# Patient Record
Sex: Female | Born: 1935 | Race: White | Hispanic: No | Smoking: Former smoker
Health system: Southern US, Community
[De-identification: ages and names within clinical notes are randomized; demographics above are authoritative.]

## PROBLEM LIST (undated history)

## (undated) DIAGNOSIS — K8020 Calculus of gallbladder without cholecystitis without obstruction: Secondary | ICD-10-CM

## (undated) DIAGNOSIS — K5792 Diverticulitis of intestine, part unspecified, without perforation or abscess without bleeding: Secondary | ICD-10-CM

## (undated) DIAGNOSIS — D519 Vitamin B12 deficiency anemia, unspecified: Secondary | ICD-10-CM

## (undated) DIAGNOSIS — G479 Sleep disorder, unspecified: Secondary | ICD-10-CM

## (undated) DIAGNOSIS — K449 Diaphragmatic hernia without obstruction or gangrene: Secondary | ICD-10-CM

## (undated) DIAGNOSIS — M1990 Unspecified osteoarthritis, unspecified site: Secondary | ICD-10-CM

## (undated) DIAGNOSIS — E785 Hyperlipidemia, unspecified: Secondary | ICD-10-CM

## (undated) DIAGNOSIS — K635 Polyp of colon: Secondary | ICD-10-CM

## (undated) DIAGNOSIS — G629 Polyneuropathy, unspecified: Secondary | ICD-10-CM

## (undated) HISTORY — DX: Diaphragmatic hernia without obstruction or gangrene: K44.9

## (undated) HISTORY — DX: Hyperlipidemia, unspecified: E78.5

## (undated) HISTORY — DX: Polyp of colon: K63.5

## (undated) HISTORY — DX: Diverticulitis of intestine, part unspecified, without perforation or abscess without bleeding: K57.92

## (undated) HISTORY — DX: Unspecified osteoarthritis, unspecified site: M19.90

## (undated) HISTORY — DX: Calculus of gallbladder without cholecystitis without obstruction: K80.20

## (undated) HISTORY — PX: CATARACT EXTRACTION: SUR2

## (undated) HISTORY — PX: BELPHAROPTOSIS REPAIR: SHX369

## (undated) HISTORY — DX: Sleep disorder, unspecified: G47.9

## (undated) HISTORY — DX: Polyneuropathy, unspecified: G62.9

## (undated) HISTORY — DX: Vitamin B12 deficiency anemia, unspecified: D51.9

## (undated) HISTORY — PX: ABDOMINAL HYSTERECTOMY: SHX81

---

## 1983-09-25 HISTORY — PX: BREAST BIOPSY: SHX20

## 1988-03-26 HISTORY — PX: FOOT SURGERY: SHX648

## 1994-06-26 LAB — CONVERTED CEMR LAB: Pap Smear: NORMAL

## 1996-06-26 DIAGNOSIS — G629 Polyneuropathy, unspecified: Secondary | ICD-10-CM

## 1996-06-26 HISTORY — DX: Polyneuropathy, unspecified: G62.9

## 2001-03-26 LAB — HM COLONOSCOPY: HM Colonoscopy: NORMAL

## 2001-06-26 HISTORY — PX: OTHER SURGICAL HISTORY: SHX169

## 2001-08-09 ENCOUNTER — Encounter: Payer: Self-pay | Admitting: Obstetrics and Gynecology

## 2001-08-12 ENCOUNTER — Encounter: Payer: Self-pay | Admitting: Obstetrics and Gynecology

## 2001-08-12 ENCOUNTER — Ambulatory Visit (HOSPITAL_COMMUNITY): Admission: RE | Admit: 2001-08-12 | Discharge: 2001-08-12 | Payer: Self-pay | Admitting: Obstetrics and Gynecology

## 2001-08-15 ENCOUNTER — Inpatient Hospital Stay (HOSPITAL_COMMUNITY): Admission: RE | Admit: 2001-08-15 | Discharge: 2001-08-17 | Payer: Self-pay | Admitting: Obstetrics and Gynecology

## 2001-08-15 ENCOUNTER — Encounter (INDEPENDENT_AMBULATORY_CARE_PROVIDER_SITE_OTHER): Payer: Self-pay | Admitting: Specialist

## 2005-05-12 ENCOUNTER — Ambulatory Visit: Payer: Self-pay | Admitting: Internal Medicine

## 2005-10-06 ENCOUNTER — Ambulatory Visit: Payer: Self-pay | Admitting: Internal Medicine

## 2005-10-17 ENCOUNTER — Encounter: Admission: RE | Admit: 2005-10-17 | Discharge: 2005-10-17 | Payer: Self-pay | Admitting: Surgery

## 2005-10-24 ENCOUNTER — Ambulatory Visit: Payer: Self-pay | Admitting: Internal Medicine

## 2005-10-30 ENCOUNTER — Ambulatory Visit: Payer: Self-pay

## 2005-11-03 ENCOUNTER — Ambulatory Visit: Payer: Self-pay | Admitting: Internal Medicine

## 2005-12-08 ENCOUNTER — Encounter (INDEPENDENT_AMBULATORY_CARE_PROVIDER_SITE_OTHER): Payer: Self-pay | Admitting: Specialist

## 2005-12-08 ENCOUNTER — Inpatient Hospital Stay (HOSPITAL_COMMUNITY): Admission: RE | Admit: 2005-12-08 | Discharge: 2005-12-08 | Payer: Self-pay | Admitting: Neurological Surgery

## 2006-01-02 ENCOUNTER — Ambulatory Visit: Payer: Self-pay | Admitting: Internal Medicine

## 2006-01-09 ENCOUNTER — Ambulatory Visit: Payer: Self-pay | Admitting: Internal Medicine

## 2006-01-16 ENCOUNTER — Ambulatory Visit: Payer: Self-pay | Admitting: Internal Medicine

## 2006-01-23 ENCOUNTER — Ambulatory Visit: Payer: Self-pay | Admitting: Internal Medicine

## 2006-02-06 ENCOUNTER — Ambulatory Visit: Payer: Self-pay | Admitting: Internal Medicine

## 2006-02-20 ENCOUNTER — Ambulatory Visit: Payer: Self-pay | Admitting: Internal Medicine

## 2006-03-20 ENCOUNTER — Ambulatory Visit: Payer: Self-pay | Admitting: Internal Medicine

## 2006-04-17 ENCOUNTER — Ambulatory Visit: Payer: Self-pay | Admitting: Internal Medicine

## 2006-05-11 ENCOUNTER — Ambulatory Visit: Payer: Self-pay | Admitting: Internal Medicine

## 2006-05-11 LAB — CONVERTED CEMR LAB
Hgb A1c MFr Bld: 5.9 % (ref 4.6–6.0)
Vitamin B-12: >1500 pg/mL — ABNORMAL HIGH (ref 211–911)

## 2006-06-11 ENCOUNTER — Ambulatory Visit: Payer: Self-pay | Admitting: Internal Medicine

## 2006-06-12 ENCOUNTER — Ambulatory Visit: Payer: Self-pay | Admitting: Internal Medicine

## 2006-06-26 LAB — CONVERTED CEMR LAB: Pap Smear: NORMAL

## 2006-07-10 ENCOUNTER — Ambulatory Visit: Payer: Self-pay | Admitting: Internal Medicine

## 2006-08-07 ENCOUNTER — Ambulatory Visit: Payer: Self-pay | Admitting: Internal Medicine

## 2006-10-09 ENCOUNTER — Ambulatory Visit: Payer: Self-pay | Admitting: Internal Medicine

## 2006-10-09 LAB — CONVERTED CEMR LAB
BUN: 14 mg/dL (ref 6–23)
CO2: 29 meq/L (ref 19–32)
Calcium: 9.5 mg/dL (ref 8.4–10.5)
Chloride: 109 meq/L (ref 96–112)
Cholesterol: 173 mg/dL (ref 0–200)
Creatinine, Ser: 0.8 mg/dL (ref 0.4–1.2)
Direct LDL: 99.2 mg/dL
GFR calc Af Amer: 91 mL/min
GFR calc non Af Amer: 75 mL/min
Glucose, Bld: 116 mg/dL — ABNORMAL HIGH (ref 70–99)
HDL: 39.8 mg/dL (ref >39.0)
Hemoglobin: 13.9 g/dL (ref 12.0–15.0)
Hgb A1c MFr Bld: 6.6 % — ABNORMAL HIGH (ref 4.6–6.0)
Potassium: 4.7 meq/L (ref 3.5–5.1)
Sed Rate: 10 mm/hr (ref 0–25)
Sodium: 142 meq/L (ref 135–145)
Total CHOL/HDL Ratio: 4.3
Triglycerides: 215 mg/dL (ref 0–149)
VLDL: 43 mg/dL — ABNORMAL HIGH (ref 0–40)

## 2006-11-06 ENCOUNTER — Ambulatory Visit: Payer: Self-pay | Admitting: Internal Medicine

## 2006-12-04 ENCOUNTER — Ambulatory Visit: Payer: Self-pay | Admitting: Internal Medicine

## 2007-01-01 ENCOUNTER — Ambulatory Visit: Payer: Self-pay | Admitting: Internal Medicine

## 2007-02-06 ENCOUNTER — Ambulatory Visit: Payer: Self-pay | Admitting: Internal Medicine

## 2007-02-06 DIAGNOSIS — G4700 Insomnia, unspecified: Secondary | ICD-10-CM

## 2007-02-06 DIAGNOSIS — E119 Type 2 diabetes mellitus without complications: Secondary | ICD-10-CM

## 2007-02-08 ENCOUNTER — Encounter (INDEPENDENT_AMBULATORY_CARE_PROVIDER_SITE_OTHER): Payer: Self-pay | Admitting: *Deleted

## 2007-02-08 LAB — CONVERTED CEMR LAB
Creatinine,U: 33.4 mg/dL
Hgb A1c MFr Bld: 6.3 % — ABNORMAL HIGH (ref 4.6–6.0)
Microalb Creat Ratio: 6 mg/g (ref 0.0–30.0)
Microalb, Ur: 0.2 mg/dL (ref 0.0–1.9)

## 2007-03-06 ENCOUNTER — Ambulatory Visit: Payer: Self-pay | Admitting: Internal Medicine

## 2007-04-03 ENCOUNTER — Ambulatory Visit: Payer: Self-pay | Admitting: Internal Medicine

## 2007-04-22 ENCOUNTER — Encounter: Payer: Self-pay | Admitting: Internal Medicine

## 2007-05-03 ENCOUNTER — Ambulatory Visit: Payer: Self-pay | Admitting: Internal Medicine

## 2007-05-15 ENCOUNTER — Ambulatory Visit: Payer: Self-pay | Admitting: Internal Medicine

## 2007-05-16 LAB — CONVERTED CEMR LAB
Cholesterol: 193 mg/dL (ref 0–200)
Direct LDL: 114.1 mg/dL
HDL: 39.5 mg/dL (ref >39.0)
Hgb A1c MFr Bld: 6.4 % — ABNORMAL HIGH (ref 4.6–6.0)
Total CHOL/HDL Ratio: 4.9
Triglycerides: 226 mg/dL (ref 0–149)
VLDL: 45 mg/dL — ABNORMAL HIGH (ref 0–40)

## 2007-05-31 ENCOUNTER — Ambulatory Visit: Payer: Self-pay | Admitting: Internal Medicine

## 2007-07-03 ENCOUNTER — Ambulatory Visit: Payer: Self-pay | Admitting: Internal Medicine

## 2007-07-29 ENCOUNTER — Ambulatory Visit: Payer: Self-pay | Admitting: Internal Medicine

## 2007-07-30 ENCOUNTER — Encounter: Payer: Self-pay | Admitting: Internal Medicine

## 2007-08-20 ENCOUNTER — Ambulatory Visit: Payer: Self-pay | Admitting: Internal Medicine

## 2007-08-20 DIAGNOSIS — G629 Polyneuropathy, unspecified: Secondary | ICD-10-CM

## 2007-08-20 DIAGNOSIS — M1990 Unspecified osteoarthritis, unspecified site: Secondary | ICD-10-CM | POA: Insufficient documentation

## 2007-08-20 DIAGNOSIS — D518 Other vitamin B12 deficiency anemias: Secondary | ICD-10-CM

## 2007-08-20 DIAGNOSIS — K5900 Constipation, unspecified: Secondary | ICD-10-CM | POA: Insufficient documentation

## 2007-08-22 LAB — CONVERTED CEMR LAB
AST: 23 units/L (ref 0–37)
BUN: 16 mg/dL (ref 6–23)
Basophils Absolute: 0.1 10*3/uL (ref 0.0–0.1)
CO2: 31 meq/L (ref 19–32)
Cholesterol: 204 mg/dL (ref 0–200)
Eosinophils Absolute: 0.2 10*3/uL (ref 0.0–0.6)
Folate: >20 ng/mL
GFR calc Af Amer: 90 mL/min
HDL: 39.1 mg/dL (ref >39.0)
MCHC: 31.9 g/dL (ref 30.0–36.0)
MCV: 86.3 fL (ref 78.0–100.0)
Microalb, Ur: 0.2 mg/dL (ref 0.0–1.9)
Monocytes Relative: 8.7 % (ref 3.0–11.0)
Neutrophils Relative %: 65.8 % (ref 43.0–77.0)
Platelets: 226 10*3/uL (ref 150–400)
Potassium: 4.2 meq/L (ref 3.5–5.1)
RBC: 4.98 M/uL (ref 3.87–5.11)
Triglycerides: 262 mg/dL (ref 0–149)

## 2007-08-27 ENCOUNTER — Ambulatory Visit: Payer: Self-pay | Admitting: Internal Medicine

## 2007-10-08 ENCOUNTER — Ambulatory Visit: Payer: Self-pay | Admitting: Internal Medicine

## 2007-11-05 ENCOUNTER — Ambulatory Visit: Payer: Self-pay | Admitting: Internal Medicine

## 2007-11-13 ENCOUNTER — Ambulatory Visit: Payer: Self-pay | Admitting: Internal Medicine

## 2007-11-15 ENCOUNTER — Telehealth (INDEPENDENT_AMBULATORY_CARE_PROVIDER_SITE_OTHER): Payer: Self-pay | Admitting: *Deleted

## 2007-12-18 ENCOUNTER — Ambulatory Visit: Payer: Self-pay | Admitting: Internal Medicine

## 2007-12-23 ENCOUNTER — Telehealth (INDEPENDENT_AMBULATORY_CARE_PROVIDER_SITE_OTHER): Payer: Self-pay | Admitting: *Deleted

## 2007-12-23 LAB — CONVERTED CEMR LAB
Cholesterol: 224 mg/dL (ref 0–200)
Total CHOL/HDL Ratio: 6
Triglycerides: 173 mg/dL — ABNORMAL HIGH (ref 0–149)

## 2008-01-15 ENCOUNTER — Ambulatory Visit: Payer: Self-pay | Admitting: Internal Medicine

## 2008-02-20 ENCOUNTER — Ambulatory Visit: Payer: Self-pay | Admitting: Internal Medicine

## 2008-03-16 ENCOUNTER — Ambulatory Visit: Payer: Self-pay | Admitting: Internal Medicine

## 2008-03-26 LAB — HM MAMMOGRAPHY: HM Mammogram: NORMAL

## 2008-04-22 ENCOUNTER — Ambulatory Visit: Payer: Self-pay | Admitting: Internal Medicine

## 2008-04-22 DIAGNOSIS — E785 Hyperlipidemia, unspecified: Secondary | ICD-10-CM

## 2008-04-28 ENCOUNTER — Telehealth (INDEPENDENT_AMBULATORY_CARE_PROVIDER_SITE_OTHER): Payer: Self-pay | Admitting: *Deleted

## 2008-04-28 LAB — CONVERTED CEMR LAB
ALT: 19 U/L (ref 0–35)
AST: 21 U/L (ref 0–37)
Cholesterol: 142 mg/dL (ref 0–200)
Folate: 19.6 ng/mL
HDL: 35.5 mg/dL — ABNORMAL LOW (ref >39.0)
Hgb A1c MFr Bld: 6.8 % — ABNORMAL HIGH (ref 4.6–6.0)
LDL Cholesterol: 69 mg/dL (ref 0–99)
Total CHOL/HDL Ratio: 4
Triglycerides: 187 mg/dL — ABNORMAL HIGH (ref 0–149)
VLDL: 37 mg/dL (ref 0–40)
Vitamin B-12: >1500 pg/mL — ABNORMAL HIGH (ref 211–911)

## 2008-05-14 ENCOUNTER — Encounter: Payer: Self-pay | Admitting: Internal Medicine

## 2008-05-28 ENCOUNTER — Ambulatory Visit: Payer: Self-pay | Admitting: Internal Medicine

## 2008-06-16 ENCOUNTER — Encounter: Payer: Self-pay | Admitting: Internal Medicine

## 2008-06-23 ENCOUNTER — Ambulatory Visit: Payer: Self-pay | Admitting: Internal Medicine

## 2008-07-21 ENCOUNTER — Ambulatory Visit: Payer: Self-pay | Admitting: Internal Medicine

## 2008-08-19 ENCOUNTER — Encounter: Payer: Self-pay | Admitting: Internal Medicine

## 2008-08-20 ENCOUNTER — Ambulatory Visit: Payer: Self-pay | Admitting: Internal Medicine

## 2008-08-25 ENCOUNTER — Telehealth (INDEPENDENT_AMBULATORY_CARE_PROVIDER_SITE_OTHER): Payer: Self-pay | Admitting: *Deleted

## 2008-08-25 LAB — CONVERTED CEMR LAB
ALT: 27 units/L (ref 0–35)
BUN: 13 mg/dL (ref 6–23)
CO2: 27 meq/L (ref 19–32)
Chloride: 107 meq/L (ref 96–112)
Glucose, Bld: 147 mg/dL — ABNORMAL HIGH (ref 70–99)
Hgb A1c MFr Bld: 6.9 % — ABNORMAL HIGH (ref 4.6–6.0)
Potassium: 4 meq/L (ref 3.5–5.1)

## 2008-09-17 ENCOUNTER — Ambulatory Visit: Payer: Self-pay | Admitting: Internal Medicine

## 2008-10-13 ENCOUNTER — Ambulatory Visit: Payer: Self-pay | Admitting: Internal Medicine

## 2008-12-22 ENCOUNTER — Ambulatory Visit: Payer: Self-pay | Admitting: Internal Medicine

## 2008-12-22 DIAGNOSIS — D239 Other benign neoplasm of skin, unspecified: Secondary | ICD-10-CM | POA: Insufficient documentation

## 2008-12-25 LAB — CONVERTED CEMR LAB
Basophils Relative: 0.2 % (ref 0.0–3.0)
Eosinophils Absolute: 0.1 10*3/uL (ref 0.0–0.7)
Eosinophils Relative: 1.6 % (ref 0.0–5.0)
LDL Cholesterol: 59 mg/dL (ref 0–99)
Lymphocytes Relative: 20.7 % (ref 12.0–46.0)
MCHC: 33.8 g/dL (ref 30.0–36.0)
Neutrophils Relative %: 68.3 % (ref 43.0–77.0)
Platelets: 174 10*3/uL (ref 150.0–400.0)
RBC: 4.73 M/uL (ref 3.87–5.11)
Total CHOL/HDL Ratio: 4
VLDL: 38.6 mg/dL (ref 0.0–40.0)
WBC: 5.3 10*3/uL (ref 4.5–10.5)

## 2009-01-06 ENCOUNTER — Ambulatory Visit: Payer: Self-pay | Admitting: Internal Medicine

## 2009-01-06 ENCOUNTER — Encounter: Payer: Self-pay | Admitting: Internal Medicine

## 2009-01-19 ENCOUNTER — Ambulatory Visit: Payer: Self-pay | Admitting: Internal Medicine

## 2009-02-11 ENCOUNTER — Encounter (INDEPENDENT_AMBULATORY_CARE_PROVIDER_SITE_OTHER): Payer: Self-pay | Admitting: *Deleted

## 2009-02-11 ENCOUNTER — Ambulatory Visit: Payer: Self-pay | Admitting: Internal Medicine

## 2009-04-14 ENCOUNTER — Ambulatory Visit: Payer: Self-pay | Admitting: Internal Medicine

## 2009-05-11 ENCOUNTER — Ambulatory Visit: Payer: Self-pay | Admitting: Internal Medicine

## 2009-05-25 ENCOUNTER — Ambulatory Visit: Payer: Self-pay | Admitting: Internal Medicine

## 2009-05-27 LAB — CONVERTED CEMR LAB
BUN: 12 mg/dL (ref 6–23)
Chloride: 105 meq/L (ref 96–112)
Creatinine, Ser: 0.7 mg/dL (ref 0.4–1.2)
Folate: >20 ng/mL
GFR calc non Af Amer: 86.57 mL/min (ref >60)
Hgb A1c MFr Bld: 6.9 % — ABNORMAL HIGH (ref 4.6–6.5)
Vitamin B-12: 416 pg/mL (ref 211–911)

## 2009-06-08 ENCOUNTER — Ambulatory Visit: Payer: Self-pay | Admitting: Internal Medicine

## 2009-07-09 ENCOUNTER — Ambulatory Visit: Payer: Self-pay | Admitting: Internal Medicine

## 2009-08-10 ENCOUNTER — Ambulatory Visit: Payer: Self-pay | Admitting: Internal Medicine

## 2009-09-07 ENCOUNTER — Ambulatory Visit: Payer: Self-pay | Admitting: Internal Medicine

## 2009-10-12 ENCOUNTER — Ambulatory Visit: Payer: Self-pay | Admitting: Internal Medicine

## 2009-10-19 LAB — CONVERTED CEMR LAB
ALT: 18 units/L (ref 0–35)
AST: 23 units/L (ref 0–37)
Cholesterol: 159 mg/dL (ref 0–200)
Creatinine,U: 112.5 mg/dL
Direct LDL: 79.2 mg/dL
Microalb Creat Ratio: 6.2 mg/g (ref 0.0–30.0)
TSH: 1.5 microintl units/mL (ref 0.35–5.50)
Total CHOL/HDL Ratio: 3
Triglycerides: 256 mg/dL — ABNORMAL HIGH (ref 0.0–149.0)

## 2009-11-15 ENCOUNTER — Ambulatory Visit: Payer: Self-pay | Admitting: Internal Medicine

## 2009-12-07 ENCOUNTER — Encounter: Payer: Self-pay | Admitting: Internal Medicine

## 2009-12-21 ENCOUNTER — Ambulatory Visit: Payer: Self-pay | Admitting: Internal Medicine

## 2010-01-25 ENCOUNTER — Ambulatory Visit: Payer: Self-pay | Admitting: Internal Medicine

## 2010-02-16 ENCOUNTER — Ambulatory Visit: Payer: Self-pay | Admitting: Internal Medicine

## 2010-02-21 LAB — CONVERTED CEMR LAB
BUN: 12 mg/dL (ref 6–23)
Basophils Absolute: 0 10*3/uL (ref 0.0–0.1)
Calcium: 9.1 mg/dL (ref 8.4–10.5)
Eosinophils Relative: 3.2 % (ref 0.0–5.0)
Folate: 19.7 ng/mL
GFR calc non Af Amer: 107.34 mL/min (ref >60)
Glucose, Bld: 121 mg/dL — ABNORMAL HIGH (ref 70–99)
Hgb A1c MFr Bld: 6.9 % — ABNORMAL HIGH (ref 4.6–6.5)
Lymphocytes Relative: 21.5 % (ref 12.0–46.0)
Monocytes Relative: 8.2 % (ref 3.0–12.0)
Neutrophils Relative %: 66.8 % (ref 43.0–77.0)
Platelets: 174 10*3/uL (ref 150.0–400.0)
RDW: 14.6 % (ref 11.5–14.6)
Vitamin B-12: 427 pg/mL (ref 211–911)
WBC: 4.9 10*3/uL (ref 4.5–10.5)

## 2010-03-08 ENCOUNTER — Ambulatory Visit: Payer: Self-pay | Admitting: Internal Medicine

## 2010-03-08 DIAGNOSIS — R197 Diarrhea, unspecified: Secondary | ICD-10-CM

## 2010-03-08 LAB — CONVERTED CEMR LAB
Glucose, Urine, Semiquant: NEGATIVE
Protein, U semiquant: NEGATIVE
Specific Gravity, Urine: 1.025
WBC Urine, dipstick: NEGATIVE
pH: 6.5

## 2010-04-13 ENCOUNTER — Ambulatory Visit: Payer: Self-pay | Admitting: Internal Medicine

## 2010-05-12 ENCOUNTER — Ambulatory Visit: Payer: Self-pay | Admitting: Internal Medicine

## 2010-05-31 ENCOUNTER — Encounter: Payer: Self-pay | Admitting: Internal Medicine

## 2010-06-13 ENCOUNTER — Ambulatory Visit: Payer: Self-pay | Admitting: Internal Medicine

## 2010-07-26 ENCOUNTER — Ambulatory Visit
Admission: RE | Admit: 2010-07-26 | Discharge: 2010-07-26 | Payer: Self-pay | Source: Home / Self Care | Attending: Internal Medicine | Admitting: Internal Medicine

## 2010-07-26 ENCOUNTER — Other Ambulatory Visit: Payer: Self-pay | Admitting: Internal Medicine

## 2010-07-26 ENCOUNTER — Encounter: Payer: Self-pay | Admitting: Internal Medicine

## 2010-07-26 LAB — CONVERTED CEMR LAB: Vit D, 25-Hydroxy: 60 ng/mL (ref 30–89)

## 2010-07-26 NOTE — Assessment & Plan Note (Signed)
Summary: 4 month roa//lch  resch/cbs   Vital Signs:  Patient profile:   77 year old female Weight:      158.13 pounds Pulse rate:   83 / minute Pulse rhythm:   regular BP sitting:   132 / 82  (left arm) Cuff size:   regular  Vitals Entered By: Army Fossa CMA (February 16, 2010 10:42 AM) CC: F/u visit: fasting    History of Present Illness: ROV AODM -- diet only, ambulatory CBGs in AM 130 to 150, rest of the day CBGs are 100, rarely more    NEUROPATHY --pt declined a nerve BX, had a NCS last week. neurology  refered to PT to use a cane and work in balance . on vicodin 2/day on average   Hyperlipidemia-- good medication compliance , needs Rx faxed to [facility]  Current Medications (verified): 1)  Provigil 200 Mg  Tabs (Modafinil) .Marland Kitchen.. 1 By Mouth Qam 2)  Cymbalta 60 Mg Cpep (Duloxetine Hcl) .... Take 2 Capsule By Mouth Once A Day 3)  Ambien 10 Mg Tabs (Zolpidem Tartrate) .... 1/2 Qhs 4)  Neurontin 600 Mg Tabs (Gabapentin) .... Qid (Rx'd By Neuro) 5)  Bayer Aspirin 325 Mg Tabs (Aspirin) .... Take 1 Tablet By Mouth Once A Day 6)  Hydrocodone-Acetaminophen 5-500 Mg  Tabs (Hydrocodone-Acetaminophen) .... Qhs 7)  Mvi - Calcium 8)  B 12 Shots Monthly 9)  Test Strips .... Checks Bs 1x Once Daily Dx 250.00 10)  Pravachol 20 Mg  Tabs (Pravastatin Sodium) .Marland Kitchen.. 1 By Mouth Qhs  Allergies: 1)  ! Sulfa  Past History:  Past Medical History: DISTURBANCE, SLEEP ----> Narcolepsy AODM  B 12 def  ANEMIA NEUROPATHY Dx 1998, not related to DM, w/u neg (idiopatic Progressive Neuropathy) ---f/u [facility]  2005: (-) CAROTID U/S AND CARDIOLITE Osteoarthritis Hyperlipidemia, started meds 6-09 DEXA normal 7-10   Past Surgical History: Reviewed history from 12/22/2008 and no changes required. Hysterectomy, no oophorectomy  L cataract surgery 9-09 R cataract surgery 3/10  Social History: Reviewed history from 10/12/2009 and no changes required. Married x 56 years children x  2 Former Smoker Alcohol use-yes wine 3-4x/wk Drug use-no Regular exercise-- increasingly diff. to exercise d/t lack of balance (neuropathy) caffeine - 4 cups once daily still drives  eats healthy  Review of Systems CV:  Denies chest pain or discomfort; occasionally ankle edema . Resp:  Denies cough and shortness of breath. GI:  Denies bloody stools, diarrhea, nausea, and vomiting. MS:  h/o DJD, R hand pain . Marland Kitchen  Physical Exam  General:  alert, well-developed, and well-nourished.   Lungs:  normal respiratory effort, no intercostal retractions, no accessory muscle use, and normal breath sounds.   Heart:  normal rate, regular rhythm, no murmur, and no gallop.   Extremities:  no pretibial edema bilaterally  hand and wrist is without puffiness. Fingers with deformities consistent with DJD Psych:  Oriented X3, memory intact for recent and remote, normally interactive, good eye contact, not anxious appearing, and not depressed appearing.     Impression & Recommendations:  Problem # 1:  HYPERLIPIDEMIA (ICD-272.4) well-controlled except for triglycerides, recommend diet Her updated medication list for this problem includes:    Pravachol 20 Mg Tabs (Pravastatin sodium) .Marland Kitchen... 1 by mouth qhs  Labs Reviewed: SGOT: 23 (10/12/2009)   SGPT: 18 (10/12/2009)   HDL:46.70 (10/12/2009), 36.10 (12/22/2008)  LDL:59 (12/22/2008), 69 (04/22/2008)  Chol:159 (10/12/2009), 134 (12/22/2008)  Trig:256.0 (10/12/2009), 193.0 (12/22/2008)  Problem # 2:  NEUROPATHY (ICD-355.9) pt declined  a BX, had a NCS last week. neurology  refered to PT to use a cane and work in balance . on vicodin 2/day on average   Problem # 3:  ANEMIA, B12 DEFICIENCY (ICD-281.1) labs Orders: TLB-CBC Platelet - w/Differential (85025-CBCD) TLB-B12 + Folate Pnl (14782_95621-H08/MVH) Specimen Handling (84696)  Hgb: 13.8 (12/22/2008)   Hct: 40.7 (12/22/2008)   Platelets: 174.0 (12/22/2008) RBC: 4.73 (12/22/2008)   RDW: 13.6  (12/22/2008)   WBC: 5.3 (12/22/2008) MCV: 86.0 (12/22/2008)   MCHC: 33.8 (12/22/2008) B12: 416 (05/25/2009)   Folate: >20.0 ng/mL (05/25/2009)   TSH: 1.50 (10/12/2009)  Problem # 4:  AODM (ICD-250.00) on diet only Her updated medication list for this problem includes:    Bayer Aspirin 325 Mg Tabs (Aspirin) .Marland Kitchen... Take 1 tablet by mouth once a day  Orders: Venipuncture (29528) TLB-BMP (Basic Metabolic Panel-BMET) (80048-METABOL) TLB-A1C / Hgb A1C (Glycohemoglobin) (83036-A1C) Specimen Handling (41324)  Labs Reviewed: Creat: 0.7 (05/25/2009)    Reviewed HgBA1c results: 6.6 (10/12/2009)  6.9 (05/25/2009)  Problem # 5:  like to have her physical exam on  return to the office  Complete Medication List: 1)  Provigil 200 Mg Tabs (Modafinil) .Marland Kitchen.. 1 by mouth qam 2)  Cymbalta 60 Mg Cpep (Duloxetine hcl) .... Take 2 capsule by mouth once a day 3)  Ambien 10 Mg Tabs (Zolpidem tartrate) .... 1/2 qhs 4)  Neurontin 600 Mg Tabs (Gabapentin) .... Qid (rx'd by neuro) 5)  Bayer Aspirin 325 Mg Tabs (Aspirin) .... Take 1 tablet by mouth once a day 6)  Hydrocodone-acetaminophen 5-500 Mg Tabs (Hydrocodone-acetaminophen) .... Qhs 7)  Mvi - Calcium  8)  B 12 Shots Monthly  9)  Test Strips  .... Checks bs 1x once daily dx 250.00 10)  Pravachol 20 Mg Tabs (Pravastatin sodium) .Marland Kitchen.. 1 by mouth qhs  Patient Instructions: 1)  Please schedule a follow-up appointment in 4 to 6 months , fasting, physical exam Prescriptions: PRAVACHOL 20 MG  TABS (PRAVASTATIN SODIUM) 1 by mouth qhs  #90 x 3   Entered by:   Army Fossa CMA   Authorized by:   Nolon Rod. Shawon Denzer MD   Signed by:   Army Fossa CMA on 02/16/2010   Method used:   Faxed to ...       [facility] (mail-order)             , [place]         Ph: [phone]       Fax: [phone]   RxID:   [id number]

## 2010-07-26 NOTE — Assessment & Plan Note (Signed)
Summary: b12 inj//lch  Nurse Visit   Allergies: 1)  ! Sulfa  Medication Administration  Injection # 1:    Medication: Vit B12 1000 mcg    Diagnosis: ANEMIA, B12 DEFICIENCY (ICD-281.1)    Route: IM    Site: L deltoid    Exp Date: 01/25/2011    Lot #: [id number]    Mfr: American Regent    Given by: Doristine Devoid (September 07, 2009 11:07 AM)  Orders Added: 1)  Vit B12 1000 mcg [J3420] 2)  Admin of Therapeutic Inj  intramuscular or subcutaneous [96372]   Medication Administration  Injection # 1:    Medication: Vit B12 1000 mcg    Diagnosis: ANEMIA, B12 DEFICIENCY (ICD-281.1)    Route: IM    Site: L deltoid    Exp Date: 01/25/2011    Lot #: [id number]    Mfr: American Regent    Given by: Doristine Devoid (September 07, 2009 11:07 AM)  Orders Added: 1)  Vit B12 1000 mcg [J3420] 2)  Admin of Therapeutic Inj  intramuscular or subcutaneous [53664]

## 2010-07-26 NOTE — Assessment & Plan Note (Signed)
Summary: 5 mth fu/ns/kdc//b12//lch   Vital Signs:  Patient profile:   77 year old female Height:      66.75 inches Weight:      157.6 pounds BMI:     24.96 Pulse rate:   72 / minute BP sitting:   120 / 60  Vitals Entered By: Shary Decamp (October 12, 2009 10:17 AM) CC: rov - fasting Comments c/o of losing strength in her arms Shary Decamp  October 12, 2009 10:20 AM    History of Present Illness:  rov - fasting c/o of losing strength in her arms, wonders if related to peripheral neuropathy   AODM -- check ambulatory CBGs 3/week : AM 150s, before dinner in the low 120s   B 12 def  ANEMIA-- good medication compliance w/  shots  Osteoarthritis-- not really an issue at this point   Hyperlipidemia-- good medication compliance w/  pravachol, no apparent s/e   Current Medications (verified): 1)  Provigil 200 Mg  Tabs (Modafinil) .Marland Kitchen.. 1 By Mouth Qam 2)  Cymbalta 60 Mg Cpep (Duloxetine Hcl) .... Take 2 Capsule By Mouth Once A Day 3)  Ambien 10 Mg Tabs (Zolpidem Tartrate) .... 1/2 Qhs 4)  Neurontin 600 Mg Tabs (Gabapentin) .... Qid (Rx'd By Neuro) 5)  Bayer Aspirin 325 Mg Tabs (Aspirin) .... Take 1 Tablet By Mouth Once A Day 6)  Hydrocodone-Acetaminophen 5-500 Mg  Tabs (Hydrocodone-Acetaminophen) .... Qhs 7)  Mvi - Calcium 8)  B 12 Shots Monthly 9)  Test Strips .... Checks Bs 1x Once Daily Dx 250.00 10)  Pravachol 20 Mg  Tabs (Pravastatin Sodium) .Marland Kitchen.. 1 By Mouth Qhs  Allergies (verified): 1)  ! Sulfa  Past History:  Past Medical History: DISTURBANCE, SLEEP ----> Narcolepsy AODM  B 12 def  ANEMIA NEUROPATHY Dx 1998, not related to DM, w/u neg (idiopatic Progressive Neuropathy) ---f/u [facility]  2005: (-) CAROTID U/S AND CARDIOLITE Osteoarthritis Hyperlipidemia, started meds 6-09  Past Surgical History: Reviewed history from 12/22/2008 and no changes required. Hysterectomy, no oophorectomy  L cataract surgery 9-09 R cataract surgery 3/10  Social  History: Married x 56 years children x 2 Former Smoker Alcohol use-yes wine 3-4x/wk Drug use-no Regular exercise-- increasingly diff. to exercise d/t lack of balance (neuropathy) caffeine - 4 cups once daily still drives  eats healthy  Review of Systems CV:  Denies chest pain or discomfort and swelling of feet. Resp:  Denies cough and shortness of breath. GI:  Denies bloody stools, diarrhea, nausea, and vomiting. MS:  Denies muscle aches. Neuro:  neuropathic pain remains her main concern. Psych:  Denies anxiety; occasionally feels down, mostly due to her neuropathy .  Physical Exam  General:  alert, well-developed, and well-nourished.   Lungs:  normal respiratory effort, no intercostal retractions, no accessory muscle use, and normal breath sounds.   Heart:  Normal rate and regular rhythm. S1 and S2 normal without gallop  Extremities:  no edema Psych:  not anxious appearing and not depressed appearing.     Impression & Recommendations:  Problem # 1:  HYPERLIPIDEMIA (ICD-272.4) due for labs Her updated medication list for this problem includes:    Pravachol 20 Mg Tabs (Pravastatin sodium) .Marland Kitchen... 1 by mouth qhs  Orders: TLB-Lipid Panel (80061-LIPID) TLB-ALT (SGPT) (84460-ALT) TLB-AST (SGOT) (84450-SGOT)  Labs Reviewed: SGOT: 24 (12/22/2008)   SGPT: 19 (12/22/2008)   HDL:36.10 (12/22/2008), 35.5 (04/22/2008)  LDL:59 (12/22/2008), 69 (04/22/2008)  Chol:134 (12/22/2008), 142 (04/22/2008)  Trig:193.0 (12/22/2008), 187 (04/22/2008)  Problem # 2:  ANEMIA, B12 DEFICIENCY (ICD-281.1) good compliance with shots Orders: Admin of Therapeutic Inj  intramuscular or subcutaneous (45409) Vit B12 1000 mcg (J3420)  Hgb: 13.8 (12/22/2008)   Hct: 40.7 (12/22/2008)   Platelets: 174.0 (12/22/2008) RBC: 4.73 (12/22/2008)   RDW: 13.6 (12/22/2008)   WBC: 5.3 (12/22/2008) MCV: 86.0 (12/22/2008)   MCHC: 33.8 (12/22/2008) B12: 416 (05/25/2009)   Folate: >20.0 ng/mL (05/25/2009)   TSH: 1.37  (08/20/2007)  Problem # 3:  AODM (ICD-250.00) on no medication, labs diet is healthy, exercise limited by neuropathy Her updated medication list for this problem includes:    Bayer Aspirin 325 Mg Tabs (Aspirin) .Marland Kitchen... Take 1 tablet by mouth once a day  Orders: Venipuncture (81191) TLB-A1C / Hgb A1C (Glycohemoglobin) (83036-A1C) TLB-Microalbumin/Creat Ratio, Urine (82043-MALB)  Labs Reviewed: Creat: 0.7 (05/25/2009)    Reviewed HgBA1c results: 6.9 (05/25/2009)  6.9 (12/22/2008)  Problem # 4:  NEUROPATHY (ICD-355.9) long history of idiopathic neuropathy, symptoms are mostly deep ache and burning in her legs under the care of Dr. Anne Hahn in [facility], she is her primary neurologist, she has seen also a sub-specialties in [place]  they are thinking about nerve biopsy, patient not sure. encourage to use more Vicodin, she takes maybe one a day, I don't see any objection to take one every 6 hours as needed as long as she is not drowsy.  This is  mostly to improve her quantity of life Orders: TLB-TSH (Thyroid Stimulating Hormone) (84443-TSH)  Complete Medication List: 1)  Provigil 200 Mg Tabs (Modafinil) .Marland Kitchen.. 1 by mouth qam 2)  Cymbalta 60 Mg Cpep (Duloxetine hcl) .... Take 2 capsule by mouth once a day 3)  Ambien 10 Mg Tabs (Zolpidem tartrate) .... 1/2 qhs 4)  Neurontin 600 Mg Tabs (Gabapentin) .... Qid (rx'd by neuro) 5)  Bayer Aspirin 325 Mg Tabs (Aspirin) .... Take 1 tablet by mouth once a day 6)  Hydrocodone-acetaminophen 5-500 Mg Tabs (Hydrocodone-acetaminophen) .... Qhs 7)  Mvi - Calcium  8)  B 12 Shots Monthly  9)  Test Strips  .... Checks bs 1x once daily dx 250.00 10)  Pravachol 20 Mg Tabs (Pravastatin sodium) .Marland Kitchen.. 1 by mouth qhs  Patient Instructions: 1)  Please schedule a follow-up appointment in 4 months  (yearly checkup)   Medication Administration  Injection # 1:    Medication: Vit B12 1000 mcg    Diagnosis: ANEMIA, B12 DEFICIENCY (ICD-281.1)    Route: IM     Site: L deltoid    Exp Date: 07/2011    Lot #: [id number]    Patient tolerated injection without complications    Given by: Shary Decamp (October 12, 2009 10:28 AM)  Orders Added: 1)  Admin of Therapeutic Inj  intramuscular or subcutaneous [96372] 2)  Vit B12 1000 mcg [J3420] 3)  Venipuncture [36415] 4)  TLB-Lipid Panel [80061-LIPID] 5)  TLB-A1C / Hgb A1C (Glycohemoglobin) [83036-A1C] 6)  TLB-Microalbumin/Creat Ratio, Urine [82043-MALB] 7)  TLB-TSH (Thyroid Stimulating Hormone) [84443-TSH] 8)  TLB-ALT (SGPT) [84460-ALT] 9)  TLB-AST (SGOT) [84450-SGOT] 10)  Est. Patient Level IV [47829]

## 2010-07-26 NOTE — Assessment & Plan Note (Signed)
Summary: B12//KN  Nurse Visit   Allergies: 1)  ! Sulfa  Medication Administration  Injection # 1:    Medication: Vit B12 1000 mcg    Diagnosis: ANEMIA, B12 DEFICIENCY (ICD-281.1)    Route: IM    Site: L deltoid    Exp Date: 08/2011    Lot #: [id number]    Mfr: American Regent    Patient tolerated injection without complications    Given by: Army Fossa CMA (January 25, 2010 11:00 AM)  Orders Added: 1)  Vit B12 1000 mcg [J3420] 2)  Admin of Therapeutic Inj  intramuscular or subcutaneous [65784]

## 2010-07-26 NOTE — Assessment & Plan Note (Signed)
Summary: B-12///SPH  Nurse Visit  CC: B-12 inj./kb   Allergies: 1)  ! Sulfa  Medication Administration  Injection # 1:    Medication: Vit B12 1000 mcg    Diagnosis: ANEMIA, B12 DEFICIENCY (ICD-281.1)    Route: IM    Site: L deltoid    Exp Date: 08/25/2011    Lot #: [id number]    Mfr: American Regent    Patient tolerated injection without complications    Given by: Lucious Groves CMA (May 12, 2010 10:07 AM)  Orders Added: 1)  Vit B12 1000 mcg [J3420] 2)  Admin of Therapeutic Inj  intramuscular or subcutaneous [98119]

## 2010-07-26 NOTE — Assessment & Plan Note (Signed)
Summary: b12 shot/kdc  Nurse Visit   Allergies: 1)  ! Sulfa  Medication Administration  Injection # 1:    Medication: Vit B12 1000 mcg    Diagnosis: ANEMIA, B12 DEFICIENCY (ICD-281.1)    Route: IM    Site: R deltoid    Exp Date: 03/2011    Lot #: [id number]    Mfr: American Regent    Patient tolerated injection without complications    Given by: Floydene Flock (July 09, 2009 11:24 AM)  Orders Added: 1)  Admin of Therapeutic Inj  intramuscular or subcutaneous [96372] 2)  Vit B12 1000 mcg [J3420]   Medication Administration  Injection # 1:    Medication: Vit B12 1000 mcg    Diagnosis: ANEMIA, B12 DEFICIENCY (ICD-281.1)    Route: IM    Site: R deltoid    Exp Date: 03/2011    Lot #: [id number]    Mfr: American Regent    Patient tolerated injection without complications    Given by: Floydene Flock (July 09, 2009 11:24 AM)  Orders Added: 1)  Admin of Therapeutic Inj  intramuscular or subcutaneous [96372] 2)  Vit B12 1000 mcg [J3420]

## 2010-07-26 NOTE — Assessment & Plan Note (Signed)
Summary: Diarrhea/b12inj/drb   Vital Signs:  Patient profile:   77 year old female Weight:      148.13 pounds Temp:     97.8 degrees F oral Pulse rate:   91 / minute Pulse rhythm:   regular BP sitting:   122 / 80  (left arm) Cuff size:   regular  Vitals Entered By: Army Fossa CMA (March 08, 2010 10:09 AM) CC: Diarrhea since last wed. Comments Started last wednesday was out of town Vomitted once this am has lost 10 lbs.    History of Present Illness: 6 days history of watery diarrhea, several episodes a day, no blood noted but some mucus Today for the first time she felt slightly nauseated  and vomited "a tablespoon" of mucus, no blood. Of notice is tha she  went to the [facility] for 10 days, return on September 10. Obviously she ate  different from her usual diet, denies eating sea food or [place] food.   ROS no fever Mild to moderate lower bilateral abdominal discomfort Feels weak particularly when she stands up but no falls She has been forcing herself to eat solids but for the last 2 days she has been able to only drink fluids. Fortunately she is drinking a lot of fluids. No dysuria or gross hematuria no recent antibiotics  Current Medications (verified): 1)  Provigil 200 Mg  Tabs (Modafinil) .Marland Kitchen.. 1 By Mouth Qam 2)  Cymbalta 60 Mg Cpep (Duloxetine Hcl) .... Take 2 Capsule By Mouth Once A Day 3)  Ambien 10 Mg Tabs (Zolpidem Tartrate) .... 1/2 Qhs 4)  Neurontin 600 Mg Tabs (Gabapentin) .... Qid (Rx'd By Neuro) 5)  Bayer Aspirin 325 Mg Tabs (Aspirin) .... Take 1 Tablet By Mouth Once A Day 6)  Hydrocodone-Acetaminophen 5-500 Mg  Tabs (Hydrocodone-Acetaminophen) .... Qhs 7)  Mvi - Calcium 8)  B 12 Shots Monthly 9)  Test Strips .... Checks Bs 1x Once Daily Dx 250.00 10)  Pravachol 20 Mg  Tabs (Pravastatin Sodium) .Marland Kitchen.. 1 By Mouth Qhs  Allergies: 1)  ! Sulfa  Past History:  Past Medical History: Reviewed history from 02/16/2010 and no changes  required. DISTURBANCE, SLEEP ----> Narcolepsy AODM  B 12 def  ANEMIA NEUROPATHY Dx 1998, not related to DM, w/u neg (idiopatic Progressive Neuropathy) ---f/u [facility]  2005: (-) CAROTID U/S AND CARDIOLITE Osteoarthritis Hyperlipidemia, started meds 6-09 DEXA normal 7-10   Past Surgical History: Reviewed history from 12/22/2008 and no changes required. Hysterectomy, no oophorectomy  L cataract surgery 9-09 R cataract surgery 3/10  Social History: Reviewed history from 10/12/2009 and no changes required. Married x 56 years children x 2 Former Smoker Alcohol use-yes wine 3-4x/wk Drug use-no Regular exercise-- increasingly diff. to exercise d/t lack of balance (neuropathy) caffeine - 4 cups once daily still drives  eats healthy  Physical Exam  General:  alert, well-developed, and well-nourished.  known toxic  Mouth:  moderately dry membranes Lungs:  normal respiratory effort, no intercostal retractions, no accessory muscle use, and normal breath sounds.   Heart:  normal rate, regular rhythm, no murmur, and no gallop.   Abdomen:  soft, not distended, mild tenderness without rebound at the lower abdomen bilaterally Msk:  gait is steady and at baseline   Impression & Recommendations:  Problem # 1:  DIARRHEA (ICD-787.91)  acute GI symptoms, likely traveler's diarrhea she likely will benefit from antibiotics for 3 days. see instructions   Orders: UA Dipstick w/o Micro (automated)  (81003)  Complete Medication List:  1)  Provigil 200 Mg Tabs (Modafinil) .Marland Kitchen.. 1 by mouth qam 2)  Cymbalta 60 Mg Cpep (Duloxetine hcl) .... Take 2 capsule by mouth once a day 3)  Ambien 10 Mg Tabs (Zolpidem tartrate) .... 1/2 qhs 4)  Neurontin 600 Mg Tabs (Gabapentin) .... Qid (rx'd by neuro) 5)  Bayer Aspirin 325 Mg Tabs (Aspirin) .... Take 1 tablet by mouth once a day 6)  Hydrocodone-acetaminophen 5-500 Mg Tabs (Hydrocodone-acetaminophen) .... Qhs 7)  Mvi - Calcium  8)  B 12 Shots  Monthly  9)  Test Strips  .... Checks bs 1x once daily dx 250.00 10)  Pravachol 20 Mg Tabs (Pravastatin sodium) .Marland Kitchen.. 1 by mouth qhs 11)  Ciprofloxacin Hcl 250 Mg Tabs (Ciprofloxacin hcl) .... One by mouth twice a day for 3 days  Patient Instructions: 1)  rest 2)  Antibiotics for 3 days 3)  Take lots of fluids: Gatorade, 7-Up, soup 4)  Tried to eat solids if you can, crackers, rice. 5)  Come back if you have fever, increased abdominal pain, bit weaker, or if you are not better in a few days. 6)  Also call immediately if you see any blood in the stools or if you have more nausea and vomiting Prescriptions: CIPROFLOXACIN HCL 250 MG TABS (CIPROFLOXACIN HCL) one by mouth twice a day for 3 days  #6 x 0   Entered and Authorized by:   Nolon Rod. Paz MD   Signed by:   Nolon Rod. Paz MD on 03/08/2010   Method used:   Electronically to        [facility]       [address]       [place], [place]  [address]       Ph: [phone]       Fax: [phone]   RxID:   [id number]    Laboratory Results   Urine Tests    Routine Urinalysis   Color: orange Appearance: Hazy Glucose: negative   (Normal Range: Negative) Bilirubin: moderate   (Normal Range: Negative) Ketone: small (15)   (Normal Range: Negative) Spec. Gravity: 1.025   (Normal Range: 1.003-1.035) Blood: negative   (Normal Range: Negative) pH: 6.5   (Normal Range: 5.0-8.0) Protein: negative   (Normal Range: Negative) Urobilinogen: 0.2   (Normal Range: 0-1) Nitrite: negative   (Normal Range: Negative) Leukocyte Esterace: negative   (Normal Range: Negative)    Comments: Army Fossa CMA  March 08, 2010 10:15 AM

## 2010-07-26 NOTE — Letter (Signed)
Summary: [facility]  [facility]   Imported By: Lanelle Bal 12/22/2009 09:46:32  _____________________________________________________________________  External Attachment:    Type:   Image     Comment:   External Document

## 2010-07-26 NOTE — Assessment & Plan Note (Signed)
Summary: B-12 SHOT///SPH  Nurse Visit  CC: B-12 and flu./kb   Allergies: 1)  ! Sulfa  Medication Administration  Injection # 1:    Medication: Vit B12 1000 mcg    Diagnosis: ANEMIA, B12 DEFICIENCY (ICD-281.1)    Route: IM    Site: L deltoid    Exp Date: 08/25/2011    Lot #: [id number]    Mfr: American Regent    Patient tolerated injection without complications    Given by: Lucious Groves CMA (April 13, 2010 10:12 AM)  Orders Added: 1)  Flu Vaccine 12yrs + MEDICARE PATIENTS [Q2039] 2)  Administration Flu vaccine - MCR [G0008] 3)  Vit B12 1000 mcg [J3420] 4)  Admin of Therapeutic Inj  intramuscular or subcutaneous [96372]           Flu Vaccine Consent Questions     Do you have a history of severe allergic reactions to this vaccine? no    Any prior history of allergic reactions to egg and/or gelatin? no    Do you have a sensitivity to the preservative Thimersol? no    Do you have a past history of Guillan-Barre Syndrome? no    Do you currently have an acute febrile illness? no    Have you ever had a severe reaction to latex? no    Vaccine information given and explained to patient? yes    Are you currently pregnant? no    Lot Number:[id number]   Exp Date:12/24/2010   Site Given  Left Deltoid IMu

## 2010-07-26 NOTE — Assessment & Plan Note (Signed)
Summary: b12 inj//lch  Nurse Visit   Allergies: 1)  ! Sulfa  Medication Administration  Injection # 1:    Medication: Vit B12 1000 mcg    Diagnosis: ANEMIA, B12 DEFICIENCY (ICD-281.1)    Route: IM    Site: L deltoid    Exp Date: 08/2011    Lot #: [id number]    Mfr: American Regent    Patient tolerated injection without complications    Given by: Army Fossa CMA (December 21, 2009 10:53 AM)  Orders Added: 1)  Admin of Therapeutic Inj  intramuscular or subcutaneous [96372] 2)  Vit B12 1000 mcg [J3420]

## 2010-07-26 NOTE — Assessment & Plan Note (Signed)
Summary: b12 shot/kdc  Nurse Visit   Allergies: 1)  ! Sulfa  Medication Administration  Injection # 1:    Medication: Vit B12 1000 mcg    Diagnosis: ANEMIA, B12 DEFICIENCY (ICD-281.1)    Route: IM    Site: L deltoid    Exp Date: 04/2011    Lot #: [id number]    Mfr: American Regent    Patient tolerated injection without complications    Given by: Floydene Flock (August 10, 2009 10:53 AM)  Orders Added: 1)  Admin of Therapeutic Inj  intramuscular or subcutaneous [96372] 2)  Vit B12 1000 mcg [J3420]   Medication Administration  Injection # 1:    Medication: Vit B12 1000 mcg    Diagnosis: ANEMIA, B12 DEFICIENCY (ICD-281.1)    Route: IM    Site: L deltoid    Exp Date: 04/2011    Lot #: [id number]    Mfr: American Regent    Patient tolerated injection without complications    Given by: Floydene Flock (August 10, 2009 10:53 AM)  Orders Added: 1)  Admin of Therapeutic Inj  intramuscular or subcutaneous [96372] 2)  Vit B12 1000 mcg [J3420]

## 2010-07-26 NOTE — Assessment & Plan Note (Signed)
Summary: b12 inj//lch  Nurse Visit   Allergies: 1)  ! Sulfa  Medication Administration  Injection # 1:    Medication: Vit B12 1000 mcg    Diagnosis: ANEMIA, B12 DEFICIENCY (ICD-281.1)    Route: IM    Site: L deltoid    Exp Date: 04/2011    Lot #: [id number]    Patient tolerated injection without complications    Given by: Shary Decamp (Nov 15, 2009 2:02 PM)  Orders Added: 1)  Vit B12 1000 mcg [J3420] 2)  Admin of Therapeutic Inj  intramuscular or subcutaneous [[address]]

## 2010-07-28 NOTE — Letter (Signed)
Summary: weakness--holding statins; Neuroscience  [facility]   Imported By: Lanelle Bal 06/07/2010 12:14:05  _____________________________________________________________________  External Attachment:    Type:   Image     Comment:   External Document

## 2010-08-03 ENCOUNTER — Encounter (INDEPENDENT_AMBULATORY_CARE_PROVIDER_SITE_OTHER): Payer: Self-pay | Admitting: *Deleted

## 2010-08-03 NOTE — Assessment & Plan Note (Signed)
Summary: CPX/FASTING//KN   Vital Signs:  Patient profile:   77 year old female Height:      66.75 inches Weight:      156.38 pounds BMI:     24.77 Pulse rate:   84 / minute Pulse rhythm:   regular BP sitting:   126 / 82  (left arm) Cuff size:   regular  Vitals Entered By: Army Fossa CMA (July 26, 2010 9:35 AM) CC: CPX, fasting  Comments no complaints  [employer] not had mammo or pap in several years    History of Present Illness: Here for Medicare AWV:  1.   Risk factors based on Past M, S, F history: reviewed  2.   Physical Activities: does some walking, house chores  3.   Depression/mood: worries a lot about "everything", thinks she has some anxiety, no                                 depression. Thinks that needs ambien b/c anxiety, not b/c insomnia perse. symptoms are                  chronic 4.   Hearing: mild problems, none noted today at the visit .  declined to see a hearing specialist 5.   ADL's: independent  6.   Fall Risk: no recent falls, admits to poor balance, fall prevention discussed  7.   Home Safety:  does feel safe at home  8.   Height, weight, &visual acuity: see Vs, vision corrected w/ glasses  9.   Counseling: yes  10.   Labs ordered based on risk factors: yes  11.           Referral Coordination, if needed  12.           Care Plan, see a/p 13.            Cognitive Assessment:  motor skills,cognition and mood seem appropriate  in addition we discussed the following issues  AODM -- good compliance  w/ diet , ambulatory CBGs in AM 150 to 110; before dinner 90 to 130 B 12 def  ANEMIA-- due for a shot today Progressive Neuropathy--saw neurology 12-11, note reviewed, had distal UE weakness;they decided to hold  statins which she is doing x 10 days, some improvment since   Hyperlipidemia, started meds 6-09--- see above   had a normal DEXA   7-10   Preventive Screening-Counseling & Management  Alcohol-Tobacco     Alcohol type: wine    Caffeine-Diet-Exercise     Does Patient Exercise: no  Current Medications (verified): 1)  Provigil 200 Mg  Tabs (Modafinil) .Marland Kitchen.. 1 By Mouth Qam 2)  Cymbalta 60 Mg Cpep (Duloxetine Hcl) .... Take 2 Capsule By Mouth Once A Day 3)  Ambien 10 Mg Tabs (Zolpidem Tartrate) .... 1/2 Qhs 4)  Neurontin 600 Mg Tabs (Gabapentin) .... Qid (Rx'd By Neuro) 5)  Bayer Aspirin 325 Mg Tabs (Aspirin) .... Take 1 Tablet By Mouth Once A Day 6)  Hydrocodone-Acetaminophen 5-500 Mg  Tabs (Hydrocodone-Acetaminophen) .... Qhs 7)  Mvi - Calcium 8)  B 12 Shots Monthly 9)  Test Strips .... Checks Bs 1x Once Daily Dx 250.00  Allergies (verified): 1)  ! Sulfa  Past History:  Past Medical History: DISTURBANCE, SLEEP ----> Narcolepsy AODM  B 12 def  ANEMIA NEUROPATHY Dx 1998, not related to DM, w/u neg (idiopatic Progressive Neuropathy) ---f/u [facility]  2005: (-) CAROTID U/S AND CARDIOLITE Osteoarthritis Hyperlipidemia, started meds 6-09 DEXA normal 7-10   Past Surgical History: Reviewed history from 12/22/2008 and no changes required. Hysterectomy, no oophorectomy  L cataract surgery 9-09 R cataract surgery 3/10  Family History: Reviewed history from 08/20/2007 and no changes required. CAD - sis, M (tachycardia), bro HTN - Bro increased chol - bro alzheimer-M DM - no colon Ca - no breast Ca - no  Social History: Reviewed history from 10/12/2009 and no changes required. Married x 56 years children x 2 Former Smoker Alcohol use-yes wine 3-4x/wk Drug use-no Regular exercise-- increasingly diff. to exercise d/t lack of balance (neuropathy) caffeine - 4 cups once daily still drives  eats healthyDoes Patient Exercise:  no  Review of Systems CV:  Denies chest pain or discomfort and swelling of feet. Resp:  Denies cough and shortness of breath. GI:  Denies bloody stools, diarrhea, nausea, and vomiting. GU:  Denies abnormal vaginal bleeding and discharge.  Physical  Exam  General:  alert, well-developed, and well-nourished.   Neck:  full ROM and no thyromegaly.  normal carotid upstroke.   Lungs:  normal respiratory effort, no intercostal retractions, no accessory muscle use, and normal breath sounds.   Heart:  normal rate, regular rhythm, no murmur, and no gallop.   Abdomen:  soft, non-tender, and no distention.   Extremities:  no pretibial edema bilaterally    Psych:  Oriented X3, memory intact for recent and remote, normally interactive, good eye contact, not anxious appearing, and not depressed appearing.     Impression & Recommendations:  Problem # 1:  HEALTH SCREENING (ICD-V70.0)  Td 2007 Pneumonia shot 2007 shingles shot : had one already had a flu shot  sees gyn for female care, they do breast exam ,   MMGs has not been seen in 2 years, encouraged to go   DEXA12-07 (-), 7-10 normal  cont w/ Calcium, vit d  pt  got a letter from Dr. Russella Dar ----> next 2012   has a healthy life style   Orders: Medicare -1st Annual Wellness Visit [phone])  Problem # 2:  HYPERLIPIDEMIA (ICD-272.4) holding Pravachol for 10 days to see if that affects her neuropathy  The following medications were removed from the medication list:    Pravachol 20 Mg Tabs (Pravastatin sodium) .Marland Kitchen... 1 by mouth qhs  Labs Reviewed: SGOT: 23 (10/12/2009)   SGPT: 18 (10/12/2009)   HDL:46.70 (10/12/2009), 36.10 (12/22/2008)  LDL:59 (12/22/2008), 69 (04/22/2008)  Chol:159 (10/12/2009), 134 (12/22/2008)  Trig:256.0 (10/12/2009), 193.0 (12/22/2008)  Orders: Venipuncture (69629) TLB-ALT (SGPT) (84460-ALT) TLB-AST (SGOT) (84450-SGOT) Specimen Handling (52841)  Problem # 3:  ANEMIA, B12 DEFICIENCY (ICD-281.1)  good compliance with shots  Orders: Admin of Therapeutic Inj  intramuscular or subcutaneous (32440) Vit B12 1000 mcg (J3420)  Problem # 4:  NEUROPATHY (ICD-355.9) see history of present illness, followup by neurology. Currently holding Pravachol. On  Cymbalta  Problem # 5:  AODM (ICD-250.00)  on diet only. Labs Her updated medication list for this problem includes:    Bayer Aspirin 325 Mg Tabs (Aspirin) .Marland Kitchen... Take 1 tablet by mouth once a day  Labs Reviewed: Creat: 0.6 (02/16/2010)    Reviewed HgBA1c results: 6.9 (02/16/2010)  6.6 (10/12/2009)  Orders: TLB-A1C / Hgb A1C (Glycohemoglobin) (83036-A1C) Specimen Handling (10272)  Problem # 6:  SYMPTOM, DISTURBANCE, SLEEP NOS (ICD-780.50) she has insomnia and requires Ambien as needed. She also has narcolepsy, takes nuvigil  rarely. see history of present illness, wonders if anxiety is playing  a role in her difficulty sleeping. She is already taking Cymbalta actually for neuropathy. Will reassess and return to the office, if Cymbalta is not  helping a lot with neuropathy, consider to switch to an SSRI  Complete Medication List: 1)  Provigil 200 Mg Tabs (Modafinil) .Marland Kitchen.. 1 by mouth in am sometimes 2)  Cymbalta 60 Mg Cpep (Duloxetine hcl) .... Take 2 capsule by mouth once a day 3)  Ambien 10 Mg Tabs (Zolpidem tartrate) .... 1/2 qhs 4)  Neurontin 600 Mg Tabs (Gabapentin) .... Qid (rx'd by neuro) 5)  Bayer Aspirin 325 Mg Tabs (Aspirin) .... Take 1 tablet by mouth once a day 6)  Hydrocodone-acetaminophen 5-500 Mg Tabs (Hydrocodone-acetaminophen) .... Qhs 7)  Mvi - Calcium  8)  B 12 Shots Monthly  9)  Test Strips  .... Checks bs 1x once daily dx 250.00  Other Orders: T-Vitamin D (25-Hydroxy) (04540-98119)  Patient Instructions: 1)  Please schedule a follow-up appointment in 6 months .    Medication Administration  Injection # 1:    Medication: Vit B12 1000 mcg    Diagnosis: ANEMIA, B12 DEFICIENCY (ICD-281.1)    Route: IM    Site: R deltoid    Exp Date: 08/2011    Lot #: [id number]    Mfr: American Regent    Patient tolerated injection without complications    Given by: Army Fossa CMA (July 26, 2010 10:25 AM)  Orders Added: 1)  Venipuncture [14782] 2)  TLB-A1C / Hgb  A1C (Glycohemoglobin) [83036-A1C] 3)  T-Vitamin D (25-Hydroxy) [95621-30865] 4)  TLB-ALT (SGPT) [84460-ALT] 5)  TLB-AST (SGOT) [84450-SGOT] 6)  Admin of Therapeutic Inj  intramuscular or subcutaneous [96372] 7)  Vit B12 1000 mcg [J3420] 8)  Specimen Handling [99000] 9)  Medicare -1st Annual Wellness Visit [G0438] 10)  Est. Patient Level III [78469]     Risk Factors:     Type:  wine  Exercise:  no

## 2010-08-11 NOTE — Letter (Signed)
Summary: Unable To Reach-Consult Scheduled  [facility] [place]  [address] [place], [place] [address]   Phone: [phone]  Fax: [phone]    08/03/2010 MRN: [id number]    Dear Ms. Jillson,   We have been unable to reach you by phone.  Please contact our office with an updated phone number.  If you have any question please call us.     Thank you,  Army Fossa CMA  August 03, 2010 2:17 PM

## 2010-10-25 ENCOUNTER — Ambulatory Visit (INDEPENDENT_AMBULATORY_CARE_PROVIDER_SITE_OTHER): Payer: Medicare Other | Admitting: Internal Medicine

## 2010-10-25 ENCOUNTER — Telehealth: Payer: Self-pay | Admitting: Internal Medicine

## 2010-10-25 ENCOUNTER — Encounter: Payer: Self-pay | Admitting: Internal Medicine

## 2010-10-25 DIAGNOSIS — R1031 Right lower quadrant pain: Secondary | ICD-10-CM | POA: Insufficient documentation

## 2010-10-25 DIAGNOSIS — R109 Unspecified abdominal pain: Secondary | ICD-10-CM

## 2010-10-25 LAB — POCT URINALYSIS DIPSTICK
Bilirubin, UA: NEGATIVE
Ketones, UA: NEGATIVE
Leukocytes, UA: NEGATIVE
pH, UA: 6

## 2010-10-25 LAB — CBC WITH DIFFERENTIAL/PLATELET
Basophils Absolute: 0 10*3/uL (ref 0.0–0.1)
Basophils Relative: 0 % (ref 0–1)
MCHC: 33 g/dL (ref 30.0–36.0)
Neutro Abs: 3.7 10*3/uL (ref 1.7–7.7)
Neutrophils Relative %: 59 % (ref 43–77)
Platelets: 216 10*3/uL (ref 150–400)
RDW: 14.3 % (ref 11.5–15.5)

## 2010-10-25 LAB — BASIC METABOLIC PANEL
Calcium: 10.3 mg/dL (ref 8.4–10.5)
Potassium: 4.4 mEq/L (ref 3.5–5.3)
Sodium: 138 mEq/L (ref 135–145)

## 2010-10-25 NOTE — Progress Notes (Signed)
  Subjective:    Patient ID: Sierra Klein, female    DOB: 1932/05/09, 77 y.o.   MRN: [id number]  HPI  One-month history of a dull, on and off ache at the right lower quadrant of the abdomen. 3 or 4 days ago, she noted that the area hurts worse when bending her leg over her abdomen or when she is lifting. No radiation or any bulging anywhere in that area.  Past Medical History  Diagnosis Date  . Disturbance, sleep     narcolepsy  . Diabetes mellitus   . B12 deficiency anemia   . 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 Osteoarthritis   . Hyperlipidemia     started med 6/09   Past Surgical History  Procedure Date  . Abdominal hysterectomy     no oophorectomy  . Cataract extraction     l- 9/09, r- 3/10     Review of Systems No fever or chills She still has a great appetite. No nausea, vomiting, diarrhea. No blood in the stools. No dysuria or gross hematuria. No rash in the abdomen or legs. No vaginal discharge, rash or bleeding. Chart is reviewed, she had a hysterectomy without  Oophorectomy at age 42. She had a colonoscopy before, due to a repeat colonoscopy this year.    Objective:   Physical Exam  Constitutional: She appears well-developed and well-nourished.       Afebrile  HENT:  Head: Normocephalic and atraumatic.  Eyes:       Not pale or jaundice  Cardiovascular: Normal rate, regular rhythm and normal heart sounds.   No murmur heard. Pulmonary/Chest: Effort normal and breath sounds normal. No respiratory distress. She has no wheezes. She has no rales.  Abdominal:       Abdomen is nondistended, good bowel sounds, mild tenderness at the most distal aspect of the right lower quadrant without mass or rebound. McBurney free of tenderness to deep palpation. Groins are examined, no mass, lymphadenopathy or hernia. Flexing the leg in the abdomen indeed causes pain.  Musculoskeletal: She exhibits no  edema.          Assessment & Plan:

## 2010-10-25 NOTE — Telephone Encounter (Signed)
Noted  

## 2010-10-25 NOTE — Telephone Encounter (Signed)
Patient's spouse called to see if Sierra Klein's appointment for today at 3:45 could be moved up because he is worried that she has appendicitis (based on symptoms that he had years ago when he had appendicitis)--"lower right side, acute pain, started Fri night-Sat Morning"---advised him I would talk to triage nurse who was on phone and would call him back--Chemira said to tell him--if he suspected appendicitis, to go to ER; that coming here would only delay things---I called back and spoke to Sierra Klein and told him that I had given symptoms to Triage Nurse and she advised to go to ER---he said things werent  that bad yet, so they would keep 3:45 appointment today

## 2010-10-25 NOTE — Assessment & Plan Note (Addendum)
One month history of a dull ache in the right lower quadrant, worse in the last 3-4 days. She is afebrile, appetite is normal, on exam there is mild tenderness. She had a colonoscopy in the past and she is due for another one. She had a partial hysterectomy before, has not seen her gynecologist in a few years. Urinalysis is negative. Plan: BMP, CBC. CT of the abdomen to rule out intra-abdominal etiologies. I prefer to get the results of the BMP and CBC before we do the CT (to check her renal FX). Although appendicitis is in the DDX  it is unlikely, nevertheless I asked her to go to the ER and she gets fever or severe pain over back pain tonight.Ic CT neg, will need further eval.

## 2010-10-25 NOTE — Patient Instructions (Signed)
Please go to the ER if symptoms severe, high fever, back pain, nausea or vomiting.

## 2010-10-26 ENCOUNTER — Ambulatory Visit (INDEPENDENT_AMBULATORY_CARE_PROVIDER_SITE_OTHER)
Admission: RE | Admit: 2010-10-26 | Discharge: 2010-10-26 | Disposition: A | Discharge status: Home / Self Care | Payer: Medicare Other | Source: Ambulatory Visit | Attending: Internal Medicine | Admitting: Internal Medicine

## 2010-10-26 ENCOUNTER — Telehealth: Payer: Self-pay | Admitting: Internal Medicine

## 2010-10-26 DIAGNOSIS — R1031 Right lower quadrant pain: Secondary | ICD-10-CM

## 2010-10-26 MED ORDER — IOHEXOL 300 MG/ML  SOLN
100.0000 mL | Freq: Once | INTRAMUSCULAR | Status: AC | PRN
  Administered 2010-10-26: 100 mL via INTRAVENOUS

## 2010-10-26 NOTE — Telephone Encounter (Signed)
I spoke w/ pt she is aware.  

## 2010-10-26 NOTE — Telephone Encounter (Signed)
Advise patient, CT essentially normal except for some constipation. Symptoms are probably musculoskeletal. Recommend to take Tylenol for pain a few days. If the pain is not improving or if it gets worse she needs to let me know. She is due for a colonoscopy and I recommend her to proceed with that at her convenience.

## 2010-10-28 LAB — CULTURE, URINE COMPREHENSIVE: Colony Count: 30000

## 2010-10-29 ENCOUNTER — Telehealth: Payer: Self-pay | Admitting: Internal Medicine

## 2010-10-29 MED ORDER — CIPROFLOXACIN HCL 500 MG PO TABS: 500.0000 mg | ORAL_TABLET | Freq: Two times a day (BID) | ORAL | Status: AC

## 2010-10-29 NOTE — Telephone Encounter (Signed)
Urine culture become positive. I called the patient, she still has some discomfort but not as severe, actually a UTI may explain all her symptoms. Will call in an antibiotic to her pharmacy. She is to call us if she is not completely well within 2 weeks.

## 2010-11-11 NOTE — Discharge Summary (Signed)
[facility]  Patient:    Sierra Klein, Sierra Klein Visit Number: [id number] MRN: [id number]          Service Type: GYN Location: 4W 0464 01 Attending Physician:  Lendon Colonel Dictated by:   Kathie Rhodes. Kyra Manges, M.D. Admit Date:  08/15/2001 Discharge Date: 08/17/2001                             Discharge Summary  ADMISSION DIAGNOSIS:  Pelvic relaxation with prolapse of bladder, rectum and rectocele.  DISCHARGE DIAGNOSIS:  Pelvic relaxation with prolapse of bladder, rectum and rectocele.  PROCEDURE:  Anterior and posterior enterocele repair and perineoplasty (major pelvic reconstruction).  HISTORY OF PRESENT ILLNESS:  Ms. Speckman is a 77 year old female with genital prolapse who desired this to be corrected complaining of pelvic pressure and the feeling that things are falling out.  She also had difficulty with evacuation.  LABORATORY DATA AND X-RAY FINDINGS:  Admission hemoglobin 14.  Glucose, BUN and creatinine were normal.  Electrocardiogram was normal.  The patient had a limited CT which turned out to be normal.  HOSPITAL COURSE:  On the day of surgery, she underwent an uneventful anterior colporrhaphy, posterior colporrhaphy, perineoplasty and enterocele repair. Her postoperative course was uncomplicated.  She remained afebrile and without complaints.  She voided spontaneously and on the second day of admission, was discharged to home in office care.  DISCHARGE MEDICATION:  Percocet for pain.  SPECIAL INSTRUCTIONS:  She was asked to call for fever, bleeding or any other difficulties she may encounter.  CONDITION ON DISCHARGE:  Improved. Dictated by:   S. Kyra Manges, M.D. Attending Physician:  Lendon Colonel DD:  08/22/01 TD:  08/23/01 Job: [id number]

## 2010-11-11 NOTE — Op Note (Signed)
NAMEYENNI, CARRA NO.:  [id number]   MEDICAL RECORD NO.:  [id number]          PATIENT TYPE:  INP   LOCATION:  2899                         FACILITY:  [facility]   PHYSICIAN:  Tia Alert, MD     DATE OF BIRTH:  1931/12/13   DATE OF PROCEDURE:  12/08/2005  DATE OF DISCHARGE:                                 OPERATIVE REPORT   PREOPERATIVE DIAGNOSIS:  Left frontal skull lesion.   POSTOPERATIVE DIAGNOSIS:  Left frontal skull lesion.   PROCEDURE:  Excision of left frontal skull lesion.   SURGEON:  Tia Alert, M.D.   ANESTHESIA:  General tracheal.   COMPLICATIONS:  None apparent.   INDICATIONS FOR PROCEDURE:  Sierra Klein is a 77 year old white female seen  neurosurgical consultation regarding a left frontal skull lesion which was  subcutaneous and seemed be attached to the left frontal bone but was outside  the cortex of the bone.  It seemed to be growing and was causing some  cosmetic concerns for her but appeared to be a benign lesion.  I recommended  excision of the lesion.  She understood the risks, benefits, and expected  outcome and wished to proceed.   DESCRIPTION OF PROCEDURE:  The patient was taken to the operating room and  after induction of adequate generalized endotracheal anesthesia, she was  placed in a supine position on the operating table.  Her frontal region was  prepped with DuraPrep and draped in the usual sterile fashion.  1 mL of  local anesthesia was injected and a small incision was made right over the  lesion. The subcuticular bleeding was coagulated with bipolar cautery and  then I was able to dissect around the lesion which had a smooth waxy border.  I was then able to grab it with a small Leksell rongeur and twist it off the  frontal bone.  It did not appear to involve the cortical mantle. I was able  to curette this bone and then used Bovie cautery to kill whatever cells may  be attached to the bony surface.  I then irrigated  with saline stitch  solution containing bacitracin and closed the subcuticular tissue with 3-0  Vicryl and the skin with Benzoin and Steri-Strips.  A sterile dressing was  applied.  The patient was awakened from general anesthesia and transferred  to the recovery room in stable condition.  At the end of the procedure, all  sponge, needle and instrument counts were correct.      Tia Alert, MD  Electronically Signed     DSJ/MEDQ  D:  12/08/2005  T:  12/08/2005  Job:  [id number]

## 2010-11-11 NOTE — Op Note (Signed)
[facility]  Patient:    Sierra Klein, Sierra Klein Visit Number: [id number] MRN: [id number]          Service Type: Attending:  Katherine Roan, M.D. Dictated by:   S. Kyra Manges, M.D. Proc. Date: 08/15/01                             Operative Report  PREOPERATIVE DIAGNOSIS:  Genital prolapse with cystocele, rectocele, and enterocele.  POSTOPERATIVE DIAGNOSIS:  Genital prolapse with cystocele, rectocele, and enterocele.  OPERATION: 1. Anterior colporrhaphy. 2. Posterior colporrhaphy. 3. Repair of enterocele. 4. Perineoplasty.  SURGEON:  Katherine Roan, M.D.  DESCRIPTION OF PROCEDURE:  The patient was placed in lithotomy position and the prepped and draped in the usual fashion. Apex of the vagina was identified, enterocele sac was dissected, and the peritoneal cavity was entered. The uterosacral ligaments were then plicated carefully in the midline with interrupted sutures of 2-0 Vicryl and 3-0 Ethibond. The peritoneal cavity was enclosed with 2-0 PDS. The uterosacral ligaments were then reinforced posteriorly for good vault support. We then went above and dissected the pubocervicovaginal fascia, plicated this in the midline with interrupted sutures of 3-0 Vicryl. The vagina was then excised and then closed with interrupted sutures of 3-0 Vicryl. A 4-0 Vicryl was also used. Posteriorly, we wedged out the perineal body, dissected the rectum from the underlying rectal mucosa, and plicated this in the midline with interrupted sutures of 3-0 Vicryl and 4-0 Vicryl. This effected an excellent reduction of the cystocele repair and wedge of the vagina was then incised and enclosed with a locking suture of 3-0 chromic. The perineal body was then approximated by approximating the bulbocavernosus, also in the transverse perineum, and also with interrupted sutures of 3-0 Vicryl and 4-0 Vicryl. The skin was closed with a subcuticular 3-0 chromic. There was a bleeder in  the upper part of the vagina which was sutured with two sutures of 0 chromic. The vagina was then dried and irrigated. The Foley drained clear urine. Ms. [name] tolerated this procedure well and was sent to recovery in good condition. Dictated by:   S. Kyra Manges, M.D. Attending:  Katherine Roan, M.D. DD:  08/15/01 TD:  08/15/01 Job: [id number]

## 2010-11-11 NOTE — H&P (Signed)
[facility]  Patient:    Sierra Klein, Sierra Klein Visit Number: [id number] MRN: [id number]          Service Type: Attending:  Katherine Roan, M.D. Dictated by:   S. Kyra Manges, M.D. Adm. Date:  08/15/01                           History and Physical  CHIEF COMPLAINT:  Difficulty with evacuation requiring intervaginal manipulation having discomfort and prolapse.  She admits that her bladder and vagina falls on the outside and she has an occasional incontinence.  She had a recent colonoscopy which revealed diverticulitis.  She is status post vaginal hysterectomy done in 1982.  MEDICATIONS: 1. Premarin 0.625 mg 2. Neurontin. 3. Darvocet.  ALLERGIES:  SULFA.  REVIEW OF SYSTEMS:  Heent:  She wears glasses but has noted no dizziness, no decrease in vision or auditory acuity.  No headaches.  Heart:  She takes no medication, no chest pain, no shortness of breath.  No history of heart murmur.  Lungs:  No chronic cough although she had a chest x-ray which required a CT to evaluate some parenchymal scarring.  This turned out to be negative.  GU:  She has occasional incontinence but mostly pelvic pressure and some frequency.  GI:  She requires intervaginal manipulation of a rectocele to help evacuate her rectum.  She notes no rectal incontinence.  No bowel habit change, no melena, no other problems associated with her GI system.  A recent colonoscopy revealed diverticulosis.  Muscles, bones, and joints, no fractures or arthritis.  SOCIAL HISTORY:  She drinks alcohol socially and does not smoke.  She is retired.  FAMILY HISTORY:  Her mother died at 32 of a heart attack.  Her father died at 39 of an automobile accident.  She has 2 brothers, one has had a bypass procedure.  One sister is living and has a cardiac arrhythmia.  She has a cousin with diabetes.  PHYSICAL EXAMINATION:  GENERAL:  Reveals a well-developed well-nourished female who appears her stated age  of 89.  She is oriented to time, place and recent events.  VITAL SIGNS:  Weight is 157.  Blood pressure 130/80, pulse 80, respirations 16, temperature 99.0.  HEENT:  Unremarkable.  Oropharynx is not injected.  NECK:  Supple.  Carotid pulses are equal without bruits.  Trachea is in the midline and the thyroid is not enlarged.  No adenopathy felt.  BREAST:  No masses or tenderness.  Axillae free from adenopathy.  LUNGS:  Clear.  Diaphragm moves well with inspiration and expiration.  HEART:  Normal sinus rhythm, no murmurs.  No heaves, thrills, rubs or gallops.  ABDOMEN:  Soft, no masses are felt.  Liver, spleen, and kidneys are not enlarged.  No tenderness.  Bowel sounds are normal and no bruits are heard.  PELVIC:  Reveals a second to third degree cystocele with straining the bladder comes out the introitus.  There is also a prolapse of the vaginal apex or a high rectocele.  There is a large posterior defect as well.  Hemoccult is negative.  EXTREMITIES:  No edema.  Good pulses and reflexes.  IMPRESSION:  Symptomatic pelvic relaxation with prolapse of bladder and rectum and rectocele with possible small enterocele.  PLAN:  A&P repair, possible birch. Dictated by:   S. Kyra Manges, M.D. Attending:  Katherine Roan, M.D. DD:  08/15/01 TD:  08/15/01 Job: [id number]

## 2010-11-14 ENCOUNTER — Other Ambulatory Visit: Payer: Self-pay | Admitting: Internal Medicine

## 2010-11-14 ENCOUNTER — Other Ambulatory Visit (INDEPENDENT_AMBULATORY_CARE_PROVIDER_SITE_OTHER): Payer: Medicare Other

## 2010-11-14 DIAGNOSIS — N390 Urinary tract infection, site not specified: Secondary | ICD-10-CM

## 2010-11-14 LAB — POCT URINALYSIS DIPSTICK
Bilirubin, UA: NEGATIVE
Blood, UA: NEGATIVE
Glucose, UA: NEGATIVE
Spec Grav, UA: 1.015

## 2010-11-15 LAB — URINALYSIS
Bacteria, UA: NONE SEEN
Casts: NONE SEEN
Crystals: NONE SEEN
Hgb urine dipstick: NEGATIVE
Leukocytes, UA: NEGATIVE
Nitrite: NEGATIVE
Specific Gravity, Urine: 1.011 (ref 1.005–1.030)
Urobilinogen, UA: 0.2 mg/dL (ref 0.0–1.0)
pH: 5.5 (ref 5.0–8.0)

## 2010-11-16 LAB — URINE CULTURE
Colony Count: NO GROWTH
Organism ID, Bacteria: NO GROWTH

## 2010-11-17 ENCOUNTER — Telehealth: Payer: Self-pay | Admitting: *Deleted

## 2010-11-17 NOTE — Telephone Encounter (Signed)
Pt is aware.  

## 2010-11-17 NOTE — Telephone Encounter (Signed)
Rec observation for now, drink plenty of fluids and tylenol. OV if pain is severe or If she is no better in 10 days

## 2010-11-17 NOTE — Telephone Encounter (Signed)
I spoke w/ pt she is aware of labs, she states she is still having pain in her side. Please advise if there is something else she needs to do.

## 2010-11-17 NOTE — Telephone Encounter (Signed)
Message copied by Army Fossa on Thu Nov 17, 2010  9:15 AM ------      Message from: Willow Ora      Created: Wed Nov 16, 2010  5:09 PM       Advise patient:             urine culture is now  negative, as long as she is not having any symptoms, no need for further treatment

## 2011-01-24 ENCOUNTER — Ambulatory Visit: Payer: Self-pay | Admitting: Internal Medicine

## 2011-01-31 ENCOUNTER — Ambulatory Visit (INDEPENDENT_AMBULATORY_CARE_PROVIDER_SITE_OTHER): Payer: Medicare Other | Admitting: Internal Medicine

## 2011-01-31 ENCOUNTER — Encounter: Payer: Self-pay | Admitting: Internal Medicine

## 2011-01-31 DIAGNOSIS — D518 Other vitamin B12 deficiency anemias: Secondary | ICD-10-CM

## 2011-01-31 DIAGNOSIS — R197 Diarrhea, unspecified: Secondary | ICD-10-CM

## 2011-01-31 DIAGNOSIS — E119 Type 2 diabetes mellitus without complications: Secondary | ICD-10-CM

## 2011-01-31 DIAGNOSIS — R1031 Right lower quadrant pain: Secondary | ICD-10-CM

## 2011-01-31 LAB — HEMOGLOBIN A1C: Hgb A1c MFr Bld: 6.2 % (ref 4.6–6.5)

## 2011-01-31 MED ORDER — CYANOCOBALAMIN 1000 MCG/ML IJ SOLN
1000.0000 ug | Freq: Once | INTRAMUSCULAR | Status: AC
  Administered 2011-01-31: 1000 ug via INTRAMUSCULAR

## 2011-01-31 NOTE — Assessment & Plan Note (Addendum)
Chronic diarrhea, new problem: on chart review, her last colonoscopy was in 2002, she had a CT of the abdomen on May 2012 , diverticulosis was noted. Symptoms don't seem to be related to dairy products, see history of present illness. Etiology not completely clear,  could be colitis, infection, etc. Plan: will do stool studies, we'll recommend Align for one month. I noticed she is due for a colonoscopy with Dr. Russella Dar this year, pt has not make her mind about having another cscope however, if she is not better after align, she will call for a GI referal

## 2011-01-31 NOTE — Progress Notes (Signed)
  Subjective:    Patient ID: Sierra Klein, female    DOB: [birth year], 77 y.o.   MRN: [id number]  HPI Routine visit Was seen a few months ago with abdominal pain, CT negative, urine culture was positive, status post antibiotics, repeated culture negative. Currently is asx. Today she reports 6 to 7  year history of on and off diarrhea, approximately 3 times a week she has very watery stools without blood. There are  no associated symptoms, she eats very little dairy products and when she does the diarrhea is not worse or triggered. Diabetes--ambulatory blood sugars in the morning around 130 to 140, in the afternoon around 90-111  Past Medical History  Diagnosis Date  . Disturbance, sleep     narcolepsy  . Diabetes mellitus   . B12 deficiency anemia   . 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 Osteoarthritis   . Hyperlipidemia     started med 6/09   Past Surgical History  Procedure Date  . Abdominal hysterectomy     no oophorectomy  . Cataract extraction     l- 9/09, r- 3/10      Review of Systems No dysuria or gross hematuria No further abdominal pain Denies nausea, vomiting. No problems associated with diarrhea.    Objective:   Physical Exam  Constitutional: She is oriented to person, place, and time. She appears well-developed and well-nourished.  Cardiovascular: Normal rate, regular rhythm and normal heart sounds.   No murmur heard. Pulmonary/Chest: Effort normal and breath sounds normal. No respiratory distress. She has no wheezes. She has no rales.  Abdominal: Soft. She exhibits no distension. There is no tenderness. There is no rebound and no guarding.       Slight increased bowel sounds   Neurological: She is alert and oriented to person, place, and time.  Psychiatric: She has a normal mood and affect. Her behavior is normal. Judgment and thought content normal.          Assessment & Plan:

## 2011-01-31 NOTE — Assessment & Plan Note (Signed)
Resolved

## 2011-01-31 NOTE — Patient Instructions (Addendum)
Please bring stool samples for the following: Stool culture, wbc's, Hemoccult, C diff ----dx diarrhea  Take Align OTC x 1 month (1 a day)

## 2011-01-31 NOTE — Assessment & Plan Note (Signed)
Shot today 

## 2011-01-31 NOTE — Assessment & Plan Note (Signed)
Due for hemoglobin A1c. 

## 2011-02-01 ENCOUNTER — Telehealth: Payer: Self-pay | Admitting: *Deleted

## 2011-02-01 NOTE — Telephone Encounter (Signed)
Message copied by Regis Bill on Wed Feb 01, 2011 10:11 AM ------      Message from: Willow Ora E      Created: Wed Feb 01, 2011  8:20 AM       Advise patient, her hemoglobin A1c has improved decreasing from 6.7 to 6.2. Very good results

## 2011-02-01 NOTE — Telephone Encounter (Signed)
Patient informed. 

## 2011-02-20 ENCOUNTER — Other Ambulatory Visit: Payer: Self-pay | Admitting: Internal Medicine

## 2011-02-21 ENCOUNTER — Encounter: Payer: Self-pay | Admitting: Gastroenterology

## 2011-02-21 ENCOUNTER — Telehealth: Payer: Self-pay | Admitting: *Deleted

## 2011-02-21 LAB — FECAL OCCULT BLOOD, IMMUNOCHEMICAL: Fecal Occult Blood: POSITIVE — AB

## 2011-02-21 LAB — CLOSTRIDIUM DIFFICILE EIA: CDIFTX: NEGATIVE

## 2011-02-21 NOTE — Telephone Encounter (Signed)
Message copied by Regis Bill on Tue Feb 21, 2011 10:50 AM ------      Message from: Willow Ora E      Created: Tue Feb 21, 2011  7:50 AM       One of the stool test is positive, I recommend the patient to call GI and get her colonoscopy scheduled (see last office visit)

## 2011-02-21 NOTE — Telephone Encounter (Signed)
Pt Informed. Results Mailed.

## 2011-03-07 ENCOUNTER — Ambulatory Visit: Payer: Medicare Other

## 2011-03-07 ENCOUNTER — Ambulatory Visit (INDEPENDENT_AMBULATORY_CARE_PROVIDER_SITE_OTHER): Payer: Medicare Other | Admitting: *Deleted

## 2011-03-07 DIAGNOSIS — D518 Other vitamin B12 deficiency anemias: Secondary | ICD-10-CM

## 2011-03-07 MED ORDER — CYANOCOBALAMIN 1000 MCG/ML IJ SOLN
1000.0000 ug | Freq: Once | INTRAMUSCULAR | Status: AC
  Administered 2011-03-07: 1000 ug via INTRAMUSCULAR

## 2011-03-29 ENCOUNTER — Ambulatory Visit (AMBULATORY_SURGERY_CENTER): Payer: Medicare Other | Admitting: *Deleted

## 2011-03-29 VITALS — Ht 65.0 in | Wt 153.1 lb

## 2011-03-29 DIAGNOSIS — Z1211 Encounter for screening for malignant neoplasm of colon: Secondary | ICD-10-CM

## 2011-03-29 MED ORDER — MOVIPREP 100 G PO SOLR: ORAL | Status: DC

## 2011-04-10 ENCOUNTER — Ambulatory Visit (INDEPENDENT_AMBULATORY_CARE_PROVIDER_SITE_OTHER): Payer: Medicare Other | Admitting: *Deleted

## 2011-04-10 DIAGNOSIS — E538 Deficiency of other specified B group vitamins: Secondary | ICD-10-CM

## 2011-04-10 DIAGNOSIS — Z23 Encounter for immunization: Secondary | ICD-10-CM

## 2011-04-10 MED ORDER — CYANOCOBALAMIN 1000 MCG/ML IJ SOLN
1000.0000 ug | Freq: Once | INTRAMUSCULAR | Status: AC
  Administered 2011-04-10: 1000 ug via INTRAMUSCULAR

## 2011-04-12 ENCOUNTER — Encounter: Payer: Self-pay | Admitting: Gastroenterology

## 2011-04-12 ENCOUNTER — Ambulatory Visit (AMBULATORY_SURGERY_CENTER): Payer: Medicare Other | Admitting: Gastroenterology

## 2011-04-12 VITALS — BP 121/47 | HR 71 | Temp 97.2°F | Resp 20 | Ht 65.0 in | Wt 163.0 lb

## 2011-04-12 DIAGNOSIS — Z1211 Encounter for screening for malignant neoplasm of colon: Secondary | ICD-10-CM

## 2011-04-12 DIAGNOSIS — R197 Diarrhea, unspecified: Secondary | ICD-10-CM

## 2011-04-12 DIAGNOSIS — K5289 Other specified noninfective gastroenteritis and colitis: Secondary | ICD-10-CM

## 2011-04-12 DIAGNOSIS — K5730 Diverticulosis of large intestine without perforation or abscess without bleeding: Secondary | ICD-10-CM

## 2011-04-12 MED ORDER — SODIUM CHLORIDE 0.9 % IV SOLN: 500.0000 mL | INTRAVENOUS | Status: DC

## 2011-04-12 MED ORDER — CIPROFLOXACIN HCL 500 MG PO TABS: 500.0000 mg | ORAL_TABLET | Freq: Two times a day (BID) | ORAL | Status: AC

## 2011-04-12 NOTE — Patient Instructions (Signed)
Please refer to your blue and neon green sheets for instructions regarding diet and activity for the rest of today.  You may resume your medications as you would normally take them.   Diverticulosis Diverticulosis is a common condition that develops when small pouches (diverticula) form in the wall of the colon. The risk of diverticulosis increases with age. It happens more often in people who eat a low-fiber diet. Most individuals with diverticulosis have no symptoms. Those individuals with symptoms usually experience belly (abdominal) pain, constipation, or loose stools (diarrhea). HOME CARE INSTRUCTIONS  Increase the amount of fiber in your diet as directed by your caregiver or dietician. This may reduce symptoms of diverticulosis.   Your caregiver may recommend taking a dietary fiber supplement.   Drink at least 6 to 8 glasses of water each day to prevent constipation.   Try not to strain when you have a bowel movement.   Your caregiver may recommend avoiding nuts and seeds to prevent complications, although this is still an uncertain benefit.   Only take over-the-counter or prescription medicines for pain, discomfort, or fever as directed by your caregiver.  FOODS HAVING HIGH FIBER CONTENT INCLUDE:  Fruits. Apple, peach, pear, tangerine, raisins, prunes.   Vegetables. Brussels sprouts, asparagus, broccoli, cabbage, carrot, cauliflower, romaine lettuce, spinach, summer squash, tomato, winter squash, zucchini.   Starchy Vegetables. Baked beans, kidney beans, lima beans, split peas, lentils, potatoes (with skin).   Grains. Whole wheat bread, brown rice, bran flake cereal, plain oatmeal, white rice, shredded wheat, bran muffins.  SEEK IMMEDIATE MEDICAL CARE IF:  You develop increasing pain or severe bloating.   You have an oral temperature above 101.3, not controlled by medicine.   You develop vomiting or bowel movements that are bloody or black.  Document Released: 03/09/2004  Document Re-Released: 11/30/2009 [facility] Patient Information 2011 [place], [place].

## 2011-04-13 ENCOUNTER — Telehealth: Payer: Self-pay | Admitting: *Deleted

## 2011-04-13 NOTE — Telephone Encounter (Signed)

## 2011-04-17 ENCOUNTER — Encounter: Payer: Self-pay | Admitting: Gastroenterology

## 2011-04-25 ENCOUNTER — Encounter: Payer: Self-pay | Admitting: Gastroenterology

## 2011-05-15 ENCOUNTER — Ambulatory Visit (INDEPENDENT_AMBULATORY_CARE_PROVIDER_SITE_OTHER): Payer: Medicare Other | Admitting: *Deleted

## 2011-05-15 DIAGNOSIS — E538 Deficiency of other specified B group vitamins: Secondary | ICD-10-CM

## 2011-05-15 MED ORDER — CYANOCOBALAMIN 1000 MCG/ML IJ SOLN
1000.0000 ug | Freq: Once | INTRAMUSCULAR | Status: AC
  Administered 2011-05-15: 1000 ug via INTRAMUSCULAR

## 2011-05-17 ENCOUNTER — Ambulatory Visit (INDEPENDENT_AMBULATORY_CARE_PROVIDER_SITE_OTHER): Payer: Medicare Other | Admitting: Gastroenterology

## 2011-05-17 ENCOUNTER — Encounter: Payer: Self-pay | Admitting: Gastroenterology

## 2011-05-17 VITALS — BP 120/64 | HR 72 | Ht 65.0 in | Wt 154.0 lb

## 2011-05-17 DIAGNOSIS — K8020 Calculus of gallbladder without cholecystitis without obstruction: Secondary | ICD-10-CM

## 2011-05-17 DIAGNOSIS — R142 Eructation: Secondary | ICD-10-CM

## 2011-05-17 DIAGNOSIS — R1031 Right lower quadrant pain: Secondary | ICD-10-CM

## 2011-05-17 DIAGNOSIS — K5730 Diverticulosis of large intestine without perforation or abscess without bleeding: Secondary | ICD-10-CM

## 2011-05-17 DIAGNOSIS — R141 Gas pain: Secondary | ICD-10-CM

## 2011-05-17 NOTE — Progress Notes (Signed)
History of Present Illness: This is a 77 year old female who has had ongoing mild right lower quadrant pain since this spring. She underwent a CT scan of the abdomen and pelvis with results listed below. She states her symptoms are worse when she is up and moving. Her symptoms do not change with meals or bowel movements. She underwent a routine screening colonoscopy in October which revealed moderate sigmoid and descending colon diverticulosis with focal diverticulitis. He was treated with a course of Cipro. Her diverticulitis was asymptomatic. She has a significant neuropathy followed by Dr. Anne Hahn in [facility]. She has chronic constipation with hard stools. Her bowels move every 1- 2 days. She has significant flatulence for years. Denies weight loss, diarrhea, change in stool caliber, melena, hematochezia, nausea, vomiting, dysphagia, reflux symptoms, chest pain.  IMPRESSION:  1. No evidence of appendicitis or other acute findings.  2. Cholelithiasis.  3. Moderate hiatal hernia.  4. Diverticulosis. No radiographic evidence of diverticulitis.  5. Moderate to large colonic stool burden noted; suggest clinical  correlation for possible constipation.  Current Medications, Allergies, Past Medical History, Past Surgical History, Family History and Social History were reviewed in [employer] record.  Physical Exam: General: Well developed , well nourished, no acute distress Head: Normocephalic and atraumatic Eyes:  sclerae anicteric, EOMI Ears: Normal auditory acuity Mouth: No deformity or lesions Lungs: Clear throughout to auscultation Heart: Regular rate and rhythm; no murmurs, rubs or bruits Abdomen: Soft, minimal lower abdominal tenderness without rebound or guarding and non distended. No masses, hepatosplenomegaly or hernias noted. Normal Bowel sounds Rectal: unremarkable at colonoscopy in October Musculoskeletal: Symmetrical with no gross deformities  Pulses:  Normal  pulses noted Extremities: No clubbing, cyanosis, edema or deformities noted Neurological: Alert oriented x 4, grossly nonfocal Psychological:  Alert and cooperative. Normal mood and affect  Assessment and Recommendations:  1. RLQ pain. This appears to be musculoskeletal or neuropathic. No relation to any digestive function with a negative CT scan and colonoscopy. I have asked her to try Advil to 200-400 mg 3 times a day and to discuss further with Dr. Anne Hahn. No further GI workup is planned at this time.  2. Chronic constipation. Increase dietary fiber and water intake. May use a fiber supplement and may use Colace on a daily basis.  3. Cholelithiasis. Asymptomatic.  4. Gas and flatulence. Low gas diet Gas-X when necessary trial of several different probiotics.  5. Diverticulosis with recent mild diverticulitis.  6. Neuropathy.

## 2011-05-17 NOTE — Patient Instructions (Addendum)
Constipation information and low gas diet given. Take Colace stool softener once daily for constipation.  Increase your fluids every day.  Daily probiotic cc: Willow Ora, MD

## 2011-05-30 ENCOUNTER — Ambulatory Visit (INDEPENDENT_AMBULATORY_CARE_PROVIDER_SITE_OTHER): Payer: Medicare Other | Admitting: Internal Medicine

## 2011-05-30 ENCOUNTER — Encounter: Payer: Self-pay | Admitting: Internal Medicine

## 2011-05-30 ENCOUNTER — Ambulatory Visit (INDEPENDENT_AMBULATORY_CARE_PROVIDER_SITE_OTHER)
Admission: RE | Admit: 2011-05-30 | Discharge: 2011-05-30 | Disposition: A | Discharge status: Home / Self Care | Payer: Medicare Other | Source: Ambulatory Visit | Attending: Internal Medicine | Admitting: Internal Medicine

## 2011-05-30 VITALS — BP 120/70 | HR 67 | Temp 98.1°F | Wt 155.0 lb

## 2011-05-30 DIAGNOSIS — S9030XA Contusion of unspecified foot, initial encounter: Secondary | ICD-10-CM | POA: Insufficient documentation

## 2011-05-30 NOTE — Assessment & Plan Note (Signed)
Will obtain a x-ray. Further advise w/results

## 2011-05-30 NOTE — Patient Instructions (Signed)
Get the XR Rest with the leg elevated  Call if the pain is not better in few days or if it gets worse

## 2011-05-30 NOTE — Progress Notes (Signed)
  Subjective:    Patient ID: Sierra Klein, female    DOB: Aug 21, 1931, 77 y.o.   MRN: [id number]  HPI 5 days ago hit her L foot when going upstairs; c/o pain since then, some swelling which developed 2 days after the injury  Past Medical History  Diagnosis Date  . Disturbance, sleep     narcolepsy  . Diabetes mellitus   . B12 deficiency anemia   . 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 Osteoarthritis   . Hyperlipidemia     started med 6/09  . Diverticulosis      Review of Systems No other injuries, no neck pain or back pain Taking her regular pain meds which she takes for neuropathy    Objective:   Physical Exam  Constitutional: She is oriented to person, place, and time. She appears well-developed and well-nourished.  Musculoskeletal:       Feet:       No LE edema, normal L pedal pulse   Neurological: She is alert and oriented to person, place, and time.          Assessment & Plan:

## 2011-05-31 ENCOUNTER — Telehealth: Payer: Self-pay

## 2011-05-31 NOTE — Telephone Encounter (Signed)
Message copied by Beverely Low on Wed May 31, 2011  9:59 AM ------      Message from: Wanda Plump      Created: Tue May 30, 2011  5:12 PM       Advise patient, she does have a nondisplaced fracture.      Plan:      Buddy taping (with a piece of cotton between the toes) for at least  2 weeks or until pain resolved.      Use  wide shoes      If the pain is not better in 4 weeks or the area  Gets  more swollen or tender---->  let me know

## 2011-05-31 NOTE — Telephone Encounter (Signed)
Pt aware of results and verbalized understanding.  

## 2011-06-13 ENCOUNTER — Ambulatory Visit (INDEPENDENT_AMBULATORY_CARE_PROVIDER_SITE_OTHER): Payer: Medicare Other | Admitting: *Deleted

## 2011-06-13 ENCOUNTER — Encounter: Payer: Self-pay | Admitting: *Deleted

## 2011-06-13 DIAGNOSIS — E538 Deficiency of other specified B group vitamins: Secondary | ICD-10-CM

## 2011-06-13 MED ORDER — CYANOCOBALAMIN 1000 MCG/ML IJ SOLN
1000.0000 ug | Freq: Once | INTRAMUSCULAR | Status: AC
  Administered 2011-06-13: 1000 ug via INTRAMUSCULAR

## 2011-06-27 HISTORY — PX: LAPAROSCOPIC LYSIS OF ADHESIONS: SHX5905

## 2011-07-04 ENCOUNTER — Ambulatory Visit: Payer: Medicare Other | Admitting: Internal Medicine

## 2011-07-11 ENCOUNTER — Ambulatory Visit: Payer: Medicare Other

## 2011-07-14 DIAGNOSIS — G4710 Hypersomnia, unspecified: Secondary | ICD-10-CM | POA: Diagnosis not present

## 2011-07-14 DIAGNOSIS — R269 Unspecified abnormalities of gait and mobility: Secondary | ICD-10-CM | POA: Diagnosis not present

## 2011-07-14 DIAGNOSIS — G603 Idiopathic progressive neuropathy: Secondary | ICD-10-CM | POA: Diagnosis not present

## 2011-07-17 ENCOUNTER — Encounter: Payer: Self-pay | Admitting: Internal Medicine

## 2011-07-17 ENCOUNTER — Ambulatory Visit (INDEPENDENT_AMBULATORY_CARE_PROVIDER_SITE_OTHER): Payer: Medicare Other | Admitting: Internal Medicine

## 2011-07-17 VITALS — BP 102/68 | HR 66 | Temp 98.1°F | Resp 14 | Wt 156.4 lb

## 2011-07-17 DIAGNOSIS — R197 Diarrhea, unspecified: Secondary | ICD-10-CM

## 2011-07-17 DIAGNOSIS — E785 Hyperlipidemia, unspecified: Secondary | ICD-10-CM

## 2011-07-17 DIAGNOSIS — E119 Type 2 diabetes mellitus without complications: Secondary | ICD-10-CM | POA: Diagnosis not present

## 2011-07-17 DIAGNOSIS — D518 Other vitamin B12 deficiency anemias: Secondary | ICD-10-CM

## 2011-07-17 DIAGNOSIS — R1031 Right lower quadrant pain: Secondary | ICD-10-CM

## 2011-07-17 LAB — LIPID PANEL
Cholesterol: 177 mg/dL (ref 0–200)
HDL: 43.9 mg/dL
LDL Cholesterol: 99 mg/dL (ref 0–99)
Total CHOL/HDL Ratio: 4
Triglycerides: 173 mg/dL — ABNORMAL HIGH (ref 0.0–149.0)
VLDL: 34.6 mg/dL (ref 0.0–40.0)

## 2011-07-17 MED ORDER — CYANOCOBALAMIN 1000 MCG/ML IJ SOLN
1000.0000 ug | Freq: Once | INTRAMUSCULAR | Status: AC
  Administered 2011-07-17: 1000 ug via INTRAMUSCULAR

## 2011-07-17 NOTE — Assessment & Plan Note (Signed)
Improved after she started probiotics

## 2011-07-17 NOTE — Assessment & Plan Note (Signed)
Today she reports a very mild and occasional pain. Symptoms improved

## 2011-07-17 NOTE — Assessment & Plan Note (Addendum)
Well controlled per last hemoglobin A1c, on diet only. Labs

## 2011-07-17 NOTE — Assessment & Plan Note (Addendum)
Due for labs, on diet only

## 2011-07-17 NOTE — Progress Notes (Signed)
  Subjective:    Patient ID: Sierra Klein, female    DOB: 1931-10-27, 78 y.o.   MRN: [id number]  HPI ROV Feels well   Past Medical History: Diabetes mellitus Hyperlipidemia, started meds 6-09 Neuropathy Dx 1998, not related to DM, w/u neg (idiopatic Progressive Neuropathy) ---f/u Dr. Anne Hahn in [place] Narcolepsy B 12 deficiency anemia  Osteoarthritis 2005: (-) CAROTID U/S AND CARDIOLITE DEXA normal 7-10   Past Surgical History: Hysterectomy, no oophorectomy  L cataract surgery 9-09 R cataract surgery 3/10  Family History: CAD - sis, M (tachycardia), bro HTN - Bro increased chol - bro alzheimer-M DM - no colon Ca - no breast Ca - no  Social History: Married x 57 years children x 2 Former Smoker Alcohol use-yes wine 3-4x/wk Drug use-no Regular exercise-- increasingly diff. to exercise d/t lack of balance (neuropathy) still drives      Review of Systems Hyperlipidemia on diet control only. Diabetes on diet control only  Neuropathy relatively well controlled with hydrocodone and the other medicines. Had diarrhea for a while, better after she started probiotics Good compliance with her monthly B12 shots Had a injury at the left wrist, improving    Objective:   Physical Exam  Constitutional: She appears well-developed and well-nourished.  Cardiovascular: Normal rate, regular rhythm and normal heart sounds.   No murmur heard. Pulmonary/Chest: Effort normal and breath sounds normal. No respiratory distress. She has no wheezes. She has no rales.  Musculoskeletal: She exhibits no edema.       Left wrist with full range of motion and no deformities. She does have a superficial cyst or hematoma about 1 cm in size, at the dorsal aspect of the wrist       Assessment & Plan:

## 2011-07-20 ENCOUNTER — Encounter: Payer: Self-pay | Admitting: Internal Medicine

## 2011-08-17 ENCOUNTER — Ambulatory Visit: Payer: Medicare Other

## 2011-10-26 DIAGNOSIS — H43819 Vitreous degeneration, unspecified eye: Secondary | ICD-10-CM | POA: Diagnosis not present

## 2011-10-26 DIAGNOSIS — H04129 Dry eye syndrome of unspecified lacrimal gland: Secondary | ICD-10-CM | POA: Diagnosis not present

## 2011-10-26 DIAGNOSIS — H2640 Unspecified secondary cataract: Secondary | ICD-10-CM | POA: Diagnosis not present

## 2011-10-26 DIAGNOSIS — E119 Type 2 diabetes mellitus without complications: Secondary | ICD-10-CM | POA: Diagnosis not present

## 2011-10-30 ENCOUNTER — Encounter: Payer: Self-pay | Admitting: *Deleted

## 2011-10-30 DIAGNOSIS — L600 Ingrowing nail: Secondary | ICD-10-CM | POA: Diagnosis not present

## 2011-11-01 DIAGNOSIS — Z1231 Encounter for screening mammogram for malignant neoplasm of breast: Secondary | ICD-10-CM | POA: Diagnosis not present

## 2011-11-01 DIAGNOSIS — Z124 Encounter for screening for malignant neoplasm of cervix: Secondary | ICD-10-CM | POA: Diagnosis not present

## 2011-11-01 DIAGNOSIS — N949 Unspecified condition associated with female genital organs and menstrual cycle: Secondary | ICD-10-CM | POA: Diagnosis not present

## 2011-11-02 DIAGNOSIS — H26499 Other secondary cataract, unspecified eye: Secondary | ICD-10-CM | POA: Diagnosis not present

## 2011-11-02 DIAGNOSIS — H2640 Unspecified secondary cataract: Secondary | ICD-10-CM | POA: Diagnosis not present

## 2011-11-14 ENCOUNTER — Ambulatory Visit (INDEPENDENT_AMBULATORY_CARE_PROVIDER_SITE_OTHER): Payer: Medicare Other | Admitting: Internal Medicine

## 2011-11-14 ENCOUNTER — Other Ambulatory Visit: Payer: Self-pay | Admitting: Obstetrics & Gynecology

## 2011-11-14 VITALS — BP 112/74 | HR 66 | Temp 97.9°F | Ht 65.0 in | Wt 157.0 lb

## 2011-11-14 DIAGNOSIS — E785 Hyperlipidemia, unspecified: Secondary | ICD-10-CM | POA: Diagnosis not present

## 2011-11-14 DIAGNOSIS — G478 Other sleep disorders: Secondary | ICD-10-CM

## 2011-11-14 DIAGNOSIS — D239 Other benign neoplasm of skin, unspecified: Secondary | ICD-10-CM

## 2011-11-14 DIAGNOSIS — R1031 Right lower quadrant pain: Secondary | ICD-10-CM

## 2011-11-14 DIAGNOSIS — G479 Sleep disorder, unspecified: Secondary | ICD-10-CM

## 2011-11-14 DIAGNOSIS — G589 Mononeuropathy, unspecified: Secondary | ICD-10-CM | POA: Diagnosis not present

## 2011-11-14 DIAGNOSIS — R928 Other abnormal and inconclusive findings on diagnostic imaging of breast: Secondary | ICD-10-CM

## 2011-11-14 DIAGNOSIS — D518 Other vitamin B12 deficiency anemias: Secondary | ICD-10-CM | POA: Diagnosis not present

## 2011-11-14 DIAGNOSIS — Z0000 Encounter for general adult medical examination without abnormal findings: Secondary | ICD-10-CM

## 2011-11-14 DIAGNOSIS — E119 Type 2 diabetes mellitus without complications: Secondary | ICD-10-CM

## 2011-11-14 LAB — BASIC METABOLIC PANEL
BUN: 14 mg/dL (ref 6–23)
CO2: 27 mEq/L (ref 19–32)
Chloride: 107 mEq/L (ref 96–112)
Glucose, Bld: 127 mg/dL — ABNORMAL HIGH (ref 70–99)
Potassium: 3.8 mEq/L (ref 3.5–5.1)

## 2011-11-14 LAB — CBC WITH DIFFERENTIAL/PLATELET
Basophils Absolute: 0 10*3/uL (ref 0.0–0.1)
HCT: 41.8 % (ref 36.0–46.0)
Lymphs Abs: 1.2 10*3/uL (ref 0.7–4.0)
MCV: 88.6 fl (ref 78.0–100.0)
Monocytes Absolute: 0.4 10*3/uL (ref 0.1–1.0)
Platelets: 173 10*3/uL (ref 150.0–400.0)
RDW: 14.7 % — ABNORMAL HIGH (ref 11.5–14.6)

## 2011-11-14 LAB — HEMOGLOBIN A1C: Hgb A1c MFr Bld: 6.7 % — ABNORMAL HIGH (ref 4.6–6.5)

## 2011-11-14 MED ORDER — CYANOCOBALAMIN 1000 MCG/ML IJ SOLN
1000.0000 ug | Freq: Once | INTRAMUSCULAR | Status: AC
  Administered 2011-11-14: 1000 ug via INTRAMUSCULAR

## 2011-11-14 NOTE — Assessment & Plan Note (Signed)
On diet control only, check a hemoglobin A1c

## 2011-11-14 NOTE — Assessment & Plan Note (Signed)
Today she reports continue with lower abdominal pain, worse on the right. To have surgery with Dr. Lloyd Huger, her gynecologist, they think symptoms related to adhesions.

## 2011-11-14 NOTE — Assessment & Plan Note (Signed)
Well controlled on diet only. 

## 2011-11-14 NOTE — Assessment & Plan Note (Signed)
Got her shot today 

## 2011-11-14 NOTE — Assessment & Plan Note (Signed)
Well-controlled on Ambien, symptoms mostly related to anxiety, denies the need of further anxiety treatment.

## 2011-11-14 NOTE — Progress Notes (Signed)
  Subjective:    Patient ID: Sierra Klein, female    DOB: Nov 27, 1931, 78 y.o.   MRN: [id number]  HPI Here for Medicare AWV: 1.Risk factors based on Past M, S, F history: reviewed  2.Physical Activities: does some walking, house chores, some yard work  3.Depression/mood: worries a lot about "everything", some anxiety, no depression, no worse            compared to last year, on ambien b/c anxiety, not b/c insomnia perse.  4.Hearing: mild problems, none noted today at the visit , this year again declined to see a hearing specialist 5.ADL's: independent, still drives   6.Fall Risk: no recent falls, admits to poor balance d/t neuropathy, fall prevention discussed  7.Home Safety:  does feel safe at home  8.Height, weight, &visual acuity: see Vs, vision corrected w/ glasses, saw eye doctor , had laser treatment for cataracts 2 weeks ago  9. Counseling: yes  10. Labs ordered based on risk factors: yes  11.           Referral Coordination, if needed  12.           Care Plan, see a/p 13.            Cognitive Assessment:  motor skills,cognition and mood seem appropriate  in addition we discussed the following issues History of narcolepsy, has not required provigil in a while. History of lower abdominal pain, Dr. Lloyd Huger, her gynecologist will perform surgery soon, they think the pain is from adhesions. Neuropathy, symptoms relatively well controlled with Neurontin, hydrocodone at night and Cymbalta. Diabetes, on no medications, checks CBGs rarely, they have always been essentially wnl. High cholesterol, on no medications at this point. History of B12 deficiency, due for her shot today.    Past Medical History: Diabetes mellitus Hyperlipidemia, started meds 6-09 Neuropathy Dx 1998, not related to DM, w/u neg (idiopatic Progressive Neuropathy) ---f/u Dr. Anne Hahn in [place]  Narcolepsy,  provigil prn B 12 deficiency anemia   Osteoarthritis 2005: (-) CAROTID U/S AND CARDIOLITE DEXA normal 7-10    Past Surgical History: Hysterectomy, no oophorectomy   L cataract surgery 9-09 R cataract surgery 3/10  Family History: CAD - sis, M (tachycardia), bro HTN - Bro increased chol - bro alzheimer-M DM - no colon Ca - no breast Ca - no  Social History: Married x 58 years, children x 2 Quit tobacco-- years ago Alcohol use-yes wine 3-4x/wk Drug use-no Regular exercise-- increasingly diff. to exercise d/t lack of balance (neuropathy) still drives      Review of Systems No chest shortness of breath No nausea, vomiting, diarrhea or blood in the stools.     Objective:   Physical Exam General -- alert, well-developed, and well-nourished.   Neck --no thyromegaly , normal carotid pulse Lungs -- normal respiratory effort, no intercostal retractions, no accessory muscle use, and normal breath sounds.   Heart-- normal rate, regular rhythm, no murmur, and no gallop.   Abdomen--soft, nondistended, slightly tender at the R>L lower abdomen, no mass or rebound   Extremities-- no pretibial edema bilaterally Neurologic-- alert & oriented X3 and strength normal in all extremities. Psych-- Cognition and judgment appear intact. Alert and cooperative with normal attention span and concentration.  not anxious appearing and not depressed appearing.      Assessment & Plan:

## 2011-11-14 NOTE — Assessment & Plan Note (Addendum)
Td 2007 Pneumonia shot 2007 shingles shot  2008  sees gyn for female care  DEXA12-07 (-) and 7-10 normal ---> cont w/ Calcium, vit d  Last Cscope 03-2011 Dr. Russella Dar , had diverticuli and diverticulitis, next per GI  has a healthy life style

## 2011-11-14 NOTE — Assessment & Plan Note (Signed)
Followup by Dr. Anne Hahn, on Neurontin, hydrocodone at night and Cymbalta

## 2011-11-15 ENCOUNTER — Encounter: Payer: Self-pay | Admitting: Internal Medicine

## 2011-11-17 ENCOUNTER — Encounter: Payer: Self-pay | Admitting: *Deleted

## 2011-12-01 ENCOUNTER — Ambulatory Visit
Admission: RE | Admit: 2011-12-01 | Discharge: 2011-12-01 | Disposition: A | Discharge status: Home / Self Care | Payer: Medicare Other | Source: Ambulatory Visit | Attending: Obstetrics & Gynecology | Admitting: Obstetrics & Gynecology

## 2011-12-01 DIAGNOSIS — R928 Other abnormal and inconclusive findings on diagnostic imaging of breast: Secondary | ICD-10-CM

## 2011-12-06 DIAGNOSIS — N949 Unspecified condition associated with female genital organs and menstrual cycle: Secondary | ICD-10-CM | POA: Diagnosis not present

## 2011-12-12 ENCOUNTER — Ambulatory Visit (INDEPENDENT_AMBULATORY_CARE_PROVIDER_SITE_OTHER): Payer: Medicare Other | Admitting: *Deleted

## 2011-12-12 DIAGNOSIS — D518 Other vitamin B12 deficiency anemias: Secondary | ICD-10-CM | POA: Diagnosis not present

## 2011-12-12 MED ORDER — CYANOCOBALAMIN 1000 MCG/ML IJ SOLN
1000.0000 ug | Freq: Once | INTRAMUSCULAR | Status: AC
  Administered 2011-12-12: 1000 ug via INTRAMUSCULAR

## 2012-01-02 DIAGNOSIS — N949 Unspecified condition associated with female genital organs and menstrual cycle: Secondary | ICD-10-CM | POA: Diagnosis not present

## 2012-01-05 ENCOUNTER — Other Ambulatory Visit: Payer: Self-pay | Admitting: Obstetrics & Gynecology

## 2012-01-05 DIAGNOSIS — N736 Female pelvic peritoneal adhesions (postinfective): Secondary | ICD-10-CM | POA: Diagnosis not present

## 2012-01-05 DIAGNOSIS — N839 Noninflammatory disorder of ovary, fallopian tube and broad ligament, unspecified: Secondary | ICD-10-CM | POA: Diagnosis not present

## 2012-01-05 DIAGNOSIS — N949 Unspecified condition associated with female genital organs and menstrual cycle: Secondary | ICD-10-CM | POA: Diagnosis not present

## 2012-01-16 ENCOUNTER — Ambulatory Visit (INDEPENDENT_AMBULATORY_CARE_PROVIDER_SITE_OTHER): Payer: Medicare Other | Admitting: *Deleted

## 2012-01-16 DIAGNOSIS — D518 Other vitamin B12 deficiency anemias: Secondary | ICD-10-CM

## 2012-01-16 MED ORDER — CYANOCOBALAMIN 1000 MCG/ML IJ SOLN
1000.0000 ug | Freq: Once | INTRAMUSCULAR | Status: AC
  Administered 2012-01-16: 1000 ug via INTRAMUSCULAR

## 2012-02-13 DIAGNOSIS — G4710 Hypersomnia, unspecified: Secondary | ICD-10-CM | POA: Diagnosis not present

## 2012-02-13 DIAGNOSIS — G5600 Carpal tunnel syndrome, unspecified upper limb: Secondary | ICD-10-CM | POA: Diagnosis not present

## 2012-02-13 DIAGNOSIS — G603 Idiopathic progressive neuropathy: Secondary | ICD-10-CM | POA: Diagnosis not present

## 2012-02-16 ENCOUNTER — Ambulatory Visit (INDEPENDENT_AMBULATORY_CARE_PROVIDER_SITE_OTHER): Payer: Medicare Other | Admitting: *Deleted

## 2012-02-16 DIAGNOSIS — D518 Other vitamin B12 deficiency anemias: Secondary | ICD-10-CM

## 2012-02-16 MED ORDER — CYANOCOBALAMIN 1000 MCG/ML IJ SOLN
1000.0000 ug | Freq: Once | INTRAMUSCULAR | Status: AC
  Administered 2012-02-16: 1000 ug via INTRAMUSCULAR

## 2012-03-19 ENCOUNTER — Ambulatory Visit (INDEPENDENT_AMBULATORY_CARE_PROVIDER_SITE_OTHER): Payer: Medicare Other | Admitting: *Deleted

## 2012-03-19 DIAGNOSIS — E538 Deficiency of other specified B group vitamins: Secondary | ICD-10-CM | POA: Diagnosis not present

## 2012-03-19 MED ORDER — CYANOCOBALAMIN 1000 MCG/ML IJ SOLN
1000.0000 ug | Freq: Once | INTRAMUSCULAR | Status: AC
  Administered 2012-03-19: 1000 ug via INTRAMUSCULAR

## 2012-04-18 ENCOUNTER — Ambulatory Visit (INDEPENDENT_AMBULATORY_CARE_PROVIDER_SITE_OTHER): Payer: Medicare Other

## 2012-04-18 DIAGNOSIS — D518 Other vitamin B12 deficiency anemias: Secondary | ICD-10-CM

## 2012-04-18 DIAGNOSIS — Z23 Encounter for immunization: Secondary | ICD-10-CM

## 2012-04-18 MED ORDER — CYANOCOBALAMIN 1000 MCG/ML IJ SOLN
1000.0000 ug | Freq: Once | INTRAMUSCULAR | Status: AC
  Administered 2012-04-18: 1000 ug via INTRAMUSCULAR

## 2012-05-17 ENCOUNTER — Ambulatory Visit (INDEPENDENT_AMBULATORY_CARE_PROVIDER_SITE_OTHER): Payer: Medicare Other | Admitting: Internal Medicine

## 2012-05-17 VITALS — BP 124/64 | HR 62 | Temp 97.6°F | Wt 151.0 lb

## 2012-05-17 DIAGNOSIS — E785 Hyperlipidemia, unspecified: Secondary | ICD-10-CM | POA: Diagnosis not present

## 2012-05-17 DIAGNOSIS — E119 Type 2 diabetes mellitus without complications: Secondary | ICD-10-CM | POA: Diagnosis not present

## 2012-05-17 DIAGNOSIS — R197 Diarrhea, unspecified: Secondary | ICD-10-CM | POA: Diagnosis not present

## 2012-05-17 DIAGNOSIS — R1031 Right lower quadrant pain: Secondary | ICD-10-CM | POA: Diagnosis not present

## 2012-05-17 DIAGNOSIS — M1990 Unspecified osteoarthritis, unspecified site: Secondary | ICD-10-CM

## 2012-05-17 LAB — CBC WITH DIFFERENTIAL/PLATELET
Basophils Relative: 0.4 % (ref 0.0–3.0)
Eosinophils Absolute: 0.1 10*3/uL (ref 0.0–0.7)
Lymphs Abs: 1.3 10*3/uL (ref 0.7–4.0)
MCHC: 31.7 g/dL (ref 30.0–36.0)
MCV: 88.1 fl (ref 78.0–100.0)
Monocytes Absolute: 0.4 10*3/uL (ref 0.1–1.0)
Neutrophils Relative %: 59.6 % (ref 43.0–77.0)
Platelets: 189 10*3/uL (ref 150.0–400.0)
RBC: 4.84 Mil/uL (ref 3.87–5.11)

## 2012-05-17 LAB — LIPID PANEL
Total CHOL/HDL Ratio: 4
Triglycerides: 163 mg/dL — ABNORMAL HIGH (ref 0.0–149.0)

## 2012-05-17 NOTE — Assessment & Plan Note (Signed)
Diarrhea is her main complaint today. She's not taking Colace, not taking new medicines, last colonoscopy 03-2011. Recent TSHs normal. Mild  weight loss. Plan:  Discontinue probiotics, stool studies, if not improving, may need to see GI.

## 2012-05-17 NOTE — Patient Instructions (Signed)
Stool studies: WBC, Hemoccult, culture--- dx diarrhea ---- For pain: Knee sleeves  Tylenol  500 mg OTC 2 tabs a day every 8 hours as needed for pain

## 2012-05-17 NOTE — Assessment & Plan Note (Signed)
S/p pelvic surgery for adhesion, Dr Jennette Kettle, sx gone

## 2012-05-17 NOTE — Assessment & Plan Note (Signed)
Due for a1c  

## 2012-05-17 NOTE — Assessment & Plan Note (Signed)
knee pain, bilateral likely from DJD. Recommend Tylenol and knee support.

## 2012-05-17 NOTE — Assessment & Plan Note (Signed)
Re do labs  

## 2012-05-17 NOTE — Progress Notes (Signed)
  Subjective:    Patient ID: Sierra Klein, female    DOB: 1932-02-24, 78 y.o.   MRN: [id number]  HPI Routine office visit Her main concern today is diarrhea, goes to the bathroom 3 or 4 times a day, stools are described as soft very, no blood, occasionally sees mucus. Also, had right lower quadrant pain, better after they did surgery, see assessment and plan Also complains of bilateral knee pain, mostly when she goes up or down stairs. Denies any swelling, not taking any specific medication, has not seen at the specialist anytime  Past Medical History  Diagnosis Date  . Disturbance, sleep     narcolepsy  . Diabetes mellitus   . B12 deficiency anemia   . 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 Osteoarthritis   . Hyperlipidemia     started med 6/09  . Diverticulosis    Past Surgical History  Procedure Date  . Abdominal hysterectomy     no oophorectomy  . Cataract extraction     l- 9/09, r- 3/10  . Colonoscopy 2002  . Belpharoptosis repair   . Breast biopsy 09/1983    left  . Foot surgery 03/1988    cyst removed from right heel  . Pelvic surgery 2003    cysto-recto-enetero cele repair   . Laparoscopic lysis of adhesions 2013     RLQ pain resolved after surgery   Social History:  Married x 58 years, children x 2  Quit tobacco-- years ago  Alcohol use-yes wine 3-4x/wk  Drug use-no  Regular exercise-- increasingly diff. to exercise d/t lack of balance (neuropathy)  still drives    Review of Systems Medication list reviewed, good compliance Denies fever or chills, has lost about 4 pounds in the last few months. No  nausea, vomiting.     Objective:   Physical Exam General -- alert, well-developed, and well-nourished.    Lungs -- normal respiratory effort, no intercostal retractions, no accessory muscle use, and normal breath sounds.   Heart-- normal rate, regular rhythm, no murmur, and no gallop.     Abdomen--soft, non-tender, no distention, no masses, no HSM, no guarding, and no rigidity.   Extremities-- no pretibial edema bilaterally; changes consistent with DJD in both knees, no effusion or warmness Psych-- Cognition and judgment appear intact. Alert and cooperative with normal attention span and concentration.  not anxious appearing and not depressed appearing.       Assessment & Plan:

## 2012-05-19 ENCOUNTER — Encounter: Payer: Self-pay | Admitting: Internal Medicine

## 2012-05-20 ENCOUNTER — Encounter: Payer: Self-pay | Admitting: *Deleted

## 2012-06-03 DIAGNOSIS — R197 Diarrhea, unspecified: Secondary | ICD-10-CM | POA: Diagnosis not present

## 2012-06-03 NOTE — Addendum Note (Signed)
Addended by: Silvio Pate D on: 06/03/2012 01:59 PM   Modules accepted: Orders

## 2012-06-08 LAB — STOOL CULTURE

## 2012-06-17 ENCOUNTER — Other Ambulatory Visit (INDEPENDENT_AMBULATORY_CARE_PROVIDER_SITE_OTHER): Payer: Medicare Other

## 2012-06-17 DIAGNOSIS — Z1289 Encounter for screening for malignant neoplasm of other sites: Secondary | ICD-10-CM

## 2012-06-17 LAB — POC HEMOCCULT BLD/STL (OFFICE/1-CARD/DIAGNOSTIC): Fecal Occult Blood, POC: NEGATIVE

## 2012-06-18 ENCOUNTER — Encounter: Payer: Self-pay | Admitting: *Deleted

## 2012-07-30 ENCOUNTER — Ambulatory Visit (INDEPENDENT_AMBULATORY_CARE_PROVIDER_SITE_OTHER): Payer: Medicare Other | Admitting: Family Medicine

## 2012-07-30 ENCOUNTER — Encounter: Payer: Self-pay | Admitting: Family Medicine

## 2012-07-30 VITALS — BP 120/74 | HR 78 | Temp 98.8°F | Wt 154.4 lb

## 2012-07-30 DIAGNOSIS — J069 Acute upper respiratory infection, unspecified: Secondary | ICD-10-CM | POA: Diagnosis not present

## 2012-07-30 MED ORDER — GUAIFENESIN-CODEINE 100-10 MG/5ML PO SYRP: ORAL_SOLUTION | ORAL | Status: DC

## 2012-07-30 NOTE — Progress Notes (Signed)
  Subjective:     Sierra Klein is a 79 y.o. female here for evaluation of a cough. Onset of symptoms was 2 days ago. Symptoms have been gradually worsening since that time. The cough is dry and is aggravated by reclining position. Associated symptoms include: postnasal drip and sputum production. Patient does not have a history of asthma. Patient does not have a history of environmental allergens. Patient has not traveled recently. Patient does not have a history of smoking. Patient has not had a previous chest x-ray. Patient has not had a PPD done.  The following portions of the patient's history were reviewed and updated as appropriate: allergies, current medications, past family history, past medical history, past social history, past surgical history and problem list.  Review of Systems Pertinent items are noted in HPI.    Objective:    Oxygen saturation 97% on room air BP 120/74  Pulse 78  Temp 98.8 F (37.1 C) (Oral)  Wt 154 lb 6.4 oz (70.035 kg)  SpO2 97% General appearance: alert, cooperative, appears stated age and no distress Ears: normal TM's and external ear canals both ears Nose: yellow discharge, mild congestion, turbinates red Throat: lips, mucosa, and tongue normal; teeth and gums normal Neck: no adenopathy, supple, symmetrical, trachea midline and thyroid not enlarged, symmetric, no tenderness/mass/nodules Lungs: clear to auscultation bilaterally Heart: S1, S2 normal    Assessment:    URI with Post Nasal Drip    Plan:    Explained lack of efficacy of antibiotics in viral disease. Antitussives per medication orders. Avoid exposure to tobacco smoke and fumes. Call if shortness of breath worsens, blood in sputum, change in character of cough, development of fever or chills, inability to maintain nutrition and hydration. Avoid exposure to tobacco smoke and fumes. Trial of antihistamines. Trial of steroid nasal spray.

## 2012-07-30 NOTE — Patient Instructions (Addendum)

## 2012-08-13 DIAGNOSIS — M25549 Pain in joints of unspecified hand: Secondary | ICD-10-CM | POA: Diagnosis not present

## 2012-08-13 DIAGNOSIS — G603 Idiopathic progressive neuropathy: Secondary | ICD-10-CM | POA: Diagnosis not present

## 2012-08-13 DIAGNOSIS — G4710 Hypersomnia, unspecified: Secondary | ICD-10-CM | POA: Diagnosis not present

## 2012-09-17 ENCOUNTER — Ambulatory Visit: Payer: Medicare Other | Admitting: Internal Medicine

## 2012-09-24 ENCOUNTER — Telehealth: Payer: Self-pay | Admitting: Gastroenterology

## 2012-09-24 NOTE — Telephone Encounter (Signed)
OK with me.

## 2012-09-24 NOTE — Telephone Encounter (Signed)
Dr. Russella Dar this pt would like to see Dr. Marina Goodell. Will you approve the switch?

## 2012-09-25 NOTE — Telephone Encounter (Signed)
Please schedule this pt to see Dr. Marina Goodell as a new pt, Thanks.

## 2012-09-25 NOTE — Telephone Encounter (Signed)
Okay with me 

## 2012-09-25 NOTE — Telephone Encounter (Signed)
Dr. Marina Goodell this pt wants to switch care from Dr. Russella Dar and become your patient. Dr. Russella Dar has approved the switch. Will you accept her as a pt? Please advise.

## 2012-09-27 NOTE — Telephone Encounter (Signed)
Patient scheduled with Dr. Marina Goodell

## 2012-11-04 ENCOUNTER — Encounter: Payer: Self-pay | Admitting: Internal Medicine

## 2012-11-04 ENCOUNTER — Ambulatory Visit (INDEPENDENT_AMBULATORY_CARE_PROVIDER_SITE_OTHER): Payer: Medicare Other | Admitting: Internal Medicine

## 2012-11-04 VITALS — BP 132/74 | HR 64 | Ht 65.0 in | Wt 154.8 lb

## 2012-11-04 DIAGNOSIS — R159 Full incontinence of feces: Secondary | ICD-10-CM

## 2012-11-04 DIAGNOSIS — R143 Flatulence: Secondary | ICD-10-CM

## 2012-11-04 DIAGNOSIS — R141 Gas pain: Secondary | ICD-10-CM | POA: Diagnosis not present

## 2012-11-04 DIAGNOSIS — K5730 Diverticulosis of large intestine without perforation or abscess without bleeding: Secondary | ICD-10-CM

## 2012-11-04 DIAGNOSIS — R197 Diarrhea, unspecified: Secondary | ICD-10-CM

## 2012-11-04 DIAGNOSIS — K8020 Calculus of gallbladder without cholecystitis without obstruction: Secondary | ICD-10-CM | POA: Diagnosis not present

## 2012-11-04 MED ORDER — DIPHENOXYLATE-ATROPINE 2.5-0.025 MG PO TABS: 1.0000 | ORAL_TABLET | Freq: Every day | ORAL | Status: DC | PRN

## 2012-11-04 NOTE — Progress Notes (Signed)
HISTORY OF PRESENT ILLNESS:  Sierra Klein is a 79 y.o. female with chronic unspecified neuropathy, and diverticulosis. She presents today for evaluation of chronic problems with her bowels. She describes the problem is "diarrhea". She describes intermittent problems for 10 years which have worsened over the past year. She generally has 4 bowel movements per day which are formed. However, approximately 2-3 times per week, without notice, she will have incontinence of liquid stool. She has been evaluated with multiple stool studies without revelations. Empiric trials of antibiotics, probiotics, and Imodium have not been helpful. She does wear a pad not protective undergarments. No bladder issues. She has had surgery including rectocele and cystocele repairs about 10 years ago. No new abdominal pain or weight loss. Her only other complaint is that of foul-smelling flatus. This bothers her. She did undergo complete colonoscopy with Dr. Russella Dar in October 2012. This revealed moderate diverticulosis in an area of diverticulitis, but was otherwise normal. Blood work from November 2013 including CBC was unremarkable. CT scan in May 2012 revealed an incidental cholelithiasis  REVIEW OF SYSTEMS:  All non-GI ROS negative except for insomnia, arthritis, fatigue, neuropathy with foot pain  Past Medical History  Diagnosis Date  . Disturbance, sleep     narcolepsy  . Diabetes mellitus   . B12 deficiency anemia   . 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 Osteoarthritis   . Hyperlipidemia     started med 6/09  . Diverticulosis   . Colon polyps   . Diverticulitis   . Hiatal hernia   . Diverticulosis     Past Surgical History  Procedure Laterality Date  . Abdominal hysterectomy      no oophorectomy  . Cataract extraction      l- 9/09, r- 3/10  . Colonoscopy  2002  . Belpharoptosis repair    . Breast biopsy  09/1983    left  . Foot  surgery  03/1988    cyst removed from right heel  . Pelvic surgery  2003    cysto-recto-enetero cele repair   . Laparoscopic lysis of adhesions  2013     RLQ pain resolved after surgery    Social History OSCAR FORMAN  reports that she quit smoking about 58 years ago. Her smoking use included Cigarettes. She smoked 0.00 packs per day. She has never used smokeless tobacco. She reports that she drinks about 2.4 ounces of alcohol per week. She reports that she does not use illicit drugs.  family history includes Alzheimer's disease in her mother; Coronary artery disease in her brother, mother, and sister; Diabetes in an unspecified family member; Hyperlipidemia in her brother; and Hypertension in her brother.  There is no history of Colon cancer and Breast cancer.  Allergies  Allergen Reactions  . Sulfonamide Derivatives        PHYSICAL EXAMINATION: Vital signs: BP 132/74  Pulse 64  Ht 5\' 5"  (1.651 m)  Wt 154 lb 12.8 oz (70.217 kg)  BMI 25.76 kg/m2  Constitutional: generally well-appearing, no acute distress Psychiatric: alert and oriented x3, cooperative Eyes: extraocular movements intact, anicteric, conjunctiva pink Mouth: oral pharynx moist, no lesions Neck: supple no lymphadenopathy Cardiovascular: heart regular rate and rhythm, no murmur Lungs: clear to auscultation bilaterally Abdomen: soft, nontender, nondistended, no obvious ascites, no peritoneal signs, normal bowel sounds, no organomegaly Rectal: Sensation is intact. Small external hemorrhoids. No mass or tenderness. Absence of sphincter tone Extremities: no  lower extremity edema bilaterally Skin: no lesions on visible extremities Neuro: No focal deficits.   ASSESSMENT:  #1. Fecal incontinence. Most certainly do to lack of anal sphincter tone. #2. Intestinal gas/foul-smelling flatus. #3. Diverticulosis #4. Asymptomatic cholelithiasis   PLAN:  #1. Long discussion today regarding her problem and limited treatment  options. #2. Discussed regular bathroom schedule in hopes of having bowel movements in a preemptive manner #3. Protective undergarments when outside the home #4. Trial of Lomotil one daily. Titrated to need. Hold for constipation #5. Discussion on intestinal gas #6. Literature on intestinal gas as well as anti-gas and flatulence dietary sheet provided for review #7. GI office followup in 4 weeks

## 2012-11-04 NOTE — Patient Instructions (Addendum)
We have sent the following medications to your pharmacy for you to pick up at your convenience:  Lomotil  - take this once a day as needed, stop taking if you get constipated.  You have been given some information on gas and gas prevention.  Implement a regular bathroom schedule and utilize protective undergarments as discussed with Dr. Marina Goodell.  Please follow up with Dr. Marina Goodell in 4 weeks

## 2012-11-12 DIAGNOSIS — S1093XA Contusion of unspecified part of neck, initial encounter: Secondary | ICD-10-CM | POA: Diagnosis not present

## 2012-11-12 DIAGNOSIS — R55 Syncope and collapse: Secondary | ICD-10-CM | POA: Diagnosis not present

## 2012-11-12 DIAGNOSIS — S7000XA Contusion of unspecified hip, initial encounter: Secondary | ICD-10-CM | POA: Diagnosis not present

## 2012-11-12 DIAGNOSIS — Z7982 Long term (current) use of aspirin: Secondary | ICD-10-CM | POA: Diagnosis not present

## 2012-11-12 DIAGNOSIS — M25559 Pain in unspecified hip: Secondary | ICD-10-CM | POA: Diagnosis not present

## 2012-11-12 DIAGNOSIS — Z79899 Other long term (current) drug therapy: Secondary | ICD-10-CM | POA: Diagnosis not present

## 2012-11-12 DIAGNOSIS — Z87891 Personal history of nicotine dependence: Secondary | ICD-10-CM | POA: Diagnosis not present

## 2012-11-12 DIAGNOSIS — G319 Degenerative disease of nervous system, unspecified: Secondary | ICD-10-CM | POA: Diagnosis not present

## 2012-11-12 DIAGNOSIS — G589 Mononeuropathy, unspecified: Secondary | ICD-10-CM | POA: Diagnosis not present

## 2012-11-12 DIAGNOSIS — Z9079 Acquired absence of other genital organ(s): Secondary | ICD-10-CM | POA: Diagnosis not present

## 2012-11-12 DIAGNOSIS — W19XXXA Unspecified fall, initial encounter: Secondary | ICD-10-CM | POA: Diagnosis not present

## 2012-12-04 ENCOUNTER — Ambulatory Visit: Payer: Medicare Other | Admitting: Internal Medicine

## 2012-12-09 DIAGNOSIS — H2640 Unspecified secondary cataract: Secondary | ICD-10-CM | POA: Diagnosis not present

## 2012-12-09 DIAGNOSIS — E119 Type 2 diabetes mellitus without complications: Secondary | ICD-10-CM | POA: Diagnosis not present

## 2012-12-09 DIAGNOSIS — H43819 Vitreous degeneration, unspecified eye: Secondary | ICD-10-CM | POA: Diagnosis not present

## 2012-12-09 DIAGNOSIS — Z961 Presence of intraocular lens: Secondary | ICD-10-CM | POA: Diagnosis not present

## 2012-12-11 ENCOUNTER — Encounter: Payer: Self-pay | Admitting: *Deleted

## 2012-12-11 ENCOUNTER — Encounter: Payer: Self-pay | Admitting: Internal Medicine

## 2012-12-11 ENCOUNTER — Ambulatory Visit (INDEPENDENT_AMBULATORY_CARE_PROVIDER_SITE_OTHER): Payer: Medicare Other | Admitting: Internal Medicine

## 2012-12-11 VITALS — BP 120/60 | HR 66 | Ht 64.5 in | Wt 152.0 lb

## 2012-12-11 DIAGNOSIS — R159 Full incontinence of feces: Secondary | ICD-10-CM

## 2012-12-11 DIAGNOSIS — K8020 Calculus of gallbladder without cholecystitis without obstruction: Secondary | ICD-10-CM | POA: Diagnosis not present

## 2012-12-11 DIAGNOSIS — R197 Diarrhea, unspecified: Secondary | ICD-10-CM

## 2012-12-11 MED ORDER — DIPHENOXYLATE-ATROPINE 2.5-0.025 MG PO TABS: 1.0000 | ORAL_TABLET | Freq: Every day | ORAL | Status: DC | PRN

## 2012-12-11 NOTE — Progress Notes (Signed)
HISTORY OF PRESENT ILLNESS:  Sierra Klein is a 79 y.o. female with the below listed medical history who presents today regarding fecal incontinence. She was originally seen may 12th 2014. We recommended regular bathroom schedule, effect of undergarments, and followup Lomotil. She has been compliant with each. Overall, she reports modest improvement with less accidents. 3 incontinent episodes since her last visit. No incontinent episodes over the past 2 weeks. Incontinence more likely to occur when bowels or loose. No new problems. She has been trying Lomotil once daily. Recently added Imodium with improved results.  REVIEW OF SYSTEMS:  All non-GI ROS negative   Past Medical History  Diagnosis Date  . Disturbance, sleep     narcolepsy  . Diabetes mellitus   . B12 deficiency anemia   . 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 Osteoarthritis   . Hyperlipidemia     started med 6/09  . Diverticulosis   . Colon polyps   . Diverticulitis   . Hiatal hernia   . Diverticulosis     Past Surgical History  Procedure Laterality Date  . Abdominal hysterectomy      no oophorectomy  . Cataract extraction      l- 9/09, r- 3/10  . Colonoscopy  2002  . Belpharoptosis repair    . Breast biopsy  09/1983    left  . Foot surgery  03/1988    cyst removed from right heel  . Pelvic surgery  2003    cysto-recto-enetero cele repair   . Laparoscopic lysis of adhesions  2013     RLQ pain resolved after surgery    Social History ROSANA FARNELL  reports that she quit smoking about 58 years ago. Her smoking use included Cigarettes. She smoked 0.00 packs per day. She has never used smokeless tobacco. She reports that she drinks about 2.4 ounces of alcohol per week. She reports that she does not use illicit drugs.  family history includes Alzheimer's disease in her mother; Coronary artery disease in her brother, mother, and sister; Diabetes  in an unspecified family member; Hyperlipidemia in her brother; and Hypertension in her brother.  There is no history of Colon cancer and Breast cancer.  Allergies  Allergen Reactions  . Sulfonamide Derivatives        PHYSICAL EXAMINATION: Vital signs: BP 120/60  Pulse 66  Ht 5' 4.5" (1.638 m)  Wt 152 lb (68.947 kg)  BMI 25.7 kg/m2 General: Well-developed, well-nourished, no acute distress Abdomen: Not reexamined . Psychiatric: alert and oriented x3. Cooperative   ASSESSMENT:  #1. Fecal incontinence #2. Intestinal gas #3. Asymptomatic cholelithiasis  PLAN:  #1. Continue with with regular bathroom schedule and protective undergarments #2. Continue with antidiarrheals as needed. Titrated to need. Refill as requested #3. Followup as needed

## 2012-12-11 NOTE — Patient Instructions (Addendum)
We have sent the following medications to your pharmacy for you to pick up at your convenience:  Lomotil  Please follow up with Dr. Marina Goodell as needed

## 2013-01-13 ENCOUNTER — Other Ambulatory Visit: Payer: Self-pay | Admitting: Internal Medicine

## 2013-01-16 DIAGNOSIS — H26499 Other secondary cataract, unspecified eye: Secondary | ICD-10-CM | POA: Diagnosis not present

## 2013-01-16 DIAGNOSIS — H2640 Unspecified secondary cataract: Secondary | ICD-10-CM | POA: Diagnosis not present

## 2013-02-11 DIAGNOSIS — M25549 Pain in joints of unspecified hand: Secondary | ICD-10-CM | POA: Diagnosis not present

## 2013-02-11 DIAGNOSIS — G603 Idiopathic progressive neuropathy: Secondary | ICD-10-CM | POA: Diagnosis not present

## 2013-02-11 DIAGNOSIS — G4710 Hypersomnia, unspecified: Secondary | ICD-10-CM | POA: Diagnosis not present

## 2013-02-27 ENCOUNTER — Other Ambulatory Visit: Payer: Self-pay | Admitting: Internal Medicine

## 2013-04-14 ENCOUNTER — Other Ambulatory Visit: Payer: Self-pay | Admitting: Obstetrics & Gynecology

## 2013-04-14 DIAGNOSIS — R921 Mammographic calcification found on diagnostic imaging of breast: Secondary | ICD-10-CM

## 2013-04-22 DIAGNOSIS — Z01419 Encounter for gynecological examination (general) (routine) without abnormal findings: Secondary | ICD-10-CM | POA: Diagnosis not present

## 2013-04-22 DIAGNOSIS — Z130 Encounter for screening for diseases of the blood and blood-forming organs and certain disorders involving the immune mechanism: Secondary | ICD-10-CM | POA: Diagnosis not present

## 2013-04-24 ENCOUNTER — Ambulatory Visit
Admission: RE | Admit: 2013-04-24 | Discharge: 2013-04-24 | Disposition: A | Discharge status: Home / Self Care | Payer: Medicare Other | Source: Ambulatory Visit | Attending: Obstetrics & Gynecology | Admitting: Obstetrics & Gynecology

## 2013-04-24 DIAGNOSIS — R928 Other abnormal and inconclusive findings on diagnostic imaging of breast: Secondary | ICD-10-CM | POA: Diagnosis not present

## 2013-04-24 DIAGNOSIS — R921 Mammographic calcification found on diagnostic imaging of breast: Secondary | ICD-10-CM

## 2013-05-08 ENCOUNTER — Other Ambulatory Visit: Payer: Self-pay | Admitting: Internal Medicine

## 2013-05-12 ENCOUNTER — Telehealth: Payer: Self-pay

## 2013-05-12 NOTE — Telephone Encounter (Signed)
Medication and allergies: reviewed and updated  90 day supply/mail order: na [facility]   Immunizations due:  Admin flu vaccine today  A/P:   No changes to FH or [facility]  Tdap--04/2006 PNA--04/2006 Shingles--09/2006 MMG-03/2013 CCS--03/2013--no recall due to age  To Discuss with Provider: Not at this time

## 2013-05-13 ENCOUNTER — Ambulatory Visit (INDEPENDENT_AMBULATORY_CARE_PROVIDER_SITE_OTHER): Payer: Medicare Other | Admitting: Internal Medicine

## 2013-05-13 ENCOUNTER — Encounter: Payer: Self-pay | Admitting: Internal Medicine

## 2013-05-13 VITALS — BP 129/77 | HR 75 | Temp 98.1°F | Ht 65.2 in | Wt 151.0 lb

## 2013-05-13 DIAGNOSIS — E119 Type 2 diabetes mellitus without complications: Secondary | ICD-10-CM | POA: Diagnosis not present

## 2013-05-13 DIAGNOSIS — G479 Sleep disorder, unspecified: Secondary | ICD-10-CM

## 2013-05-13 DIAGNOSIS — Z0000 Encounter for general adult medical examination without abnormal findings: Secondary | ICD-10-CM

## 2013-05-13 DIAGNOSIS — E538 Deficiency of other specified B group vitamins: Secondary | ICD-10-CM | POA: Diagnosis not present

## 2013-05-13 DIAGNOSIS — G589 Mononeuropathy, unspecified: Secondary | ICD-10-CM

## 2013-05-13 DIAGNOSIS — D518 Other vitamin B12 deficiency anemias: Secondary | ICD-10-CM

## 2013-05-13 DIAGNOSIS — E785 Hyperlipidemia, unspecified: Secondary | ICD-10-CM

## 2013-05-13 DIAGNOSIS — K8020 Calculus of gallbladder without cholecystitis without obstruction: Secondary | ICD-10-CM

## 2013-05-13 DIAGNOSIS — Z23 Encounter for immunization: Secondary | ICD-10-CM | POA: Diagnosis not present

## 2013-05-13 LAB — CBC WITH DIFFERENTIAL/PLATELET
Basophils Relative: 0.4 % (ref 0.0–3.0)
Eosinophils Relative: 2.8 % (ref 0.0–5.0)
HCT: 39.8 % (ref 36.0–46.0)
Hemoglobin: 13.2 g/dL (ref 12.0–15.0)
Lymphs Abs: 1.3 10*3/uL (ref 0.7–4.0)
MCV: 85.6 fl (ref 78.0–100.0)
Monocytes Absolute: 0.6 10*3/uL (ref 0.1–1.0)
Monocytes Relative: 10.8 % (ref 3.0–12.0)
RBC: 4.65 Mil/uL (ref 3.87–5.11)
WBC: 5.5 10*3/uL (ref 4.5–10.5)

## 2013-05-13 LAB — COMPREHENSIVE METABOLIC PANEL
AST: 29 U/L (ref 0–37)
Alkaline Phosphatase: 97 U/L (ref 39–117)
BUN: 14 mg/dL (ref 6–23)
Creatinine, Ser: 0.6 mg/dL (ref 0.4–1.2)
Potassium: 4 mEq/L (ref 3.5–5.1)
Total Bilirubin: 1 mg/dL (ref 0.3–1.2)

## 2013-05-13 LAB — LIPID PANEL
Total CHOL/HDL Ratio: 5
VLDL: 48.2 mg/dL — ABNORMAL HIGH (ref 0.0–40.0)

## 2013-05-13 LAB — LDL CHOLESTEROL, DIRECT: Direct LDL: 131.6 mg/dL

## 2013-05-13 LAB — VITAMIN B12: Vitamin B-12: >1500 pg/mL — ABNORMAL HIGH (ref 211–911)

## 2013-05-13 LAB — HEMOGLOBIN A1C: Hgb A1c MFr Bld: 6.6 % — ABNORMAL HIGH (ref 4.6–6.5)

## 2013-05-13 MED ORDER — CYANOCOBALAMIN 1000 MCG/ML IJ SOLN: 1000.0000 ug | Freq: Once | INTRAMUSCULAR | Status: DC

## 2013-05-13 NOTE — Assessment & Plan Note (Addendum)
On diet control, check a A1c

## 2013-05-13 NOTE — Assessment & Plan Note (Signed)
Td 2007 Pneumonia shot 2007 shingles shot  2008 Flu shot today sees gyn for female care  MMG 10-14 neg [id number] (-) and 7-10 normal ---> cont w/ Calcium, vit d  Last Cscope 03-2011 Dr. Russella Dar , had diverticuli and diverticulitis, next per GI  has a healthy life style

## 2013-05-13 NOTE — Progress Notes (Signed)
Subjective:    Patient ID: Sierra Klein, female    DOB: Nov 11, 1931, 79 y.o.   MRN: [id number]  HPI  Here for Medicare AWV: 1. Risk factors based on Past M, S, F history: reviewed   2. Physical Activities:  house chores, some yard work , less active than before (DJD , neuropathy) 3. Depression/mood: some anxiety and admits she "worries " a lot , no depression-- sx are at baseline 4. Hearing: mild problems, none noted today at the visit , this year again declined to see a hearing specialist 5. ADL's: independent, still drives    6. Fall Risk: had a  Mechanical fall 10-2012, treated else where, no Fx, prevention discussed  , declined PT referral 7. Home Safety:  does feel safe at home   8. Height, weight, &visual acuity: see Vs, vision corrected w/ glasses, saw eye doctor , had laser treatment for cataracts    9. Counseling: yes   10. Labs ordered based on risk factors: yes   11.Referral Coordination, if needed   12.Care Plan, see a/p 13.Cognitive Assessment:  motor skills decreased, see above,cognition and mood seem appropriate  in addition we discussed the following issues Diabetes--ambulatory BPs are in the morning 150s, the rest of the day are lower than 150 B12 deficiency--good compliance with shots Sleep disorder, on Ambien, no complaints today Neuropathy, DJD--taking gabapentin, Cymbalta and  hydrocodone.  Past Medical History  Diagnosis Date  . Disturbance, sleep     narcolepsy  . Diabetes mellitus   . B12 deficiency anemia   . 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 Osteoarthritis   . Hyperlipidemia     started med 6/09  . Diverticulosis   . Colon polyps   . Diverticulitis   . Hiatal hernia   . Diverticulosis   . Cholelithiasis    Past Surgical History  Procedure Laterality Date  . Abdominal hysterectomy      no oophorectomy  . Cataract extraction      l- 9/09, r- 3/10  . Colonoscopy  2002  .  Belpharoptosis repair    . Breast biopsy  09/1983    left  . Foot surgery  03/1988    cyst removed from right heel  . Pelvic surgery  2003    cysto-recto-enetero cele repair   . Laparoscopic lysis of adhesions  2013     RLQ pain resolved after surgery   Family History  Problem Relation Age of Onset  . Coronary artery disease Sister   . Coronary artery disease Mother     tachycardia  . Coronary artery disease Brother     x 2 brothers  . Hypertension Brother     x 2 brothers  . Hyperlipidemia Brother   . Alzheimer's disease Mother   . Diabetes      numerous cousins  . Colon cancer Neg Hx   . Breast cancer Neg Hx     Social History:   Married x 60 years, children x 2   Quit tobacco-- years ago   Alcohol use-yes wine 3-4x/wk   Drug use-no      Review of Systems No  CP, SOB, lower extremity edema Denies  nausea, vomiting;  Diarrhea controlled on lomotil Denies  blood in the stools (-) cough, sputum production No dysuria, gross hematuria, difficulty urinating        Objective:   Physical Exam BP 129/77  Pulse 75  Temp(Src)  98.1 F (36.7 C)  Ht 5' 5.2" (1.656 m)  Wt 151 lb (68.493 kg)  BMI 24.98 kg/m2  SpO2 95% General -- alert, well-developed, NAD.  Neck --no thyromegaly  Lungs -- normal respiratory effort, no intercostal retractions, no accessory muscle use, and normal breath sounds.  Heart-- normal rate, regular rhythm, no murmur.  Abdomen-- Not distended, good bowel sounds,soft, non-tender. No rebound or rigidity. No mass,organomegaly.  Extremities-- no pretibial edema bilaterally  Neurologic--  alert & oriented X3. Speech normal, gait ok for age, slt more frail than in previous years, strength normal in all extremities.  Psych-- Cognition and judgment appear intact. Cooperative with normal attention span and concentration. No anxious appearing , no depressed appearing.     Assessment & Plan:

## 2013-05-13 NOTE — Assessment & Plan Note (Signed)
Labs

## 2013-05-13 NOTE — Progress Notes (Signed)
Pre visit review using our clinic review tool, if applicable. No additional management support is needed unless otherwise documented below in the visit note. 

## 2013-05-13 NOTE — Assessment & Plan Note (Signed)
Symptoms preventing her from being  more active, followup by neurology

## 2013-05-13 NOTE — Patient Instructions (Signed)
Get your blood work before you leave (also B12 - folic acid) Next visit in 6 months for a  follow up (30 minutes) No Fasting Please make an appointment    Fall Prevention and Home Safety Falls cause injuries and can affect all age groups. It is possible to use preventive measures to significantly decrease the likelihood of falls. There are many simple measures which can make your home safer and prevent falls. OUTDOORS  Repair cracks and edges of walkways and driveways.  Remove high doorway thresholds.  Trim shrubbery on the main path into your home.  Have good outside lighting.  Clear walkways of tools, rocks, debris, and clutter.  Check that handrails are not broken and are securely fastened. Both sides of steps should have handrails.  Have leaves, snow, and ice cleared regularly.  Use sand or salt on walkways during winter months.  In the garage, clean up grease or oil spills. BATHROOM  Install night lights.  Install grab bars by the toilet and in the tub and shower.  Use non-skid mats or decals in the tub or shower.  Place a plastic non-slip stool in the shower to sit on, if needed.  Keep floors dry and clean up all water on the floor immediately.  Remove soap buildup in the tub or shower on a regular basis.  Secure bath mats with non-slip, double-sided rug tape.  Remove throw rugs and tripping hazards from the floors. BEDROOMS  Install night lights.  Make sure a bedside light is easy to reach.  Do not use oversized bedding.  Keep a telephone by your bedside.  Have a firm chair with side arms to use for getting dressed.  Remove throw rugs and tripping hazards from the floor. KITCHEN  Keep handles on pots and pans turned toward the center of the stove. Use back burners when possible.  Clean up spills quickly and allow time for drying.  Avoid walking on wet floors.  Avoid hot utensils and knives.  Position shelves so they are not too high or  low.  Place commonly used objects within easy reach.  If necessary, use a sturdy step stool with a grab bar when reaching.  Keep electrical cables out of the way.  Do not use floor polish or wax that makes floors slippery. If you must use wax, use non-skid floor wax.  Remove throw rugs and tripping hazards from the floor. STAIRWAYS  Never leave objects on stairs.  Place handrails on both sides of stairways and use them. Fix any loose handrails. Make sure handrails on both sides of the stairways are as long as the stairs.  Check carpeting to make sure it is firmly attached along stairs. Make repairs to worn or loose carpet promptly.  Avoid placing throw rugs at the top or bottom of stairways, or properly secure the rug with carpet tape to prevent slippage. Get rid of throw rugs, if possible.  Have an electrician put in a light switch at the top and bottom of the stairs. OTHER FALL PREVENTION TIPS  Wear low-heel or rubber-soled shoes that are supportive and fit well. Wear closed toe shoes.  When using a stepladder, make sure it is fully opened and both spreaders are firmly locked. Do not climb a closed stepladder.  Add color or contrast paint or tape to grab bars and handrails in your home. Place contrasting color strips on first and last steps.  Learn and use mobility aids as needed. Install an electrical emergency response system.  Turn on lights to avoid dark areas. Replace light bulbs that burn out immediately. Get light switches that glow.  Arrange furniture to create clear pathways. Keep furniture in the same place.  Firmly attach carpet with non-skid or double-sided tape.  Eliminate uneven floor surfaces.  Select a carpet pattern that does not visually hide the edge of steps.  Be aware of all pets. OTHER HOME SAFETY TIPS  Set the water temperature for 120 F (48.8 C).  Keep emergency numbers on or near the telephone.  Keep smoke detectors on every level of the  home and near sleeping areas. Document Released: 06/02/2002 Document Revised: 12/12/2011 Document Reviewed: 09/01/2011 [facility] Patient Information 2014 [place], [place].

## 2013-05-13 NOTE — Assessment & Plan Note (Signed)
Good compliance with shots, labs

## 2013-05-13 NOTE — Assessment & Plan Note (Addendum)
Patient was unaware of this condition, symptoms of acute cholecystitis discussed, will call if she ever has symptoms

## 2013-05-13 NOTE — Assessment & Plan Note (Signed)
On [place]

## 2013-05-15 ENCOUNTER — Encounter: Payer: Self-pay | Admitting: Internal Medicine

## 2013-06-12 ENCOUNTER — Ambulatory Visit (INDEPENDENT_AMBULATORY_CARE_PROVIDER_SITE_OTHER): Payer: Medicare Other | Admitting: *Deleted

## 2013-06-12 DIAGNOSIS — E538 Deficiency of other specified B group vitamins: Secondary | ICD-10-CM | POA: Diagnosis not present

## 2013-06-12 MED ORDER — CYANOCOBALAMIN 1000 MCG/ML IJ SOLN
1000.0000 ug | Freq: Once | INTRAMUSCULAR | Status: AC
  Administered 2013-06-12: 1000 ug via INTRAMUSCULAR

## 2013-07-28 ENCOUNTER — Ambulatory Visit: Payer: Medicare Other

## 2013-08-13 ENCOUNTER — Ambulatory Visit: Payer: Medicare Other

## 2013-09-02 DIAGNOSIS — R269 Unspecified abnormalities of gait and mobility: Secondary | ICD-10-CM | POA: Insufficient documentation

## 2013-09-02 DIAGNOSIS — M25549 Pain in joints of unspecified hand: Secondary | ICD-10-CM | POA: Diagnosis not present

## 2013-09-02 DIAGNOSIS — G603 Idiopathic progressive neuropathy: Secondary | ICD-10-CM | POA: Diagnosis not present

## 2013-11-11 ENCOUNTER — Ambulatory Visit (INDEPENDENT_AMBULATORY_CARE_PROVIDER_SITE_OTHER): Payer: Medicare Other | Admitting: Internal Medicine

## 2013-11-11 ENCOUNTER — Encounter: Payer: Self-pay | Admitting: Internal Medicine

## 2013-11-11 VITALS — BP 124/80 | HR 70 | Temp 98.2°F | Wt 156.0 lb

## 2013-11-11 DIAGNOSIS — R5381 Other malaise: Secondary | ICD-10-CM | POA: Diagnosis not present

## 2013-11-11 DIAGNOSIS — R5383 Other fatigue: Secondary | ICD-10-CM | POA: Diagnosis not present

## 2013-11-11 DIAGNOSIS — E119 Type 2 diabetes mellitus without complications: Secondary | ICD-10-CM | POA: Diagnosis not present

## 2013-11-11 DIAGNOSIS — D518 Other vitamin B12 deficiency anemias: Secondary | ICD-10-CM

## 2013-11-11 LAB — LIPID PANEL
Cholesterol: 190 mg/dL (ref 0–200)
HDL: 40.8 mg/dL (ref >39.00)
LDL Cholesterol: 101 mg/dL — ABNORMAL HIGH (ref 0–99)
Total CHOL/HDL Ratio: 5
Triglycerides: 241 mg/dL — ABNORMAL HIGH (ref 0.0–149.0)
VLDL: 48.2 mg/dL — ABNORMAL HIGH (ref 0.0–40.0)

## 2013-11-11 LAB — TSH: TSH: 1.74 u[IU]/mL (ref 0.35–4.50)

## 2013-11-11 LAB — HEMOGLOBIN A1C: HEMOGLOBIN A1C: 6.5 % (ref 4.6–6.5)

## 2013-11-11 MED ORDER — CYANOCOBALAMIN 1000 MCG/ML IJ SOLN
1000.0000 ug | Freq: Once | INTRAMUSCULAR | Status: AC
  Administered 2013-11-11: 1000 ug via INTRAMUSCULAR

## 2013-11-11 NOTE — Patient Instructions (Signed)
Get your blood work before you leave     Next visit is for routine check up   in 3 months  No need to come back fasting Please make an appointment    

## 2013-11-11 NOTE — Assessment & Plan Note (Addendum)
C/o lack of energy as described in the HPI. Also, her sleep  scheduled is reverting, sleeping more during the day. Recent labs normal, will check a TSH. I think a major factor is her frustration about not being able to walk and be active d/t neuropathy. I counseled her today and she will think about see a prefesional counselor.   Also she snores, previously had a negative for sleep apnea this years ago. Consider recheck a sleep study

## 2013-11-11 NOTE — Assessment & Plan Note (Signed)
Due for a A1c. Cholesterol was  slightly elevated, will check an FLP. Given neuropathy and pain, we'll have to be very cautious about starting a statin

## 2013-11-11 NOTE — Assessment & Plan Note (Signed)
Shot today 

## 2013-11-11 NOTE — Progress Notes (Signed)
Subjective:    Patient ID: Sierra Klein, female    DOB: 24-Mar-1932, 80 y.o.   MRN: [id number]  DOS:  11/11/2013 Type of  visit: Routine visit Slightly elevated cholesterol, trying to eat healthier. Unable to exercise B12 deficiency, due for a shot Diabetes, due for A1c Today also she complains about lack of energy, sleeping more throughout the day and unable to sleep at night. She simply gets exhausted after activities such as grocery shopping. On further questioning, she admits to feeling slightly depressed due to to her general  Health, very limited by neuropathy.    ROS denies chest pain, difficulty breathing, no extremity edema or dyspnea on exertion. Denies feeling thirsty or weight loss. No suicidal ideas. Does have a history of snoring and she still snores every night   Past Medical History  Diagnosis Date  . Disturbance, sleep     narcolepsy  . Diabetes mellitus   . B12 deficiency anemia   . 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 Osteoarthritis   . Hyperlipidemia     started med 6/09  . Diverticulosis   . Colon polyps   . Diverticulitis   . Hiatal hernia   . Diverticulosis   . Cholelithiasis     Past Surgical History  Procedure Laterality Date  . Abdominal hysterectomy      no oophorectomy  . Cataract extraction      l- 9/09, r- 3/10  . Colonoscopy  2002  . Belpharoptosis repair    . Breast biopsy  09/1983    left  . Foot surgery  03/1988    cyst removed from right heel  . Pelvic surgery  2003    cysto-recto-enetero cele repair   . Laparoscopic lysis of adhesions  2013     RLQ pain resolved after surgery    History   Social History  . Marital Status: Married    Spouse Name: N/A    Number of Children: 2  . Years of Education: N/A   Occupational History  . retired    Social History Main Topics  . Smoking status: Former Smoker    Types: Cigarettes    Quit date: 06/28/1954    . Smokeless tobacco: Never Used  . Alcohol Use: 2.4 oz/week    4 Glasses of wine per week     Comment: 3-4x/week  . Drug Use: No  . Sexual Activity: Not on file   Other Topics Concern  . Not on file   Social History Narrative   Married x 60 years   still drives            Medication List       This list is accurate as of: 11/11/13  6:47 PM.  Always use your most recent med list.               AMBIEN 10 MG tablet  Generic drug:  zolpidem  Take 5 mg by mouth at bedtime as needed.     aspirin 325 MG tablet  Take 325 mg by mouth daily.     calcium carbonate 1250 MG capsule  Take 1,250 mg by mouth 2 (two) times daily with a meal.     cyanocobalamin 1000 MCG tablet  Take 100 mcg by mouth daily.     VITAMIN B-12 IJ  Inject 1 mL as directed. Takes monthly     cyanocobalamin 1000 MCG/ML injection  Commonly  known as:  (VITAMIN B-12)  Inject 1 mL (1,000 mcg total) into the muscle once.     diclofenac sodium 1 % Gel  Commonly known as:  VOLTAREN  Apply 2 g topically as needed.     diphenoxylate-atropine 2.5-0.025 MG per tablet  Commonly known as:  LOMOTIL  TAKE ONE TABLET BY MOUTH ONCE DAILY AS NEEDED FOR DIARRHEA OR LOOSE STOOLS     DULoxetine 60 MG capsule  Commonly known as:  CYMBALTA  Take 60 mg by mouth daily.     gabapentin 600 MG tablet  Commonly known as:  NEURONTIN  Take 600 mg by mouth 4 (four) times daily.     hydrocodone-acetaminophen 5-500 MG per capsule  Commonly known as:  LORCET-HD  Take 1 capsule by mouth at bedtime as needed.     multivitamin per tablet  Take 1 tablet by mouth daily.     VITAMIN D-3 PO  Take 1 capsule by mouth daily.           Objective:   Physical Exam BP 124/80  Pulse 70  Temp(Src) 98.2 F (36.8 C) (Oral)  Wt 156 lb (70.761 kg)  SpO2 91% General -- alert, well-developed, NAD.   HEENT-- Not pale.  Lungs -- normal respiratory effort, no intercostal retractions, no accessory muscle use, and normal breath  sounds.  Heart-- normal rate, regular rhythm, no murmur.  Extremities-- no pretibial edema bilaterally  Neurologic--  alert & oriented X3. Speech normal, gait normal, strength normal in all extremities.    Psych-- Cognition and judgment appear intact. Cooperative with normal attention span and concentration. No anxious or depressed appearing.        Assessment & Plan:   Today , I spent more than 25 min with the patient, >50% of the time counseling

## 2013-11-11 NOTE — Progress Notes (Signed)
Pre-visit discussion using our clinic review tool. No additional management support is needed unless otherwise documented below in the visit note.  

## 2013-11-12 ENCOUNTER — Encounter: Payer: Self-pay | Admitting: *Deleted

## 2013-11-12 NOTE — Progress Notes (Signed)
Letter sent with lab results.  

## 2013-12-12 ENCOUNTER — Ambulatory Visit (INDEPENDENT_AMBULATORY_CARE_PROVIDER_SITE_OTHER): Payer: Medicare Other | Admitting: *Deleted

## 2013-12-12 DIAGNOSIS — E538 Deficiency of other specified B group vitamins: Secondary | ICD-10-CM | POA: Diagnosis not present

## 2013-12-12 MED ORDER — CYANOCOBALAMIN 1000 MCG/ML IJ SOLN
1000.0000 ug | Freq: Once | INTRAMUSCULAR | Status: AC
  Administered 2013-12-12: 1000 ug via INTRAMUSCULAR

## 2013-12-15 DIAGNOSIS — Z961 Presence of intraocular lens: Secondary | ICD-10-CM | POA: Diagnosis not present

## 2013-12-15 DIAGNOSIS — H43819 Vitreous degeneration, unspecified eye: Secondary | ICD-10-CM | POA: Diagnosis not present

## 2013-12-15 DIAGNOSIS — E119 Type 2 diabetes mellitus without complications: Secondary | ICD-10-CM | POA: Diagnosis not present

## 2013-12-15 DIAGNOSIS — H02839 Dermatochalasis of unspecified eye, unspecified eyelid: Secondary | ICD-10-CM | POA: Diagnosis not present

## 2013-12-15 LAB — HM DIABETES EYE EXAM

## 2014-01-13 ENCOUNTER — Ambulatory Visit (INDEPENDENT_AMBULATORY_CARE_PROVIDER_SITE_OTHER): Payer: Medicare Other

## 2014-01-13 DIAGNOSIS — E538 Deficiency of other specified B group vitamins: Secondary | ICD-10-CM | POA: Diagnosis not present

## 2014-01-13 MED ORDER — CYANOCOBALAMIN 1000 MCG/ML IJ SOLN
1000.0000 ug | Freq: Once | INTRAMUSCULAR | Status: AC
  Administered 2014-01-13: 1000 ug via INTRAMUSCULAR

## 2014-02-11 ENCOUNTER — Ambulatory Visit (INDEPENDENT_AMBULATORY_CARE_PROVIDER_SITE_OTHER): Payer: Medicare Other | Admitting: Internal Medicine

## 2014-02-11 ENCOUNTER — Encounter: Payer: Self-pay | Admitting: Internal Medicine

## 2014-02-11 VITALS — BP 109/64 | HR 64 | Temp 98.1°F | Wt 153.4 lb

## 2014-02-11 DIAGNOSIS — R609 Edema, unspecified: Secondary | ICD-10-CM | POA: Diagnosis not present

## 2014-02-11 DIAGNOSIS — R5383 Other fatigue: Principal | ICD-10-CM

## 2014-02-11 DIAGNOSIS — Z23 Encounter for immunization: Secondary | ICD-10-CM | POA: Diagnosis not present

## 2014-02-11 DIAGNOSIS — R5381 Other malaise: Secondary | ICD-10-CM

## 2014-02-11 DIAGNOSIS — G589 Mononeuropathy, unspecified: Secondary | ICD-10-CM

## 2014-02-11 NOTE — Assessment & Plan Note (Addendum)
Symptoms are about the same. Dr. Jannifer [facility] neurology(located in  [place])  , follow her every 6 months.  Reports that   tried Lyrica before but it caused swelling.

## 2014-02-11 NOTE — Progress Notes (Signed)
Subjective:    Patient ID: Sierra Klein, female    DOB: October 08, 1931, 80 y.o.   MRN: [id number]  DOS:  02/11/2014 Type of visit - description: f/u previous visit History: --Since the last time she was here, she had labs, and they were satisfactory. --Fatigue, she said is feeling  about the same. --She's concerned about spider varicose veins in her legs, and occasional lower extremity edema at the end of the day. Medication list reviewed, good compliance. Labs reviewed, not due for any tests today  ROS Denies chest pain or difficulty breathing No nausea vomiting, occasional diarrhea. No anxiety or depression at this time.   Past Medical History  Diagnosis Date  . Disturbance, sleep     narcolepsy  . Diabetes mellitus   . B12 deficiency anemia   . 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 Osteoarthritis   . Hyperlipidemia     started med 6/09  . Diverticulosis   . Colon polyps   . Diverticulitis   . Hiatal hernia   . Diverticulosis   . Cholelithiasis     Past Surgical History  Procedure Laterality Date  . Abdominal hysterectomy      no oophorectomy  . Cataract extraction      l- 9/09, r- 3/10  . Colonoscopy  2002  . Belpharoptosis repair    . Breast biopsy  09/1983    left  . Foot surgery  03/1988    cyst removed from right heel  . Pelvic surgery  2003    cysto-recto-enetero cele repair   . Laparoscopic lysis of adhesions  2013     RLQ pain resolved after surgery    History   Social History  . Marital Status: Married    Spouse Name: N/A    Number of Children: 2  . Years of Education: N/A   Occupational History  . retired    Social History Main Topics  . Smoking status: Former Smoker    Types: Cigarettes    Quit date: 06/28/1954  . Smokeless tobacco: Never Used  . Alcohol Use: 2.4 oz/week    4 Glasses of wine per week     Comment: 3-4x/week  . Drug Use: No  . Sexual Activity: Not  on file   Other Topics Concern  . Not on file   Social History Narrative   Married x 60 years   still drives            Medication List       This list is accurate as of: 02/11/14 10:50 AM.  Always use your most recent med list.               AMBIEN 10 MG tablet  Generic drug:  zolpidem  Take 5 mg by mouth at bedtime as needed.     aspirin 325 MG tablet  Take 325 mg by mouth daily.     calcium carbonate 1250 MG capsule  Take 1,250 mg by mouth 2 (two) times daily with a meal.     cyanocobalamin 1000 MCG tablet  Take 100 mcg by mouth daily.     VITAMIN B-12 IJ  Inject 1 mL as directed. Takes monthly     cyanocobalamin 1000 MCG/ML injection  Commonly known as:  (VITAMIN B-12)  Inject 1 mL (1,000 mcg total) into the muscle once.     diclofenac sodium 1 % Gel  Commonly known as:  VOLTAREN  Apply 2 g topically as needed.     diphenoxylate-atropine 2.5-0.025 MG per tablet  Commonly known as:  LOMOTIL  TAKE ONE TABLET BY MOUTH ONCE DAILY AS NEEDED FOR DIARRHEA OR LOOSE STOOLS     DULoxetine 60 MG capsule  Commonly known as:  CYMBALTA  Take 60 mg by mouth daily.     gabapentin 600 MG tablet  Commonly known as:  NEURONTIN  Take 600 mg by mouth 4 (four) times daily.     multivitamin per tablet  Take 1 tablet by mouth daily.     NORCO 5-325 MG per tablet  Generic drug:  HYDROcodone-acetaminophen  Take by mouth.     VITAMIN D-3 PO  Take 1 capsule by mouth daily.           Objective:   Physical Exam BP 109/64  Pulse 64  Temp(Src) 98.1 F (36.7 C) (Oral)  Wt 153 lb 6.4 oz (69.582 kg)  SpO2 97%  General -- alert, well-developed, NAD.  Lungs -- normal respiratory effort, no intercostal retractions, no accessory muscle use, and normal breath sounds.  Heart-- normal rate, regular rhythm, no murmur.  Extremities-- trace pretibial edema bilaterally , B LE distal spider varicosities w/o redness or TTP Neurologic--  alert & oriented X3  Psych-- Cognition  and judgment appear intact. Cooperative with normal attention span and concentration. No anxious or depressed appearing.       Assessment & Plan:   Spider veins, mild lower extremity edema. She wonders if using compression stocking is a good idea, I agree it is

## 2014-02-11 NOTE — Assessment & Plan Note (Signed)
Doing about the same, she agrees w/ me that one of the factors affecting her energy level is frustration about not being able to do things due to neuropathy. She's also taking   gabapentin and that make her slightly sleepy. Plan: Observation for now, patient is counseled

## 2014-02-11 NOTE — Progress Notes (Signed)
Pre-visit discussion using our clinic review tool. No additional management support is needed unless otherwise documented below in the visit note.  

## 2014-02-11 NOTE — Patient Instructions (Addendum)
  Next visit is for a physical exam by 04-2014 ,  fasting Please make an appointment       

## 2014-02-17 ENCOUNTER — Ambulatory Visit (INDEPENDENT_AMBULATORY_CARE_PROVIDER_SITE_OTHER): Payer: Medicare Other

## 2014-02-17 DIAGNOSIS — E538 Deficiency of other specified B group vitamins: Secondary | ICD-10-CM | POA: Diagnosis not present

## 2014-02-17 MED ORDER — CYANOCOBALAMIN 1000 MCG/ML IJ SOLN
1000.0000 ug | Freq: Once | INTRAMUSCULAR | Status: AC
  Administered 2014-02-17: 1000 ug via INTRAMUSCULAR

## 2014-03-10 DIAGNOSIS — R5383 Other fatigue: Secondary | ICD-10-CM | POA: Diagnosis not present

## 2014-03-10 DIAGNOSIS — R5381 Other malaise: Secondary | ICD-10-CM | POA: Diagnosis not present

## 2014-03-10 DIAGNOSIS — G4710 Hypersomnia, unspecified: Secondary | ICD-10-CM | POA: Diagnosis not present

## 2014-03-10 DIAGNOSIS — R269 Unspecified abnormalities of gait and mobility: Secondary | ICD-10-CM | POA: Diagnosis not present

## 2014-03-10 DIAGNOSIS — G603 Idiopathic progressive neuropathy: Secondary | ICD-10-CM | POA: Diagnosis not present

## 2014-03-20 ENCOUNTER — Ambulatory Visit: Payer: Medicare Other

## 2014-03-26 ENCOUNTER — Other Ambulatory Visit: Payer: Self-pay | Admitting: Internal Medicine

## 2014-03-27 ENCOUNTER — Ambulatory Visit (INDEPENDENT_AMBULATORY_CARE_PROVIDER_SITE_OTHER): Payer: Medicare Other | Admitting: *Deleted

## 2014-03-27 DIAGNOSIS — Z23 Encounter for immunization: Secondary | ICD-10-CM | POA: Diagnosis not present

## 2014-03-27 DIAGNOSIS — E538 Deficiency of other specified B group vitamins: Secondary | ICD-10-CM

## 2014-03-27 MED ORDER — CYANOCOBALAMIN 1000 MCG/ML IJ SOLN
1000.0000 ug | Freq: Once | INTRAMUSCULAR | Status: AC
  Administered 2014-03-27: 1000 ug via INTRAMUSCULAR

## 2014-04-16 ENCOUNTER — Other Ambulatory Visit: Payer: Self-pay

## 2014-05-19 ENCOUNTER — Ambulatory Visit (INDEPENDENT_AMBULATORY_CARE_PROVIDER_SITE_OTHER): Payer: Medicare Other | Admitting: Internal Medicine

## 2014-05-19 ENCOUNTER — Encounter: Payer: Self-pay | Admitting: Internal Medicine

## 2014-05-19 VITALS — BP 130/76 | HR 67 | Temp 97.8°F | Ht 65.0 in | Wt 154.2 lb

## 2014-05-19 DIAGNOSIS — Z0000 Encounter for general adult medical examination without abnormal findings: Secondary | ICD-10-CM | POA: Diagnosis not present

## 2014-05-19 DIAGNOSIS — E119 Type 2 diabetes mellitus without complications: Secondary | ICD-10-CM

## 2014-05-19 DIAGNOSIS — E785 Hyperlipidemia, unspecified: Secondary | ICD-10-CM

## 2014-05-19 DIAGNOSIS — E538 Deficiency of other specified B group vitamins: Secondary | ICD-10-CM | POA: Diagnosis not present

## 2014-05-19 DIAGNOSIS — G4700 Insomnia, unspecified: Secondary | ICD-10-CM

## 2014-05-19 DIAGNOSIS — G629 Polyneuropathy, unspecified: Secondary | ICD-10-CM

## 2014-05-19 LAB — BASIC METABOLIC PANEL
BUN: 20 mg/dL (ref 6–23)
CHLORIDE: 105 meq/L (ref 96–112)
CO2: 27 meq/L (ref 19–32)
CREATININE: 0.9 mg/dL (ref 0.4–1.2)
Calcium: 9.9 mg/dL (ref 8.4–10.5)
GFR: 66.49 mL/min (ref >60.00)
Glucose, Bld: 141 mg/dL — ABNORMAL HIGH (ref 70–99)
Potassium: 5.1 mEq/L (ref 3.5–5.1)
SODIUM: 142 meq/L (ref 135–145)

## 2014-05-19 LAB — LIPID PANEL
CHOL/HDL RATIO: 5
CHOLESTEROL: 182 mg/dL (ref 0–200)
HDL: 35.8 mg/dL — AB (ref >39.00)
LDL Cholesterol: 107 mg/dL — ABNORMAL HIGH (ref 0–99)
NonHDL: 146.2
TRIGLYCERIDES: 196 mg/dL — AB (ref 0.0–149.0)
VLDL: 39.2 mg/dL (ref 0.0–40.0)

## 2014-05-19 LAB — HEMOGLOBIN A1C: Hgb A1c MFr Bld: 6.8 % — ABNORMAL HIGH (ref 4.6–6.5)

## 2014-05-19 LAB — MICROALBUMIN / CREATININE URINE RATIO
Creatinine,U: 126.6 mg/dL
MICROALB/CREAT RATIO: 0.3 mg/g (ref 0.0–30.0)
Microalb, Ur: 0.4 mg/dL (ref 0.0–1.9)

## 2014-05-19 LAB — CK: CK TOTAL: 37 U/L (ref 7–177)

## 2014-05-19 MED ORDER — CYANOCOBALAMIN 1000 MCG/ML IJ SOLN
1000.0000 ug | Freq: Once | INTRAMUSCULAR | Status: AC
  Administered 2014-05-19: 1000 ug via INTRAMUSCULAR

## 2014-05-19 NOTE — Assessment & Plan Note (Signed)
Insomnia, on Ambien prescribed by [facility]

## 2014-05-19 NOTE — Progress Notes (Signed)
Pre visit review using our clinic review tool, if applicable. No additional management support is needed unless otherwise documented below in the visit note. 

## 2014-05-19 NOTE — Assessment & Plan Note (Signed)
Td 2007  Pneumonia shot 2007 prevnar 01-2014 shingles shot  2008  Flu shot today  sees gyn for female care Dr Nori Riis MMG 10-14 neg  DEXA12-07 (-) and 7-10 normal ---> cont w/ Calcium, vit d  Last Cscope 03-2011 Dr. Fuller Plan , had diverticuli and diverticulitis, next per GI  has a healthy life style  End of care life discussed

## 2014-05-19 NOTE — Progress Notes (Signed)
Subjective:    Patient ID: Sierra Klein, female    DOB: 02/21/1932, 80 y.o.   MRN: [id number]  DOS:  05/19/2014 Type of visit - description :    Here for Medicare AWV: 1. Risk factors based on Past M, S, F history: reviewed   2. Physical Activities:    still does house-yard  work , less active than before (DJD , neuropathy) 3. Depression/mood: some anxiety and admits she "worries " a lot , no depression-- sx are at baseline, denies need for meds  4. Hearing: mild problems, at baseline 5. ADL's: independent, still drives    6. Fall Risk: no recent falls, prevention discussed  7. Home Safety:  does feel safe at home   8. Height, weight, &visual acuity: see Vs, vision corrected w/ glasses, sees eye doctor regulalrly 9. Counseling: yes   10. Labs ordered based on risk factors: yes   11.Referral Coordination, if needed   12.Care Plan, see a/p 13.Cognitive Assessment:  motor skills decreased, see above,cognition and mood seem appropriate 14. Care team updated 15. Written personalized care provided   in addition we discussed the following issues Bilateral lower extremity edema at the end of the day and also   some superficial varicose veins. Neuropathy, symptoms relatively well controlled B12 deficiency, on IM supplements Insomnia, on Ambien, symptoms well controlled.   ROS Denies chest pain or difficulty breathing No nausea, vomiting, abdominal pain or blood in the stools. Occasional diarrhea  Past Medical History  Diagnosis Date  . Disturbance, sleep     narcolepsy  . Diabetes mellitus   . B12 deficiency anemia   . 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 Osteoarthritis   . Hyperlipidemia     started med 6/09  . Diverticulosis   . Colon polyps   . Diverticulitis   . Hiatal hernia   . Diverticulosis   . Cholelithiasis     Past Surgical History  Procedure Laterality Date  . Abdominal hysterectomy        no oophorectomy  . Cataract extraction      l- 9/09, r- 3/10  . Colonoscopy  2002  . Belpharoptosis repair    . Breast biopsy  09/1983    left  . Foot surgery  03/1988    cyst removed from right heel  . Pelvic surgery  2003    cysto-recto-enetero cele repair   . Laparoscopic lysis of adhesions  2013     RLQ pain resolved after surgery    History   Social History  . Marital Status: Married    Spouse Name: N/A    Number of Children: 2  . Years of Education: N/A   Occupational History  . retired    Social History Main Topics  . Smoking status: Former Smoker    Types: Cigarettes    Quit date: 06/28/1954  . Smokeless tobacco: Never Used  . Alcohol Use: 2.4 oz/week    4 Glasses of wine per week     Comment: 3-4x/week  . Drug Use: No  . Sexual Activity: Not on file   Other Topics Concern  . Not on file   Social History Narrative   Married x 36 years   still drives   Children live @  [place] [facility] [place]         Family History  Problem Relation Age of Onset  . Coronary artery disease Sister   .  Coronary artery disease Mother     tachycardia  . Coronary artery disease Brother     x 2 brothers  . Hypertension Brother     x 2 brothers  . Hyperlipidemia Brother   . Alzheimer's disease Mother   . Diabetes      numerous cousins  . Colon cancer Neg Hx   . Breast cancer Neg Hx        Medication List       This list is accurate as of: 05/19/14  7:10 PM.  Always use your most recent med list.               AMBIEN 10 MG tablet  Generic drug:  zolpidem  Take 5 mg by mouth at bedtime as needed.     aspirin 325 MG tablet  Take 325 mg by mouth daily.     calcium carbonate 1250 MG capsule  Take 1,250 mg by mouth 2 (two) times daily with a meal.     cyanocobalamin 1000 MCG tablet  Take 100 mcg by mouth daily.     cyanocobalamin 1000 MCG/ML injection  Commonly known as:  (VITAMIN B-12)  Inject 1 mL (1,000 mcg total) into the muscle once.      diclofenac sodium 1 % Gel  Commonly known as:  VOLTAREN  Apply 2 g topically as needed.     diphenoxylate-atropine 2.5-0.025 MG per tablet  Commonly known as:  LOMOTIL  TAKE ONE TABLET BY MOUTH ONCE DAILY AS NEEDED FOR  DIARRHEA  OR  LOOSE  STOOLS     DULoxetine 60 MG capsule  Commonly known as:  CYMBALTA  Take 60 mg by mouth daily.     gabapentin 600 MG tablet  Commonly known as:  NEURONTIN  Take 600 mg by mouth 4 (four) times daily.     multivitamin per tablet  Take 1 tablet by mouth daily.     NORCO 5-325 MG per tablet  Generic drug:  HYDROcodone-acetaminophen  Take by mouth.     VITAMIN D-3 PO  Take 1 capsule by mouth daily.           Objective:   Physical Exam BP 130/76 mmHg  Pulse 67  Temp(Src) 97.8 F (36.6 C) (Oral)  Ht 5\' 5"  (1.651 m)  Wt 154 lb 4 oz (69.967 kg)  BMI 25.67 kg/m2  SpO2 89%  General -- alert, well-developed, NAD.  Neck --no thyromegaly , normal carotid pulse  HEENT-- Not pale.   Lungs -- normal respiratory effort, no intercostal retractions, no accessory muscle use, and normal breath sounds.  Heart-- normal rate, regular rhythm, no murmur.  Abdomen-- Not distended, good bowel sounds,soft, non-tender. No bruit or  Mass  Extremities-- trace pretibial edema bilaterally ; few superficial spuder varicose veins  Neurologic--  alert & oriented X3. Speech normal, gait appropriate for age, strength symmetric and appropriate for age.  Psych-- Cognition and judgment appear intact. Cooperative with normal attention span and concentration. No anxious or depressed appearing.     Assessment & Plan:

## 2014-05-19 NOTE — Assessment & Plan Note (Signed)
Provide a shot today, last B12 great

## 2014-05-19 NOTE — Assessment & Plan Note (Signed)
Labs

## 2014-05-19 NOTE — Assessment & Plan Note (Signed)
On diet control, check A1c. Last triglycerides are slightly elevated, check FLP.

## 2014-05-19 NOTE — Patient Instructions (Signed)
Get your blood work before you leave    Please come back to the office in 6 months  for a routine check up   Two very good sites to explore: [email or web address]       Preventive Care for Adults   Ages 90 years and over  Blood pressure check.** / Every 1 to 2 years.  Lipid and cholesterol check.** / Every 5 years beginning at age 78 years.  Lung cancer screening. / Every year if you are aged 51-80 years and have a 30-pack-year history of smoking and currently smoke or have quit within the past 15 years. Yearly screening is stopped once you have quit smoking for at least 15 years or develop a health problem that would prevent you from having lung cancer treatment.  Clinical breast exam.** / Every year after age 50 years.  BRCA-related cancer risk assessment.** / For women who have family members with a BRCA-related cancer (breast, ovarian, tubal, or peritoneal cancers).  Mammogram.** / Every year beginning at age 67 years and continuing for as long as you are in good health. Consult with your health care provider.  Pap test.** / Every 3 years starting at age 25 years through age 77 or 20 years with 3 consecutive normal Pap tests. Testing can be stopped between 65 and 70 years with 3 consecutive normal Pap tests and no abnormal Pap or HPV tests in the past 10 years.  HPV screening.** / Every 3 years from ages 64 years through ages 77 or 73 years with a history of 3 consecutive normal Pap tests. Testing can be stopped between 65 and 70 years with 3 consecutive normal Pap tests and no abnormal Pap or HPV tests in the past 10 years.  Fecal occult blood test (FOBT) of stool. / Every year beginning at age 48 years and continuing until age 79 years. You may not need to do this test if you get a colonoscopy every 10 years.  Flexible sigmoidoscopy or colonoscopy.** / Every 5 years for a flexible sigmoidoscopy or every 10 years for a colonoscopy beginning  at age 78 years and continuing until age 35 years.  Hepatitis C blood test.** / For all people born from [birth year] through 1963 and any individual with known risks for hepatitis C.  Osteoporosis screening.** / A one-time screening for women ages 4 years and over and women at risk for fractures or osteoporosis.  Skin self-exam. / Monthly.  Influenza vaccine. / Every year.  Tetanus, diphtheria, and acellular pertussis (Tdap/Td) vaccine.** / 1 dose of Td every 10 years.  Varicella vaccine.** / Consult your health care provider.  Zoster vaccine.** / 1 dose for adults aged 59 years or older.  Pneumococcal 13-valent conjugate (PCV13) vaccine.** / Consult your health care provider.  Pneumococcal polysaccharide (PPSV23) vaccine.** / 1 dose for all adults aged 29 years and older.  Meningococcal vaccine.** / Consult your health care provider.  Hepatitis A vaccine.** / Consult your health care provider.  Hepatitis B vaccine.** / Consult your health care provider.  Haemophilus influenzae type b (Hib) vaccine.** / Consult your health care provider. ** Family history and personal history of risk and conditions may change your health care provider's recommendations. Document Released: 08/08/2001 Document Revised: 10/27/2013 Document Reviewed: 11/07/2010 [facility] Patient Information 2015 [place], [place]. This information is not intended to replace advice given to you by your health care provider. Make sure you discuss any questions you have with your health care provider.     Fall Prevention  and Home Safety Falls cause injuries and can affect all age groups. It is possible to use preventive measures to significantly decrease the likelihood of falls. There are many simple measures which can make your home safer and prevent falls. OUTDOORS  Repair cracks and edges of walkways and driveways.  Remove high doorway thresholds.  Trim shrubbery on the main path into your home.  Have good outside  lighting.  Clear walkways of tools, rocks, debris, and clutter.  Check that handrails are not broken and are securely fastened. Both sides of steps should have handrails.  Have leaves, snow, and ice cleared regularly.  Use sand or salt on walkways during winter months.  In the garage, clean up grease or oil spills. BATHROOM  Install night lights.  Install grab bars by the toilet and in the tub and shower.  Use non-skid mats or decals in the tub or shower.  Place a plastic non-slip stool in the shower to sit on, if needed.  Keep floors dry and clean up all water on the floor immediately.  Remove soap buildup in the tub or shower on a regular basis.  Secure bath mats with non-slip, double-sided rug tape.  Remove throw rugs and tripping hazards from the floors. BEDROOMS  Install night lights.  Make sure a bedside light is easy to reach.  Do not use oversized bedding.  Keep a telephone by your bedside.  Have a firm chair with side arms to use for getting dressed.  Remove throw rugs and tripping hazards from the floor. KITCHEN  Keep handles on pots and pans turned toward the center of the stove. Use back burners when possible.  Clean up spills quickly and allow time for drying.  Avoid walking on wet floors.  Avoid hot utensils and knives.  Position shelves so they are not too high or low.  Place commonly used objects within easy reach.  If necessary, use a sturdy step stool with a grab bar when reaching.  Keep electrical cables out of the way.  Do not use floor polish or wax that makes floors slippery. If you must use wax, use non-skid floor wax.  Remove throw rugs and tripping hazards from the floor. STAIRWAYS  Never leave objects on stairs.  Place handrails on both sides of stairways and use them. Fix any loose handrails. Make sure handrails on both sides of the stairways are as long as the stairs.  Check carpeting to make sure it is firmly attached  along stairs. Make repairs to worn or loose carpet promptly.  Avoid placing throw rugs at the top or bottom of stairways, or properly secure the rug with carpet tape to prevent slippage. Get rid of throw rugs, if possible.  Have an electrician put in a light switch at the top and bottom of the stairs. OTHER FALL PREVENTION TIPS  Wear low-heel or rubber-soled shoes that are supportive and fit well. Wear closed toe shoes.  When using a stepladder, make sure it is fully opened and both spreaders are firmly locked. Do not climb a closed stepladder.  Add color or contrast paint or tape to grab bars and handrails in your home. Place contrasting color strips on first and last steps.  Learn and use mobility aids as needed. Install an electrical emergency response system.  Turn on lights to avoid dark areas. Replace light bulbs that burn out immediately. Get light switches that glow.  Arrange furniture to create clear pathways. Keep furniture in the same place.  Firmly attach carpet with non-skid or double-sided tape.  Eliminate uneven floor surfaces.  Select a carpet pattern that does not visually hide the edge of steps.  Be aware of all pets. OTHER HOME SAFETY TIPS  Set the water temperature for 120 F (48.8 C).  Keep emergency numbers on or near the telephone.  Keep smoke detectors on every level of the home and near sleeping areas. Document Released: 06/02/2002 Document Revised: 12/12/2011 Document Reviewed: 09/01/2011 [facility] Patient Information 2015 [place], [place]. This information is not intended to replace advice given to you by your health care provider. Make sure you discuss any questions you have with your health care provider.

## 2014-05-19 NOTE — Assessment & Plan Note (Signed)
Follow-up Dr. Chales Salmon, she requests a CK which will be drawn and faxed to her. Continue with hydrocodone, Neurontin and Cymbalta as prescribed by Dr. Jannifer Franklin

## 2014-06-17 ENCOUNTER — Ambulatory Visit (INDEPENDENT_AMBULATORY_CARE_PROVIDER_SITE_OTHER): Payer: Medicare Other

## 2014-06-17 DIAGNOSIS — E538 Deficiency of other specified B group vitamins: Secondary | ICD-10-CM | POA: Diagnosis not present

## 2014-06-17 MED ORDER — CYANOCOBALAMIN 1000 MCG/ML IJ SOLN
1000.0000 ug | Freq: Once | INTRAMUSCULAR | Status: AC
  Administered 2014-06-17: 1000 ug via INTRAMUSCULAR

## 2014-06-17 NOTE — Progress Notes (Signed)
Pt tolerated injection well

## 2014-06-17 NOTE — Progress Notes (Signed)
Pre visit review using our clinic review tool, if applicable. No additional management support is needed unless otherwise documented below in the visit note. 

## 2014-07-21 ENCOUNTER — Ambulatory Visit (INDEPENDENT_AMBULATORY_CARE_PROVIDER_SITE_OTHER): Payer: Medicare Other

## 2014-07-21 DIAGNOSIS — E538 Deficiency of other specified B group vitamins: Secondary | ICD-10-CM

## 2014-07-21 MED ORDER — CYANOCOBALAMIN 1000 MCG/ML IJ SOLN
1000.0000 ug | Freq: Once | INTRAMUSCULAR | Status: AC
  Administered 2014-07-21: 1000 ug via INTRAMUSCULAR

## 2014-07-21 NOTE — Progress Notes (Signed)
Pre visit review using our clinic review tool, if applicable. No additional management support is needed unless otherwise documented below in the visit note. Patient tolerated injection well.    Patient did not want to schedule appointment today because she will be out of town.

## 2014-09-03 ENCOUNTER — Telehealth: Payer: Self-pay | Admitting: Internal Medicine

## 2014-09-03 MED ORDER — DIPHENOXYLATE-ATROPINE 2.5-0.025 MG PO TABS: 1.0000 | ORAL_TABLET | Freq: Three times a day (TID) | ORAL | Status: DC | PRN

## 2014-09-03 NOTE — Telephone Encounter (Signed)
Yes, may use up to 3 times daily if needed and tolerated

## 2014-09-03 NOTE — Telephone Encounter (Signed)
Pt states that she was given Lomotil by Dr. Henrene Pastor to take once daily. Pt states this helped for a while but for the past couple of weeks she has been having increased number of diarrhea stools. Pt wants to know if she can take the Lomotil more than once daily. Please advise.

## 2014-09-03 NOTE — Telephone Encounter (Signed)
Spoke with pt and she is aware, new script sent to pharmacy. 

## 2014-09-08 DIAGNOSIS — R269 Unspecified abnormalities of gait and mobility: Secondary | ICD-10-CM | POA: Diagnosis not present

## 2014-09-08 DIAGNOSIS — G4710 Hypersomnia, unspecified: Secondary | ICD-10-CM | POA: Diagnosis not present

## 2014-09-08 DIAGNOSIS — G603 Idiopathic progressive neuropathy: Secondary | ICD-10-CM | POA: Diagnosis not present

## 2014-09-08 DIAGNOSIS — F419 Anxiety disorder, unspecified: Secondary | ICD-10-CM | POA: Insufficient documentation

## 2014-09-08 DIAGNOSIS — R531 Weakness: Secondary | ICD-10-CM | POA: Diagnosis not present

## 2014-09-30 ENCOUNTER — Other Ambulatory Visit: Payer: Self-pay

## 2014-11-18 ENCOUNTER — Ambulatory Visit (INDEPENDENT_AMBULATORY_CARE_PROVIDER_SITE_OTHER): Payer: Medicare Other | Admitting: Internal Medicine

## 2014-11-18 ENCOUNTER — Encounter: Payer: Self-pay | Admitting: Internal Medicine

## 2014-11-18 VITALS — BP 130/66 | HR 67 | Temp 97.7°F | Ht 65.0 in | Wt 151.0 lb

## 2014-11-18 DIAGNOSIS — D518 Other vitamin B12 deficiency anemias: Secondary | ICD-10-CM

## 2014-11-18 DIAGNOSIS — E119 Type 2 diabetes mellitus without complications: Secondary | ICD-10-CM

## 2014-11-18 LAB — CBC WITH DIFFERENTIAL/PLATELET
BASOS ABS: 0 10*3/uL (ref 0.0–0.1)
Basophils Relative: 0.2 % (ref 0.0–3.0)
EOS ABS: 0.1 10*3/uL (ref 0.0–0.7)
Eosinophils Relative: 2.1 % (ref 0.0–5.0)
HCT: 42.3 % (ref 36.0–46.0)
Hemoglobin: 13.8 g/dL (ref 12.0–15.0)
LYMPHS ABS: 1.4 10*3/uL (ref 0.7–4.0)
LYMPHS PCT: 22.6 % (ref 12.0–46.0)
MCHC: 32.5 g/dL (ref 30.0–36.0)
MCV: 86.2 fl (ref 78.0–100.0)
Monocytes Absolute: 0.5 10*3/uL (ref 0.1–1.0)
Monocytes Relative: 8.6 % (ref 3.0–12.0)
Neutro Abs: 4 10*3/uL (ref 1.4–7.7)
Neutrophils Relative %: 66.5 % (ref 43.0–77.0)
Platelets: 191 10*3/uL (ref 150.0–400.0)
RBC: 4.91 Mil/uL (ref 3.87–5.11)
RDW: 14.7 % (ref 11.5–15.5)
WBC: 6 10*3/uL (ref 4.0–10.5)

## 2014-11-18 LAB — HEMOGLOBIN A1C: Hgb A1c MFr Bld: 6.1 % (ref 4.6–6.5)

## 2014-11-18 MED ORDER — CYANOCOBALAMIN 1000 MCG/ML IJ SOLN
1000.0000 ug | Freq: Once | INTRAMUSCULAR | Status: AC
  Administered 2014-11-18: 1000 ug via INTRAMUSCULAR

## 2014-11-18 NOTE — Progress Notes (Signed)
Pre visit review using our clinic review tool, if applicable. No additional management support is needed unless otherwise documented below in the visit note. 

## 2014-11-18 NOTE — Patient Instructions (Signed)
Go to the lab.

## 2014-11-18 NOTE — Progress Notes (Signed)
Subjective:    Patient ID: Sierra Klein, female    DOB: 04-16-1932, 81 y.o.   MRN: [id number]  DOS:  11/18/2014 Type of visit - description : rov Interval history: Doing well, no major concerns, from time to time she feels is slightly anxious and "shivering inside", symptoms are sporadic and not severe. No associated chest pain, palpitations, diaphoresis. Good compliance w/ medications, due for an A1c and CBC. Due for a B12 shot.    Review of Systems  Denies chest pain or difficulty breathing No nausea or vomiting. Occasional diarrhea  Past Medical History  Diagnosis Date  . Disturbance, sleep     narcolepsy  . Diabetes mellitus   . B12 deficiency anemia   . 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 Osteoarthritis   . Hyperlipidemia     started med 6/09  . Diverticulosis   . Colon polyps   . Diverticulitis   . Hiatal hernia   . Diverticulosis   . Cholelithiasis     Past Surgical History  Procedure Laterality Date  . Abdominal hysterectomy      no oophorectomy  . Cataract extraction      l- 9/09, r- 3/10  . Colonoscopy  2002  . Belpharoptosis repair    . Breast biopsy  09/1983    left  . Foot surgery  03/1988    cyst removed from right heel  . Pelvic surgery  2003    cysto-recto-enetero cele repair   . Laparoscopic lysis of adhesions  2013     RLQ pain resolved after surgery    History   Social History  . Marital Status: Married    Spouse Name: N/A  . Number of Children: 2  . Years of Education: N/A   Occupational History  . retired    Social History Main Topics  . Smoking status: Former Smoker    Types: Cigarettes    Quit date: 06/28/1954  . Smokeless tobacco: Never Used  . Alcohol Use: 2.4 oz/week    4 Glasses of wine per week     Comment: 3-4x/week  . Drug Use: No  . Sexual Activity: Not on file   Other Topics Concern  . Not on file   Social History Narrative   Married  x 61 years   still drives   Children live @  [place] and [place]            Medication List       This list is accurate as of: 11/18/14 11:59 PM.  Always use your most recent med list.               AMBIEN 10 MG tablet  Generic drug:  zolpidem  Take 5 mg by mouth at bedtime as needed.     aspirin 325 MG tablet  Take 325 mg by mouth daily.     calcium carbonate 1250 MG capsule  Take 1,250 mg by mouth 2 (two) times daily with a meal.     cyanocobalamin 1000 MCG tablet  Take 100 mcg by mouth daily.     cyanocobalamin 1000 MCG/ML injection  Commonly known as:  (VITAMIN B-12)  Inject 1 mL (1,000 mcg total) into the muscle once.     diclofenac sodium 1 % Gel  Commonly known as:  VOLTAREN  Apply 2 g topically as needed.     diphenoxylate-atropine 2.5-0.025 MG per tablet  Commonly known  as:  LOMOTIL  Take 1 tablet by mouth 3 (three) times daily as needed for diarrhea or loose stools.     DULoxetine 60 MG capsule  Commonly known as:  CYMBALTA  Take 60 mg by mouth daily.     gabapentin 600 MG tablet  Commonly known as:  NEURONTIN  Take 600 mg by mouth 4 (four) times daily.     multivitamin per tablet  Take 1 tablet by mouth daily.     NORCO 5-325 MG per tablet  Generic drug:  HYDROcodone-acetaminophen  Take by mouth.     VITAMIN D-3 PO  Take 1 capsule by mouth daily.           Objective:   Physical Exam BP 130/66 mmHg  Pulse 67  Temp(Src) 97.7 F (36.5 C) (Oral)  Ht 5\' 5"  (1.651 m)  Wt 151 lb (68.493 kg)  BMI 25.13 kg/m2  SpO2 98% General:   Well developed, well nourished . NAD.  HEENT:  Normocephalic . Face symmetric, atraumatic Lungs:  CTA B Normal respiratory effort, no intercostal retractions, no accessory muscle use. Heart: RRR,  no murmur.  no pretibial edema bilaterally  Skin: Not pale. Not jaundice; few superficial (spider) varicose veins without redness or swelling mostly at the distal pretibial area Neurologic:  alert & oriented X3.    Speech normal, gait appropriate for age and unassisted Psych--  Cognition and judgment appear intact.  Cooperative with normal attention span and concentration.  Behavior appropriate. No anxious or depressed appearing.    Assessment & Plan:     Diabetes, continue with present care, check A1c  B12 deficiency, due for a shot today, check a CBC.  Anxiety? See history of present illness. Recommend observation, if she has increase problems or associated serious symptoms such as presyncope, chest pain she is to let me know  Follow-up November for a physical

## 2014-12-17 DIAGNOSIS — H43813 Vitreous degeneration, bilateral: Secondary | ICD-10-CM | POA: Diagnosis not present

## 2014-12-17 DIAGNOSIS — E119 Type 2 diabetes mellitus without complications: Secondary | ICD-10-CM | POA: Diagnosis not present

## 2014-12-17 DIAGNOSIS — Z0100 Encounter for examination of eyes and vision without abnormal findings: Secondary | ICD-10-CM | POA: Diagnosis not present

## 2014-12-17 DIAGNOSIS — H40052 Ocular hypertension, left eye: Secondary | ICD-10-CM | POA: Diagnosis not present

## 2014-12-17 LAB — HM DIABETES EYE EXAM

## 2015-01-05 ENCOUNTER — Encounter: Payer: Self-pay | Admitting: Internal Medicine

## 2015-01-05 ENCOUNTER — Ambulatory Visit (HOSPITAL_BASED_OUTPATIENT_CLINIC_OR_DEPARTMENT_OTHER)
Admission: RE | Admit: 2015-01-05 | Discharge: 2015-01-05 | Disposition: A | Discharge status: Home / Self Care | Payer: Medicare Other | Source: Ambulatory Visit | Attending: Internal Medicine | Admitting: Internal Medicine

## 2015-01-05 ENCOUNTER — Ambulatory Visit (INDEPENDENT_AMBULATORY_CARE_PROVIDER_SITE_OTHER): Payer: Medicare Other | Admitting: Internal Medicine

## 2015-01-05 VITALS — BP 122/78 | HR 81 | Temp 98.4°F | Ht 65.0 in | Wt 150.1 lb

## 2015-01-05 DIAGNOSIS — S60221A Contusion of right hand, initial encounter: Secondary | ICD-10-CM

## 2015-01-05 DIAGNOSIS — S62316A Displaced fracture of base of fifth metacarpal bone, right hand, initial encounter for closed fracture: Secondary | ICD-10-CM | POA: Diagnosis not present

## 2015-01-05 DIAGNOSIS — M7989 Other specified soft tissue disorders: Secondary | ICD-10-CM | POA: Diagnosis present

## 2015-01-05 DIAGNOSIS — S62306A Unspecified fracture of fifth metacarpal bone, right hand, initial encounter for closed fracture: Secondary | ICD-10-CM | POA: Diagnosis not present

## 2015-01-05 DIAGNOSIS — S0511XA Contusion of eyeball and orbital tissues, right eye, initial encounter: Secondary | ICD-10-CM | POA: Diagnosis not present

## 2015-01-05 DIAGNOSIS — R269 Unspecified abnormalities of gait and mobility: Secondary | ICD-10-CM

## 2015-01-05 DIAGNOSIS — S62304A Unspecified fracture of fourth metacarpal bone, right hand, initial encounter for closed fracture: Secondary | ICD-10-CM | POA: Insufficient documentation

## 2015-01-05 DIAGNOSIS — W19XXXA Unspecified fall, initial encounter: Secondary | ICD-10-CM | POA: Diagnosis not present

## 2015-01-05 DIAGNOSIS — S0083XA Contusion of other part of head, initial encounter: Secondary | ICD-10-CM

## 2015-01-05 DIAGNOSIS — S62314A Displaced fracture of base of fourth metacarpal bone, right hand, initial encounter for closed fracture: Secondary | ICD-10-CM | POA: Diagnosis not present

## 2015-01-05 NOTE — Progress Notes (Signed)
Pre visit review using our clinic review tool, if applicable. No additional management support is needed unless otherwise documented below in the visit note. 

## 2015-01-05 NOTE — Progress Notes (Signed)
Subjective:    Patient ID: Sierra Klein, female    DOB: 15-Mar-1932, 81 y.o.   MRN: [id number]  DOS:  01/05/2015 Type of visit - description : Acute, here with her husband. Interval history: Yesterday at 6 PM was picking up something from her driveway, she lost her balance, fell forward, landed on her right side. She scraped her knees, and injured her right forehead and right hand. Denies any loss of consciousness, no syncope or presyncopal feeling at the time of the events.  At 3 AM this morning, she was getting out of the bathroom and again had a fall, this time she did not have any major injury, no syncope.    Review of Systems  denies any recent chest pain, difficulty breathing or palpitations No nausea, vomiting, diarrhea No headache. Mild back neck pain without radiation. No upper or lower extremity paresthesias No bladder or bowel incontinence   Past Medical History  Diagnosis Date  . Disturbance, sleep     narcolepsy  . Diabetes mellitus   . B12 deficiency anemia   . 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 Osteoarthritis   . Hyperlipidemia     started med 6/09  . Diverticulosis   . Colon polyps   . Diverticulitis   . Hiatal hernia   . Diverticulosis   . Cholelithiasis     Past Surgical History  Procedure Laterality Date  . Abdominal hysterectomy      no oophorectomy  . Cataract extraction      l- 9/09, r- 3/10  . Colonoscopy  2002  . Belpharoptosis repair    . Breast biopsy  09/1983    left  . Foot surgery  03/1988    cyst removed from right heel  . Pelvic surgery  2003    cysto-recto-enetero cele repair   . Laparoscopic lysis of adhesions  2013     RLQ pain resolved after surgery    History   Social History  . Marital Status: Married    Spouse Name: N/A  . Number of Children: 2  . Years of Education: N/A   Occupational History  . retired    Social History Main Topics  .  Smoking status: Former Smoker    Types: Cigarettes    Quit date: 06/28/1954  . Smokeless tobacco: Never Used  . Alcohol Use: 2.4 oz/week    4 Glasses of wine per week     Comment: 3-4x/week  . Drug Use: No  . Sexual Activity: Not on file   Other Topics Concern  . Not on file   Social History Narrative   Married x 61 years   still drives   Children live @  [place]            Medication List       This list is accurate as of: 01/05/15 11:59 PM.  Always use your most recent med list.               AMBIEN 10 MG tablet  Generic drug:  zolpidem  Take 5 mg by mouth at bedtime as needed.     aspirin 325 MG tablet  Take 325 mg by mouth daily.     calcium carbonate 1250 MG capsule  Take 1,250 mg by mouth 2 (two) times daily with a meal.     cyanocobalamin 1000 MCG tablet  Take 100 mcg by mouth daily.  cyanocobalamin 1000 MCG/ML injection  Commonly known as:  (VITAMIN B-12)  Inject 1 mL (1,000 mcg total) into the muscle once.     diclofenac sodium 1 % Gel  Commonly known as:  VOLTAREN  Apply 2 g topically as needed.     diphenoxylate-atropine 2.5-0.025 MG per tablet  Commonly known as:  LOMOTIL  Take 1 tablet by mouth 3 (three) times daily as needed for diarrhea or loose stools.     DULoxetine 60 MG capsule  Commonly known as:  CYMBALTA  Take 60 mg by mouth daily.     gabapentin 600 MG tablet  Commonly known as:  NEURONTIN  Take 600 mg by mouth 4 (four) times daily.     multivitamin per tablet  Take 1 tablet by mouth daily.     NORCO 5-325 MG per tablet  Generic drug:  HYDROcodone-acetaminophen  Take by mouth.     VITAMIN D-3 PO  Take 1 capsule by mouth daily.           Objective:   Physical Exam  HENT:  Head:    Musculoskeletal:       Hands:  BP 122/78 mmHg  Pulse 81  Temp(Src) 98.4 F (36.9 C) (Oral)  Ht 5\' 5"  (1.651 m)  Wt 150 lb 2 oz (68.096 kg)  BMI 24.98 kg/m2  SpO2 94% General:   Well developed, well nourished .  NAD.  HEENT:  Normocephalic .  EOMI, pupils equal and reactive, orbits: No deformity, TTP on the right. Lungs:  CTA B Normal respiratory effort, no intercostal retractions, no accessory muscle use. Heart: RRR,  no murmur.  no pretibial edema bilaterally   MSK: Left hand and wrist normal Right hand: See graphic Skin: Not pale. Not jaundice. A couple of scrapes at the knees without evidence of cellulitis Neurologic:  alert & oriented X3.  Speech normal, gait : Slightly unsteady, hesitant, able to transfer with help, unable to use her right hand. Motor exam symmetric except for right hand which is not tested due to pain. Psych--  Cognition and judgment appear intact.  Cooperative with normal attention span and concentration.  Behavior appropriate. No anxious or depressed appearing.       Assessment & Plan:    Fall, face and right hand contusion Patient had 2 falls in the last 24 hours, by history no syncope or loss of consciousness. This is the first falls in about a year. Neurological exam is benign. Suspect right hand fracture. Plan: Fall prevention: to uses a cane consistently, declined PT. Would be unable to use a walker due to having pain X-rays Pain control with hydrocodone Tylenol and icing Return to the office one month

## 2015-01-05 NOTE — Patient Instructions (Signed)
Stop by the first floor and get the XR    4 pain use either hydrocodone or OTC Tylenol up to 4 times a day  Ice  your right hand    Next visit in one month  Use a cane consistently

## 2015-01-06 NOTE — Patient Outreach (Signed)
[place] [facility]) Care Management  01/06/2015  LILLIAHNA SCHUBRING 09-Sep-1931 [id number]   Referral per MD, assigned Maury Dus, RN to outreach.  Ronnell Freshwater. [place], [place] Management [place] Assistant Phone: [phone] Fax: [phone]

## 2015-01-11 ENCOUNTER — Other Ambulatory Visit: Payer: Self-pay | Admitting: Orthopedic Surgery

## 2015-01-11 DIAGNOSIS — S62334A Displaced fracture of neck of fourth metacarpal bone, right hand, initial encounter for closed fracture: Secondary | ICD-10-CM | POA: Diagnosis not present

## 2015-01-11 DIAGNOSIS — S62316A Displaced fracture of base of fifth metacarpal bone, right hand, initial encounter for closed fracture: Secondary | ICD-10-CM | POA: Diagnosis not present

## 2015-01-11 NOTE — H&P (Signed)
Sierra Klein is an 81 y.o. female.   CC / Reason for Visit: Right hand injury HPI:.  This patient is a active 81 year old RHD female who presents for evaluation of a right hand injury that occurred when she fell onto an outstretched hand.  In addition to a contusion around her right thigh, she was evaluated for injuries to the right hand, with x-rays and reveal a fourth metacarpal neck fracture and a fifth metacarpal base fracture.  She has been an ulnar gutter splint.  She reports her pain and swelling have decreased greatly.  Past Medical History  Diagnosis Date  . Disturbance, sleep     narcolepsy  . Diabetes mellitus   . B12 deficiency anemia   . 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 Osteoarthritis   . Hyperlipidemia     started med 6/09  . Diverticulosis   . Colon polyps   . Diverticulitis   . Hiatal hernia   . Diverticulosis   . Cholelithiasis     Past Surgical History  Procedure Laterality Date  . Abdominal hysterectomy      no oophorectomy  . Cataract extraction      l- 9/09, r- 3/10  . Colonoscopy  2002  . Belpharoptosis repair    . Breast biopsy  09/1983    left  . Foot surgery  03/1988    cyst removed from right heel  . Pelvic surgery  2003    cysto-recto-enetero cele repair   . Laparoscopic lysis of adhesions  2013     RLQ pain resolved after surgery    Family History  Problem Relation Age of Onset  . Coronary artery disease Sister   . Coronary artery disease Mother     tachycardia  . Coronary artery disease Brother     x 2 brothers  . Hypertension Brother     x 2 brothers  . Hyperlipidemia Brother   . Alzheimer's disease Mother   . Diabetes      numerous cousins  . Colon cancer Neg Hx   . Breast cancer Neg Hx    Social History:  reports that she quit smoking about 60 years ago. Her smoking use included Cigarettes. She has never used smokeless tobacco. She reports that she drinks  about 2.4 oz of alcohol per week. She reports that she does not use illicit drugs.  Allergies:  Allergies  Allergen Reactions  . Sulfonamide Derivatives     No prescriptions prior to admission    No results found for this or any previous visit (from the past 48 hour(s)). No results found.  Review of Systems  All other systems reviewed and are negative.   There were no vitals taken for this visit. Physical Exam  Constitutional:  WD, WN, NAD HEENT:  NCAT, EOMI Neuro/Psych:  Alert & oriented to person, place, and time; appropriate mood & affect Lymphatic: No generalized UE edema or lymphadenopathy Extremities / MSK:  Both UE are normal with respect to appearance, ranges of motion, joint stability, muscle strength/tone, sensation, & perfusion except as otherwise noted:  Right hand has resolving ecchymosis on the ulnar side.  There is tenderness at the fourth metacarpal neck, without significant malrotation.  There is tenderness at the base of the fifth, with incomplete digital flexion limited by discomfort.  Good extension.  NVI.  Labs / Xrays:  3 views of the right hand ordered and obtained today reveals ulnarly  angulated fourth metacarpal distal diaphyseal fracture, and a right fifth CMC fracture subluxation  Assessment: 1.  Right fourth metacarpal neck fracture, mildly angulated 2.  Right fifth CMC fracture subluxation  Plan:  I discussed these findings with her and the implications of continued nonoperative management versus operative reduction and pin stabilization of the fifth CMC joint, particularly.  After some degree of deliberation, she wishes to proceed operatively and this is tentatively scheduled for tomorrow.  I discussed an operative plan that included hopefully closed reduction of these fractures and percutaneous stabilization, proceeding with open reduction if necessary.  On account of being in the operating room, I will assess under fluoroscopy the stability of the  fourth metacarpal and decide whether or not to provide pin fixation for it as well.  The details of the operative procedure were discussed with the patient.  Questions were invited and answered.  In addition to the goal of the procedure, the risks of the procedure to include but not limited to bleeding; infection; damage to the nerves or blood vessels that could result in bleeding, numbness, weakness, chronic pain, and the need for additional procedures; stiffness; the need for revision surgery; and anesthetic risks, were reviewed.  No specific outcome was guaranteed or implied.  Informed consent was obtained.  She is placed back into her removable splint for overnight.  Khylei Wilms A. 01/11/2015, 5:22 PM

## 2015-01-12 ENCOUNTER — Ambulatory Visit (HOSPITAL_COMMUNITY): Payer: Medicare Other

## 2015-01-12 ENCOUNTER — Ambulatory Visit (HOSPITAL_BASED_OUTPATIENT_CLINIC_OR_DEPARTMENT_OTHER): Payer: Medicare Other | Admitting: Certified Registered"

## 2015-01-12 ENCOUNTER — Ambulatory Visit (HOSPITAL_BASED_OUTPATIENT_CLINIC_OR_DEPARTMENT_OTHER)
Admission: RE | Admit: 2015-01-12 | Discharge: 2015-01-12 | Disposition: A | Discharge status: Home / Self Care | Payer: Medicare Other | Source: Ambulatory Visit | Attending: Orthopedic Surgery | Admitting: Orthopedic Surgery

## 2015-01-12 ENCOUNTER — Encounter (HOSPITAL_BASED_OUTPATIENT_CLINIC_OR_DEPARTMENT_OTHER)
Admission: RE | Disposition: A | Discharge status: Home / Self Care | Payer: Self-pay | Source: Ambulatory Visit | Attending: Orthopedic Surgery

## 2015-01-12 ENCOUNTER — Encounter (HOSPITAL_BASED_OUTPATIENT_CLINIC_OR_DEPARTMENT_OTHER): Payer: Self-pay | Admitting: *Deleted

## 2015-01-12 DIAGNOSIS — G47419 Narcolepsy without cataplexy: Secondary | ICD-10-CM | POA: Diagnosis not present

## 2015-01-12 DIAGNOSIS — Y939 Activity, unspecified: Secondary | ICD-10-CM | POA: Diagnosis not present

## 2015-01-12 DIAGNOSIS — Y999 Unspecified external cause status: Secondary | ICD-10-CM | POA: Insufficient documentation

## 2015-01-12 DIAGNOSIS — E119 Type 2 diabetes mellitus without complications: Secondary | ICD-10-CM | POA: Insufficient documentation

## 2015-01-12 DIAGNOSIS — Y929 Unspecified place or not applicable: Secondary | ICD-10-CM | POA: Insufficient documentation

## 2015-01-12 DIAGNOSIS — S7011XA Contusion of right thigh, initial encounter: Secondary | ICD-10-CM | POA: Diagnosis not present

## 2015-01-12 DIAGNOSIS — W19XXXA Unspecified fall, initial encounter: Secondary | ICD-10-CM | POA: Diagnosis not present

## 2015-01-12 DIAGNOSIS — S62334A Displaced fracture of neck of fourth metacarpal bone, right hand, initial encounter for closed fracture: Secondary | ICD-10-CM | POA: Diagnosis not present

## 2015-01-12 DIAGNOSIS — E785 Hyperlipidemia, unspecified: Secondary | ICD-10-CM | POA: Diagnosis not present

## 2015-01-12 DIAGNOSIS — M1990 Unspecified osteoarthritis, unspecified site: Secondary | ICD-10-CM | POA: Diagnosis not present

## 2015-01-12 DIAGNOSIS — S62314A Displaced fracture of base of fourth metacarpal bone, right hand, initial encounter for closed fracture: Secondary | ICD-10-CM | POA: Diagnosis not present

## 2015-01-12 DIAGNOSIS — S62304A Unspecified fracture of fourth metacarpal bone, right hand, initial encounter for closed fracture: Secondary | ICD-10-CM | POA: Diagnosis present

## 2015-01-12 DIAGNOSIS — S62316A Displaced fracture of base of fifth metacarpal bone, right hand, initial encounter for closed fracture: Secondary | ICD-10-CM | POA: Insufficient documentation

## 2015-01-12 DIAGNOSIS — Z87891 Personal history of nicotine dependence: Secondary | ICD-10-CM | POA: Insufficient documentation

## 2015-01-12 DIAGNOSIS — D519 Vitamin B12 deficiency anemia, unspecified: Secondary | ICD-10-CM | POA: Insufficient documentation

## 2015-01-12 DIAGNOSIS — G629 Polyneuropathy, unspecified: Secondary | ICD-10-CM | POA: Diagnosis not present

## 2015-01-12 DIAGNOSIS — Z419 Encounter for procedure for purposes other than remedying health state, unspecified: Secondary | ICD-10-CM

## 2015-01-12 HISTORY — PX: CLOSED REDUCTION METACARPAL WITH PERCUTANEOUS PINNING: SHX5613

## 2015-01-12 LAB — POCT HEMOGLOBIN-HEMACUE: Hemoglobin: 13.4 g/dL (ref 12.0–15.0)

## 2015-01-12 SURGERY — CLOSED REDUCTION, FRACTURE, METACARPAL BONE, WITH PERCUTANEOUS PINNING
Anesthesia: General | Site: Finger | Laterality: Right

## 2015-01-12 MED ORDER — SCOPOLAMINE 1 MG/3DAYS TD PT72: 1.0000 | MEDICATED_PATCH | Freq: Once | TRANSDERMAL | Status: DC | PRN

## 2015-01-12 MED ORDER — EPHEDRINE SULFATE 50 MG/ML IJ SOLN
INTRAMUSCULAR | Status: DC | PRN
  Administered 2015-01-12: 10 mg via INTRAVENOUS

## 2015-01-12 MED ORDER — FENTANYL CITRATE (PF) 100 MCG/2ML IJ SOLN
INTRAMUSCULAR | Status: AC
Start: 2015-01-12 — End: 2015-01-12
  Filled 2015-01-12: qty 4

## 2015-01-12 MED ORDER — DEXAMETHASONE SODIUM PHOSPHATE 10 MG/ML IJ SOLN
INTRAMUSCULAR | Status: DC | PRN
  Administered 2015-01-12: 4 mg via INTRAVENOUS

## 2015-01-12 MED ORDER — BUPIVACAINE-EPINEPHRINE 0.5% -1:200000 IJ SOLN
INTRAMUSCULAR | Status: DC | PRN
  Administered 2015-01-12: 10 mL

## 2015-01-12 MED ORDER — LACTATED RINGERS IV SOLN: INTRAVENOUS | Status: DC

## 2015-01-12 MED ORDER — CEFAZOLIN SODIUM-DEXTROSE 2-3 GM-% IV SOLR
2.0000 g | INTRAVENOUS | Status: AC
  Administered 2015-01-12: 2 g via INTRAVENOUS

## 2015-01-12 MED ORDER — OXYCODONE-ACETAMINOPHEN 5-325 MG PO TABS: 1.0000 | ORAL_TABLET | Freq: Four times a day (QID) | ORAL | Status: DC | PRN

## 2015-01-12 MED ORDER — HYDROMORPHONE HCL 1 MG/ML IJ SOLN
0.2500 mg | INTRAMUSCULAR | Status: DC | PRN
  Administered 2015-01-12: 0.25 mg via INTRAVENOUS

## 2015-01-12 MED ORDER — LIDOCAINE HCL (PF) 1 % IJ SOLN
INTRAMUSCULAR | Status: AC
  Filled 2015-01-12: qty 30

## 2015-01-12 MED ORDER — OXYCODONE-ACETAMINOPHEN 5-325 MG PO TABS: 1.0000 | ORAL_TABLET | ORAL | Status: DC | PRN

## 2015-01-12 MED ORDER — BUPIVACAINE HCL (PF) 0.5 % IJ SOLN
INTRAMUSCULAR | Status: AC
  Filled 2015-01-12: qty 30

## 2015-01-12 MED ORDER — CEFAZOLIN SODIUM-DEXTROSE 2-3 GM-% IV SOLR
INTRAVENOUS | Status: AC
  Filled 2015-01-12: qty 50

## 2015-01-12 MED ORDER — BUPIVACAINE-EPINEPHRINE (PF) 0.5% -1:200000 IJ SOLN
INTRAMUSCULAR | Status: AC
  Filled 2015-01-12: qty 30

## 2015-01-12 MED ORDER — HYDROMORPHONE HCL 1 MG/ML IJ SOLN
INTRAMUSCULAR | Status: AC
  Filled 2015-01-12: qty 1

## 2015-01-12 MED ORDER — GLYCOPYRROLATE 0.2 MG/ML IJ SOLN: 0.2000 mg | Freq: Once | INTRAMUSCULAR | Status: DC | PRN

## 2015-01-12 MED ORDER — PROMETHAZINE HCL 25 MG/ML IJ SOLN: 6.2500 mg | INTRAMUSCULAR | Status: DC | PRN

## 2015-01-12 MED ORDER — ONDANSETRON HCL 4 MG/2ML IJ SOLN
INTRAMUSCULAR | Status: DC | PRN
  Administered 2015-01-12: 4 mg via INTRAVENOUS

## 2015-01-12 MED ORDER — LIDOCAINE HCL (CARDIAC) 20 MG/ML IV SOLN
INTRAVENOUS | Status: DC | PRN
  Administered 2015-01-12: 60 mg via INTRAVENOUS

## 2015-01-12 MED ORDER — PROPOFOL 10 MG/ML IV BOLUS
INTRAVENOUS | Status: DC | PRN
  Administered 2015-01-12: 160 mg via INTRAVENOUS

## 2015-01-12 MED ORDER — MIDAZOLAM HCL 2 MG/2ML IJ SOLN
1.0000 mg | INTRAMUSCULAR | Status: DC | PRN
Start: 2015-01-12 — End: 2015-01-12

## 2015-01-12 MED ORDER — LACTATED RINGERS IV SOLN
INTRAVENOUS | Status: DC
  Administered 2015-01-12 (×2): via INTRAVENOUS

## 2015-01-12 MED ORDER — FENTANYL CITRATE (PF) 100 MCG/2ML IJ SOLN
50.0000 ug | INTRAMUSCULAR | Status: AC | PRN
  Administered 2015-01-12: 25 ug via INTRAVENOUS
  Administered 2015-01-12: 100 ug via INTRAVENOUS
  Administered 2015-01-12: 25 ug via INTRAVENOUS

## 2015-01-12 SURGICAL SUPPLY — 48 items
BANDAGE COBAN STERILE 2 (GAUZE/BANDAGES/DRESSINGS) IMPLANT
BLADE MINI RND TIP GREEN BEAV (BLADE) IMPLANT
BLADE SURG 15 STRL LF DISP TIS (BLADE) ×1 IMPLANT
BLADE SURG 15 STRL SS (BLADE) ×3
BNDG CMPR 9X4 STRL LF SNTH (GAUZE/BANDAGES/DRESSINGS)
BNDG COHESIVE 4X5 TAN STRL (GAUZE/BANDAGES/DRESSINGS) ×3 IMPLANT
BNDG ESMARK 4X9 LF (GAUZE/BANDAGES/DRESSINGS) IMPLANT
BNDG GAUZE ELAST 4 BULKY (GAUZE/BANDAGES/DRESSINGS) ×6 IMPLANT
CHLORAPREP W/TINT 26ML (MISCELLANEOUS) ×3 IMPLANT
CORDS BIPOLAR (ELECTRODE) ×2 IMPLANT
COVER BACK TABLE 60X90IN (DRAPES) ×3 IMPLANT
COVER MAYO STAND STRL (DRAPES) ×3 IMPLANT
CUFF TOURNIQUET SINGLE 18IN (TOURNIQUET CUFF) ×2 IMPLANT
DRAPE C-ARM 42X72 X-RAY (DRAPES) ×3 IMPLANT
DRAPE EXTREMITY T 121X128X90 (DRAPE) ×3 IMPLANT
DRAPE SURG 17X23 STRL (DRAPES) ×3 IMPLANT
DRSG EMULSION OIL 3X3 NADH (GAUZE/BANDAGES/DRESSINGS) ×3 IMPLANT
GAUZE SPONGE 4X4 12PLY STRL (GAUZE/BANDAGES/DRESSINGS) ×3 IMPLANT
GLOVE BIO SURGEON STRL SZ 6.5 (GLOVE) ×2 IMPLANT
GLOVE BIO SURGEON STRL SZ7.5 (GLOVE) ×3 IMPLANT
GLOVE BIO SURGEONS STRL SZ 6.5 (GLOVE) ×1
GLOVE BIOGEL PI IND STRL 7.0 (GLOVE) ×1 IMPLANT
GLOVE BIOGEL PI IND STRL 8 (GLOVE) ×1 IMPLANT
GLOVE BIOGEL PI INDICATOR 7.0 (GLOVE) ×4
GLOVE BIOGEL PI INDICATOR 8 (GLOVE) ×2
GLOVE ECLIPSE 6.5 STRL STRAW (GLOVE) ×3 IMPLANT
GOWN STRL REUS W/TWL XL LVL3 (GOWN DISPOSABLE) ×3 IMPLANT
K-WIRE .045X4 (WIRE) ×6 IMPLANT
K-WIRE .062X4 (WIRE) ×2 IMPLANT
NEEDLE HYPO 22GX1.5 SAFETY (NEEDLE) ×3 IMPLANT
NS IRRIG 1000ML POUR BTL (IV SOLUTION) ×3 IMPLANT
PACK BASIN DAY SURGERY FS (CUSTOM PROCEDURE TRAY) ×3 IMPLANT
PADDING CAST ABS 4INX4YD NS (CAST SUPPLIES) ×4
PADDING CAST ABS COTTON 4X4 ST (CAST SUPPLIES) IMPLANT
RUBBERBAND STERILE (MISCELLANEOUS) ×1 IMPLANT
SPLINT PLASTER CAST XFAST 3X15 (CAST SUPPLIES) ×7 IMPLANT
SPLINT PLASTER CAST XFAST 4X15 (CAST SUPPLIES) IMPLANT
SPLINT PLASTER XTRA FAST SET 4 (CAST SUPPLIES)
SPLINT PLASTER XTRA FASTSET 3X (CAST SUPPLIES) ×14
STOCKINETTE 6  STRL (DRAPES) ×2
STOCKINETTE 6 STRL (DRAPES) ×1 IMPLANT
SUT VICRYL RAPIDE 4-0 (SUTURE) IMPLANT
SUT VICRYL RAPIDE 4/0 PS 2 (SUTURE) ×3 IMPLANT
SYR BULB 3OZ (MISCELLANEOUS) ×3 IMPLANT
SYRINGE 10CC LL (SYRINGE) IMPLANT
TOWEL OR 17X24 6PK STRL BLUE (TOWEL DISPOSABLE) ×3 IMPLANT
TOWEL OR NON WOVEN STRL DISP B (DISPOSABLE) ×1 IMPLANT
UNDERPAD 30X30 (UNDERPADS AND DIAPERS) ×3 IMPLANT

## 2015-01-12 NOTE — Anesthesia Postprocedure Evaluation (Signed)
Anesthesia Post Note  Patient: Sierra Klein  Procedure(s) Performed: Procedure(s) (LRB): PINNING RIGHT 4 & 5 METACARPAL FRACTURES (Right)  Anesthesia type: general  Patient location: PACU  Post pain: Pain level controlled  Post assessment: Patient's Cardiovascular Status Stable  Last Vitals:  Filed Vitals:   01/12/15 1445  BP: 137/80  Pulse: 81  Temp:   Resp: 19    Post vital signs: Reviewed and stable  Level of consciousness: sedated  Complications: No apparent anesthesia complications

## 2015-01-12 NOTE — Interval H&P Note (Signed)
History and Physical Interval Note:  01/12/2015 12:52 PM  Sierra Klein  has presented today for surgery, with the diagnosis of [place], S62.334A, S62.316A  The various methods of treatment have been discussed with the patient and family. After consideration of risks, benefits and other options for treatment, the patient has consented to  Procedure(s): PINNING RIGHT 4 & 5 METACARPAL FRACTURES (Right) as a surgical intervention .  The patient's history has been reviewed, patient examined, no change in status, stable for surgery.  I have reviewed the patient's chart and labs.  Questions were answered to the patient's satisfaction.     Erandi Lemma A.

## 2015-01-12 NOTE — Op Note (Signed)
01/12/2015  12:54 PM  PATIENT:  Sierra Klein  81 y.o. female  PRE-OPERATIVE DIAGNOSIS:  Right fourth and fifth metacarpal fractures  POST-OPERATIVE DIAGNOSIS:  Same  PROCEDURE:  ORIF right fifth metacarpal base fracture and CRPP right fourth metacarpal neck fracture  SURGEON: Rayvon Char. Grandville Silos, MD  PHYSICIAN ASSISTANT: Morley Kos, OPA-C  ANESTHESIA:  general  SPECIMENS:  None  DRAINS:   None  EBL:  less than 50 mL  PREOPERATIVE INDICATIONS:  Sierra Klein is a  81 y.o. female with displaced fractures as described above.  The risks benefits and alternatives were discussed with the patient preoperatively including but not limited to the risks of infection, bleeding, nerve injury, cardiopulmonary complications, the need for revision surgery, among others, and the patient verbalized understanding and consented to proceed.  OPERATIVE IMPLANTS: Combination of 0.045 and 0.062 inch K wires.  OPERATIVE PROCEDURE:  After receiving prophylactic antibiotics, the patient was escorted to the operative theatre and placed in a supine position.  General anesthesia was administered A surgical "time-out" was performed during which the planned procedure, proposed operative site, and the correct patient identity were compared to the operative consent and agreement confirmed by the circulating nurse according to current facility policy.  Following application of a tourniquet to the operative extremity, the exposed skin was prepped with Chloraprep and draped in the usual sterile fashion.  The limb was exsanguinated with an Esmarch bandage and the tourniquet inflated to approximately 176mmHg higher than systolic BP.  An attempt at closed reduction and percutaneous pinning of the fifth metacarpal base fracture did not yield what I believe to be acceptable reduction of the articular surface and there was still some degree of malrotation. Therefore a straight linear longitudinal dorsal incision was made  over the fifth CMC joint. Tendons were reflected radially and ulnarly and subperiosteal dissection carried out sharply exposing the base of the fifth metacarpal. The articular surface was quite comminuted, and the shaft tended to displace ulnarly and proximally. Ultimately, it was held out to length and secured with a 0.062 inch K wire driven through the shaft and into the fourth metacarpal, helping to neutralize forces of dislocation at the base of the fifth metacarpal. Using a clamp to help further secure the reduction and having disimpacted some articular surface fragments, the base reduction fragments was secured with a 0.062 and a 0.045 inch K wire. The latter of which was placed to help buttress the articular surface fragments. It was placed more transversely. There was still some instability noted at the fourth metacarpal fracture and I decided to reduce and fix this indirectly, by driving a 4.854 inch K wire from the ulnar side of the hand across the neck so the fifth, fourth, and third metacarpals. This helped secure the stability of the fourth metacarpal neck fracture. Final images were obtained. Alignment was deemed to be acceptable. Half percent Marcaine with epinephrine was instilled in and around the incision to help with postoperative pain, as well as some around the ulnar nerve at the wrist. Tourniquet was released, the wound irrigated, and hemostasis obtained with bipolar electrocautery and direct pressure. The skin was closed with 4-0 Vicryl Rapide running horizontal mattress suture.  A bulky dressing was applied with volar plaster component, and in addition the ring and small fingers were buddy taped, and then those 2 were buddy taped to the long finger. Alignment of the digits clinically was good. She was awakened and taken to the recovery room in stable condition, breathing  spontaneously  DISPOSITION: She'll be discharged home today with typical instructions, returning in 10-15 days for  reevaluation, with new x-rays of the right hand out of the splint and likely placement of a short arm cast.

## 2015-01-12 NOTE — Transfer of Care (Cosign Needed)
Immediate Anesthesia Transfer of Care Note  Patient: Sierra Klein  Procedure(s) Performed: Procedure(s): PINNING RIGHT 4 & 5 METACARPAL FRACTURES (Right)  Patient Location: PACU  Anesthesia Type:General  Level of Consciousness: awake, sedated and responds to stimulation  Airway & Oxygen Therapy: Patient Spontanous Breathing and Patient connected to face mask oxygen  Post-op Assessment: Report given to RN, Post -op Vital signs reviewed and stable and Patient moving all extremities  Post vital signs: Reviewed and stable  Last Vitals:  Filed Vitals:   01/12/15 1404  BP:   Pulse: 83  Temp:   Resp: 15    Complications: No apparent anesthesia complications

## 2015-01-12 NOTE — Discharge Instructions (Signed)
Discharge Instructions   You have a dressing with a plaster splint incorporated in it. Move your fingers as much as possible, making a full fist and fully opening the fist. Elevate your hand to reduce pain & swelling of the digits.  Ice over the operative site may be helpful to reduce pain & swelling.  DO NOT USE HEAT. Pain medicine has been prescribed for you.  Use your medicine as needed over the first 48 hours, and then you can begin to taper your use.  You may use Tylenol in place of your prescribed pain medication, but not IN ADDITION to it. Leave the dressing in place until you return to our office.  You may shower, but keep the bandage clean & dry.  You may drive a car when you are off of prescription pain medications and can safely control your vehicle with both hands.   Please call [phone] during normal business hours or [phone] after hours for any problems. Including the following:  - excessive redness of the incisions - drainage for more than 4 days - fever of more than 101.5 F  *Please note that pain medications will not be refilled after hours or on weekends.   Post Anesthesia Home Care Instructions  Activity: Get plenty of rest for the remainder of the day. A responsible adult should stay with you for 24 hours following the procedure.  For the next 24 hours, DO NOT: -Drive a car -Paediatric nurse -Drink alcoholic beverages -Take any medication unless instructed by your physician -Make any legal decisions or sign important papers.  Meals: Start with liquid foods such as gelatin or soup. Progress to regular foods as tolerated. Avoid greasy, spicy, heavy foods. If nausea and/or vomiting occur, drink only clear liquids until the nausea and/or vomiting subsides. Call your physician if vomiting continues.  Special Instructions/Symptoms: Your throat may feel dry or sore from the anesthesia or the breathing tube placed in your throat during surgery. If this  causes discomfort, gargle with warm salt water. The discomfort should disappear within 24 hours.  If you had a scopolamine patch placed behind your ear for the management of post- operative nausea and/or vomiting:  1. The medication in the patch is effective for 72 hours, after which it should be removed.  Wrap patch in a tissue and discard in the trash. Wash hands thoroughly with soap and water. 2. You may remove the patch earlier than 72 hours if you experience unpleasant side effects which may include dry mouth, dizziness or visual disturbances. 3. Avoid touching the patch. Wash your hands with soap and water after contact with the patch.

## 2015-01-12 NOTE — Anesthesia Procedure Notes (Signed)
Procedure Name: LMA Insertion Date/Time: 01/12/2015 1:07 PM Performed by: Baxter Flattery Pre-anesthesia Checklist: Patient identified, Emergency Drugs available, Suction available and Patient being monitored Patient Re-evaluated:Patient Re-evaluated prior to inductionOxygen Delivery Method: Circle System Utilized Preoxygenation: Pre-oxygenation with 100% oxygen Intubation Type: IV induction Ventilation: Mask ventilation without difficulty LMA: LMA inserted LMA Size: 4.0 Number of attempts: 1 Airway Equipment and Method: Bite block Placement Confirmation: positive ETCO2 and breath sounds checked- equal and bilateral Tube secured with: Tape Dental Injury: Teeth and Oropharynx as per pre-operative assessment

## 2015-01-12 NOTE — Anesthesia Preprocedure Evaluation (Signed)
Anesthesia Evaluation  Patient identified by MRN, date of birth, ID band Patient awake    Reviewed: Allergy & Precautions, NPO status , Patient's Chart, lab work & pertinent test results  Airway Mallampati: II  TM Distance: >3 FB Neck ROM: Full    Dental no notable dental hx. (+) Dental Advisory Given, Caps   Pulmonary former smoker,    Pulmonary exam normal       Cardiovascular negative cardio ROS Normal cardiovascular exam    Neuro/Psych Anxiety  Neuromuscular disease negative psych ROS   GI/Hepatic Neg liver ROS, hiatal hernia,   Endo/Other  negative endocrine ROS  Renal/GU negative Renal ROS  negative genitourinary   Musculoskeletal negative musculoskeletal ROS (+)   Abdominal   Peds negative pediatric ROS (+)  Hematology negative hematology ROS (+)   Anesthesia Other Findings   Reproductive/Obstetrics negative OB ROS                             Anesthesia Physical Anesthesia Plan  ASA: III  Anesthesia Plan: General   Post-op Pain Management:    Induction: Intravenous  Airway Management Planned: LMA  Additional Equipment:   Intra-op Plan:   Post-operative Plan: Extubation in OR  Informed Consent: I have reviewed the patients History and Physical, chart, labs and discussed the procedure including the risks, benefits and alternatives for the proposed anesthesia with the patient or authorized representative who has indicated his/her understanding and acceptance.   Dental advisory given  Plan Discussed with: CRNA and Anesthesiologist  Anesthesia Plan Comments:         Anesthesia Quick Evaluation

## 2015-01-13 ENCOUNTER — Encounter (HOSPITAL_BASED_OUTPATIENT_CLINIC_OR_DEPARTMENT_OTHER): Payer: Self-pay | Admitting: Orthopedic Surgery

## 2015-01-26 NOTE — Patient Outreach (Signed)
[place] [facility]) Care Management  01/26/2015  TAMINA CYPHERS 1932/05/09 [id number]   Assigned Tomasa Rand, RN to outreach.  Ronnell Freshwater. [place], [place] Management [place] Assistant Phone: [phone] Fax: [phone]

## 2015-01-27 ENCOUNTER — Other Ambulatory Visit: Payer: Self-pay

## 2015-01-27 DIAGNOSIS — S62316D Displaced fracture of base of fifth metacarpal bone, right hand, subsequent encounter for fracture with routine healing: Secondary | ICD-10-CM | POA: Diagnosis not present

## 2015-01-27 DIAGNOSIS — S62334D Displaced fracture of neck of fourth metacarpal bone, right hand, subsequent encounter for fracture with routine healing: Secondary | ICD-10-CM | POA: Diagnosis not present

## 2015-01-27 NOTE — Patient Outreach (Signed)
New referral outreach received to me today:  Placed call to patient for identified herself.  Explained purpose of call. Explained [facility] services to patient.  Patient reports that she has had some recent falls. States that her falls happen on uneven pavement. Reports that she occasionally uses her cane.  Reports that she does not believe it is a balance problem. She thinks that it is because of uneven surfaces.   Patient reports that she recently had surgery on her hand and is recovering from this right now. Patient reports that she is going back to hand surgeon today for a follow up appointment.  Patient denies any other concerns. States that her diabetes is under good control. Her last Hgb A1c was 6.1  Discussed fall prevention with patient to include no throw rugs, no cords across the floor. Discussed importance of getting your balance before taking a step.  Discussed concerns for future injuries with falls.   PLAN: At this time patient has declined services. Patient is in agreement to me to mail information about [facility] program and if she changes her mind she will call me back. I provided my contact information.    I will notify Lurline Del of patient declining services. I will mail letter to patient with information about [facility]. I will notify MD of patients wishes not to participate.  Tomasa Rand, RN, BSN, CEN [facility] [employer] [phone]

## 2015-02-03 NOTE — Patient Outreach (Signed)
[place] [facility]) Care Management  01/27/2015  Sierra Klein 1932-03-10 [id number]   Notification from Tomasa Rand, RN to close case due to patient refused [place] Management services.  Thanks, Ronnell Freshwater. [place], [place] Assistant Phone: [phone] Fax: [phone]

## 2015-02-05 ENCOUNTER — Ambulatory Visit: Payer: Medicare Other | Admitting: Internal Medicine

## 2015-02-24 DIAGNOSIS — S62316D Displaced fracture of base of fifth metacarpal bone, right hand, subsequent encounter for fracture with routine healing: Secondary | ICD-10-CM | POA: Diagnosis not present

## 2015-02-24 DIAGNOSIS — S62334D Displaced fracture of neck of fourth metacarpal bone, right hand, subsequent encounter for fracture with routine healing: Secondary | ICD-10-CM | POA: Diagnosis not present

## 2015-03-29 DIAGNOSIS — S62316D Displaced fracture of base of fifth metacarpal bone, right hand, subsequent encounter for fracture with routine healing: Secondary | ICD-10-CM | POA: Diagnosis not present

## 2015-04-08 DIAGNOSIS — H40012 Open angle with borderline findings, low risk, left eye: Secondary | ICD-10-CM | POA: Diagnosis not present

## 2015-04-08 DIAGNOSIS — Z961 Presence of intraocular lens: Secondary | ICD-10-CM | POA: Diagnosis not present

## 2015-04-08 DIAGNOSIS — H40052 Ocular hypertension, left eye: Secondary | ICD-10-CM | POA: Diagnosis not present

## 2015-04-20 DIAGNOSIS — R269 Unspecified abnormalities of gait and mobility: Secondary | ICD-10-CM | POA: Diagnosis not present

## 2015-04-20 DIAGNOSIS — G4710 Hypersomnia, unspecified: Secondary | ICD-10-CM | POA: Diagnosis not present

## 2015-04-20 DIAGNOSIS — G603 Idiopathic progressive neuropathy: Secondary | ICD-10-CM | POA: Diagnosis not present

## 2015-04-20 DIAGNOSIS — G4700 Insomnia, unspecified: Secondary | ICD-10-CM | POA: Diagnosis not present

## 2015-05-13 ENCOUNTER — Other Ambulatory Visit: Payer: Self-pay

## 2015-05-25 ENCOUNTER — Ambulatory Visit (INDEPENDENT_AMBULATORY_CARE_PROVIDER_SITE_OTHER): Payer: Medicare Other | Admitting: Internal Medicine

## 2015-05-25 ENCOUNTER — Encounter: Payer: Self-pay | Admitting: Internal Medicine

## 2015-05-25 VITALS — BP 118/74 | HR 68 | Temp 97.7°F | Ht 65.0 in | Wt 150.1 lb

## 2015-05-25 DIAGNOSIS — Z1239 Encounter for other screening for malignant neoplasm of breast: Secondary | ICD-10-CM

## 2015-05-25 DIAGNOSIS — Z0000 Encounter for general adult medical examination without abnormal findings: Secondary | ICD-10-CM

## 2015-05-25 DIAGNOSIS — Z23 Encounter for immunization: Secondary | ICD-10-CM | POA: Diagnosis not present

## 2015-05-25 DIAGNOSIS — F32A Depression, unspecified: Secondary | ICD-10-CM

## 2015-05-25 DIAGNOSIS — Z09 Encounter for follow-up examination after completed treatment for conditions other than malignant neoplasm: Secondary | ICD-10-CM

## 2015-05-25 DIAGNOSIS — E538 Deficiency of other specified B group vitamins: Secondary | ICD-10-CM

## 2015-05-25 DIAGNOSIS — E119 Type 2 diabetes mellitus without complications: Secondary | ICD-10-CM | POA: Diagnosis not present

## 2015-05-25 DIAGNOSIS — F329 Major depressive disorder, single episode, unspecified: Secondary | ICD-10-CM | POA: Diagnosis not present

## 2015-05-25 DIAGNOSIS — E785 Hyperlipidemia, unspecified: Secondary | ICD-10-CM | POA: Diagnosis not present

## 2015-05-25 DIAGNOSIS — Z780 Asymptomatic menopausal state: Secondary | ICD-10-CM

## 2015-05-25 DIAGNOSIS — Z87898 Personal history of other specified conditions: Secondary | ICD-10-CM

## 2015-05-25 LAB — COMPREHENSIVE METABOLIC PANEL
ALK PHOS: 151 U/L — AB (ref 39–117)
ALT: 16 U/L (ref 0–35)
AST: 19 U/L (ref 0–37)
Albumin: 3.8 g/dL (ref 3.5–5.2)
BILIRUBIN TOTAL: 0.7 mg/dL (ref 0.2–1.2)
BUN: 15 mg/dL (ref 6–23)
CO2: 30 meq/L (ref 19–32)
CREATININE: 0.68 mg/dL (ref 0.40–1.20)
Calcium: 9.9 mg/dL (ref 8.4–10.5)
Chloride: 107 mEq/L (ref 96–112)
GFR: 88.14 mL/min (ref >60.00)
GLUCOSE: 148 mg/dL — AB (ref 70–99)
Potassium: 5.1 mEq/L (ref 3.5–5.1)
Sodium: 143 mEq/L (ref 135–145)
Total Protein: 6 g/dL (ref 6.0–8.3)

## 2015-05-25 LAB — LIPID PANEL
CHOL/HDL RATIO: 4
Cholesterol: 144 mg/dL (ref 0–200)
HDL: 34.9 mg/dL — ABNORMAL LOW (ref >39.00)
LDL Cholesterol: 80 mg/dL (ref 0–99)
NONHDL: 108.69
Triglycerides: 141 mg/dL (ref 0.0–149.0)
VLDL: 28.2 mg/dL (ref 0.0–40.0)

## 2015-05-25 LAB — MICROALBUMIN / CREATININE URINE RATIO
Creatinine,U: 28.3 mg/dL
Microalb Creat Ratio: 2.5 mg/g (ref 0.0–30.0)

## 2015-05-25 LAB — HEMOGLOBIN A1C: HEMOGLOBIN A1C: 6 % (ref 4.6–6.5)

## 2015-05-25 LAB — VITAMIN B12: VITAMIN B 12: 178 pg/mL — AB (ref 211–911)

## 2015-05-25 LAB — FOLATE

## 2015-05-25 NOTE — Progress Notes (Signed)
Pre visit review using our clinic review tool, if applicable. No additional management support is needed unless otherwise documented below in the visit note. 

## 2015-05-25 NOTE — Patient Instructions (Signed)
Get your blood work before you leave    Take a  B12 supplement OTC every day  Next visit in 6 months, please make an appointment.   Fall Prevention and Home Safety Falls cause injuries and can affect all age groups. It is possible to use preventive measures to significantly decrease the likelihood of falls. There are many simple measures which can make your home safer and prevent falls. OUTDOORS  Repair cracks and edges of walkways and driveways.  Remove high doorway thresholds.  Trim shrubbery on the main path into your home.  Have good outside lighting.  Clear walkways of tools, rocks, debris, and clutter.  Check that handrails are not broken and are securely fastened. Both sides of steps should have handrails.  Have leaves, snow, and ice cleared regularly.  Use sand or salt on walkways during winter months.  In the garage, clean up grease or oil spills. BATHROOM  Install night lights.  Install grab bars by the toilet and in the tub and shower.  Use non-skid mats or decals in the tub or shower.  Place a plastic non-slip stool in the shower to sit on, if needed.  Keep floors dry and clean up all water on the floor immediately.  Remove soap buildup in the tub or shower on a regular basis.  Secure bath mats with non-slip, double-sided rug tape.  Remove throw rugs and tripping hazards from the floors. BEDROOMS  Install night lights.  Make sure a bedside light is easy to reach.  Do not use oversized bedding.  Keep a telephone by your bedside.  Have a firm chair with side arms to use for getting dressed.  Remove throw rugs and tripping hazards from the floor. KITCHEN  Keep handles on pots and pans turned toward the center of the stove. Use back burners when possible.  Clean up spills quickly and allow time for drying.  Avoid walking on wet floors.  Avoid hot utensils and knives.  Position shelves so they are not too high or low.  Place commonly used  objects within easy reach.  If necessary, use a sturdy step stool with a grab bar when reaching.  Keep electrical cables out of the way.  Do not use floor polish or wax that makes floors slippery. If you must use wax, use non-skid floor wax.  Remove throw rugs and tripping hazards from the floor. STAIRWAYS  Never leave objects on stairs.  Place handrails on both sides of stairways and use them. Fix any loose handrails. Make sure handrails on both sides of the stairways are as long as the stairs.  Check carpeting to make sure it is firmly attached along stairs. Make repairs to worn or loose carpet promptly.  Avoid placing throw rugs at the top or bottom of stairways, or properly secure the rug with carpet tape to prevent slippage. Get rid of throw rugs, if possible.  Have an electrician put in a light switch at the top and bottom of the stairs. OTHER FALL PREVENTION TIPS  Wear low-heel or rubber-soled shoes that are supportive and fit well. Wear closed toe shoes.  When using a stepladder, make sure it is fully opened and both spreaders are firmly locked. Do not climb a closed stepladder.  Add color or contrast paint or tape to grab bars and handrails in your home. Place contrasting color strips on first and last steps.  Learn and use mobility aids as needed. Install an electrical emergency response system.  Turn on  lights to avoid dark areas. Replace light bulbs that burn out immediately. Get light switches that glow.  Arrange furniture to create clear pathways. Keep furniture in the same place.  Firmly attach carpet with non-skid or double-sided tape.  Eliminate uneven floor surfaces.  Select a carpet pattern that does not visually hide the edge of steps.  Be aware of all pets. OTHER HOME SAFETY TIPS  Set the water temperature for 120 F (48.8 C).  Keep emergency numbers on or near the telephone.  Keep smoke detectors on every level of the home and near sleeping  areas. Document Released: 06/02/2002 Document Revised: 12/12/2011 Document Reviewed: 09/01/2011 [facility] Patient Information 2015 [place], [place]. This information is not intended to replace advice given to you by your health care provider. Make sure you discuss any questions you have with your health care provider.   Preventive Care for Adults Ages 70 and over  Blood pressure check.** / Every 1 to 2 years.  Lipid and cholesterol check.**/ Every 5 years beginning at age 8.  Lung cancer screening. / Every year if you are aged 69-80 years and have a 30-pack-year history of smoking and currently smoke or have quit within the past 15 years. Yearly screening is stopped once you have quit smoking for at least 15 years or develop a health problem that would prevent you from having lung cancer treatment.  Fecal occult blood test (FOBT) of stool. / Every year beginning at age 58 and continuing until age 31. You may not have to do this test if you get a colonoscopy every 10 years.  Flexible sigmoidoscopy** or colonoscopy.** / Every 5 years for a flexible sigmoidoscopy or every 10 years for a colonoscopy beginning at age 14 and continuing until age 31.  Hepatitis C blood test.** / For all people born from [birth year] through 1963 and any individual with known risks for hepatitis C.  Abdominal aortic aneurysm (AAA) screening.** / A one-time screening for ages 82 to 76 years who are current or former smokers.  Skin self-exam. / Monthly.  Influenza vaccine. / Every year.  Tetanus, diphtheria, and acellular pertussis (Tdap/Td) vaccine.** / 1 dose of Td every 10 years.  Varicella vaccine.** / Consult your health care provider.  Zoster vaccine.** / 1 dose for adults aged 40 years or older.  Pneumococcal 13-valent conjugate (PCV13) vaccine.** / Consult your health care provider.  Pneumococcal polysaccharide (PPSV23) vaccine.** / 1 dose for all adults aged 41 years and older.  Meningococcal vaccine.** /  Consult your health care provider.  Hepatitis A vaccine.** / Consult your health care provider.  Hepatitis B vaccine.** / Consult your health care provider.  Haemophilus influenzae type b (Hib) vaccine.** / Consult your health care provider. **Family history and personal history of risk and conditions may change your health care provider's recommendations. Document Released: 08/08/2001 Document Revised: 06/17/2013 Document Reviewed: 11/07/2010 [facility] Patient Information 2015 [place], [place]. This information is not intended to replace advice given to you by your health care provider. Make sure you discuss any questions you have with your health care provider.

## 2015-05-25 NOTE — Assessment & Plan Note (Addendum)
Td 2007  Pneumonia shot 2007 prevnar 01-2014 shingles shot  2008  Flu shot today  Used to see gyn Dr Nori Riis, was rec no more PAPs per pt  MMG 10-14 neg --> ordering a MMG  Last Cscope 03-2011 Dr. Fuller Plan , had diverticuli and diverticulitis, next per GI Cont healthy lifestyle

## 2015-05-25 NOTE — Progress Notes (Signed)
Subjective:    Patient ID: Sierra Klein, female    DOB: 1932-03-28, 81 y.o.   MRN: [id number]  DOS:  05/25/2015 Type of visit - description :    Here for Medicare AWV: 1. Risk factors based on Past M, S, F history: reviewed   2. Physical Activities:  +yard  work , less active than before (DJD , neuropathy) 3. Depression/mood: lost husband, appropriately sad, extensive counseling  4. Hearing: mild problems, at baseline 5. ADL's: independent, still drives    6. Fall Risk: no recent falls, prevention discussed   7. Home Safety:  does feel safe at home   8. Height, weight, &visual acuity: see Vs, vision corrected w/ glasses, sees eye doctor regulalrly 9. Counseling: yes   10. Labs ordered based on risk factors: yes   11.Referral Coordination, if needed   12.Care Plan, see a/p 13.Cognitive Assessment:  motor skills decreased,cognition and mood seem appropriate 14. Care team updated 15. End of life care-- has a HC - POA   We also discussed the following DM: On diet control, CBGs around 100. B12 deficiency: Non taking  shots lately is simply difficult to come to the office Narcolepsy : Follow-up elsewhere, well-controlled   Review of Systems Constitutional: No fever. No chills. No unexplained wt changes. No unusual sweats  HEENT: No dental problems, no ear discharge, no facial swelling, no voice changes. No eye discharge, no eye  redness , no  intolerance to light   Respiratory: No wheezing , no  difficulty breathing. No cough , no mucus production  Cardiovascular: No CP, no palpitations, left ankle swelling on and off for the last 2 years, worse at the end of the day. No pain or claudication  GI: no nausea, no vomiting, no diarrhea , no  abdominal pain.  No blood in the stools. No dysphagia, no odynophagia    Endocrine: No polyphagia, no polyuria , no polydipsia  GU: No dysuria, gross hematuria, difficulty urinating. No urinary urgency, no frequency.  Musculoskeletal: No  joint swellings or unusual aches or pains  Skin: No change in the color of the skin, palor , no  Rash  Allergic, immunologic: No environmental allergies , no  food allergies  Neurological: No dizziness no  syncope. No headaches. No diplopia, no slurred, no slurred speech, no motor deficits, no facial  Numbness  Hematological: No enlarged lymph nodes, no easy bruising , no unusual bleedings  Psychiatry: No suicidal ideas, no hallucinations, no beavior problems, no confusion.  Lost her husband,  no suicidal  Past Medical History  Diagnosis Date  . Disturbance, sleep     narcolepsy  . Diabetes mellitus   . B12 deficiency anemia   . Neuropathy ([place]) 1998    not releated to DM w/u neg (idopatic progressive neuropathy)= f/u [facility]  . Osteoarthritis   . Hyperlipidemia     started med 6/09  . Diverticulosis   . Colon polyps   . Diverticulitis   . Hiatal hernia   . Diverticulosis   . Cholelithiasis     Past Surgical History  Procedure Laterality Date  . Abdominal hysterectomy      no oophorectomy  . Cataract extraction      l- 9/09, r- 3/10  . Colonoscopy  2002  . Belpharoptosis repair    . Breast biopsy  09/1983    left  . Foot surgery  03/1988    cyst removed from right heel  . Pelvic surgery  2003  cysto-recto-enetero cele repair   . Laparoscopic lysis of adhesions  2013     RLQ pain resolved after surgery  . Closed reduction metacarpal with percutaneous pinning Right 01/12/2015    Procedure: PINNING RIGHT 4 & 5 METACARPAL FRACTURES;  Surgeon: Milly Jakob, MD;  Location: [facility];  Service: Orthopedics;  Laterality: Right;    Social History   Social History  . Marital Status: Married    Spouse Name: N/A  . Number of Children: 2  . Years of Education: N/A   Occupational History  . retired    Social History Main Topics  . Smoking status: Former Smoker    Types: Cigarettes    Quit date: 06/28/1954  . Smokeless tobacco: Never  Used  . Alcohol Use: 2.4 oz/week    4 Glasses of wine per week     Comment: 3-4x/week  . Drug Use: No  . Sexual Activity: Not on file   Other Topics Concern  . Not on file   Social History Narrative   Married x 61 years, lost husband to a pulm emboli 02-2015    still drives   Children live @  [place] [facility] [place]         Family History  Problem Relation Age of Onset  . Coronary artery disease Sister   . Coronary artery disease Mother     tachycardia  . Coronary artery disease Brother     x 2 brothers  . Hypertension Brother     x 2 brothers  . Hyperlipidemia Brother   . Alzheimer's disease Mother   . Diabetes      numerous cousins  . Colon cancer Neg Hx   . Breast cancer Neg Hx       Medication List       This list is accurate as of: 05/25/15 11:59 PM.  Always use your most recent med list.               AMBIEN 10 MG tablet  Generic drug:  zolpidem  Take 5 mg by mouth at bedtime as needed.     aspirin 325 MG tablet  Take 325 mg by mouth daily.     calcium carbonate 1250 MG capsule  Take 1,250 mg by mouth 2 (two) times daily with a meal.     cyanocobalamin 1000 MCG tablet  Take 100 mcg by mouth daily.     cyanocobalamin 1000 MCG/ML injection  Commonly known as:  (VITAMIN B-12)  Inject 1 mL (1,000 mcg total) into the muscle once.     diclofenac sodium 1 % Gel  Commonly known as:  VOLTAREN  Apply 2 g topically as needed.     diphenoxylate-atropine 2.5-0.025 MG tablet  Commonly known as:  LOMOTIL  Take 1 tablet by mouth 3 (three) times daily as needed for diarrhea or loose stools.     DULoxetine 60 MG capsule  Commonly known as:  CYMBALTA  Take 120 mg by mouth daily.     gabapentin 600 MG tablet  Commonly known as:  NEURONTIN  Take 600 mg by mouth 4 (four) times daily.     multivitamin per tablet  Take 1 tablet by mouth daily.     NORCO 5-325 MG tablet  Generic drug:  HYDROcodone-acetaminophen  Take by mouth.     NUVIGIL 150 MG tablet    Generic drug:  Armodafinil  Take 150 mg by mouth daily as needed.     VITAMIN D-3 PO  Take  1 capsule by mouth daily.           Objective:   Physical Exam BP 118/74 mmHg  Pulse 68  Temp(Src) 97.7 F (36.5 C) (Oral)  Ht 5\' 5"  (1.651 m)  Wt 150 lb 2 oz (68.096 kg)  BMI 24.98 kg/m2  SpO2 93% General:   Well developed, well nourished . NAD.  HEENT:  Normocephalic . Face symmetric, atraumatic Neck: No thyromegaly Lungs:  CTA B Normal respiratory effort, no intercostal retractions, no accessory muscle use. Heart: RRR,  no murmur.  Mild peri-ankle edema on the left, calves symmetric, no TTP   Abdomen:  Not distended, soft, non-tender. No rebound or rigidity. No mass,organomegaly Skin: Not pale. Not jaundice Neurologic:  alert & oriented X3.  Speech normal, gait appropriate for age and unassisted Psych--  Cognition and judgment appear intact.  Cooperative with normal attention span and concentration.  Behavior appropriate. No anxious or depressed appearing except when we talk about the recent lost of her husband.    Assessment & Plan:   Assessment> DM Hyperlipidemia Depression Narcolepsy Dr Bertrum Sol B12 deficiency Neuropathy, 1998, not related to DM, workup negative, idiopathic. Dr Jannifer Franklin [facility] neurology Chronic L ankle edema DJD Wrist Fx 12-2014 DEXA12-07 (-) and 7-10 normal ---> cont w/ Calcium, vit d GI:  --HH --colon polyps --Diverticulitis --cholelithiasis per CT 2012  PLAN: DM: On diet control, check an A1c, microalbumin Hyperlipidemia: Diet-controlled check FLP Depression:  Lost husband, extensively counseled, on Cymbalta  B12 deficiency: Has a hard time getting shots, recommend supplements by mouth and check labs Recent wrist fracture, check a bone density test L Ankle edema: Recommend obsevation and the use of a compression stockin RTC 6 months

## 2015-05-26 DIAGNOSIS — Z09 Encounter for follow-up examination after completed treatment for conditions other than malignant neoplasm: Secondary | ICD-10-CM | POA: Insufficient documentation

## 2015-06-04 ENCOUNTER — Telehealth: Payer: Self-pay | Admitting: Behavioral Health

## 2015-06-04 NOTE — Telephone Encounter (Signed)
Patient reported that she's been having some vertigo since this past Wednesday, but it's not severe. She voiced that it only occurs when she rises from a sitting position and while walking short distances, however she uses a cane. The patient informed writer that she's drinking plenty of fluids and rising slowly to ambulate. Also, she does not report any headache, difficult breathing, palpitations, vomiting or decreased urine output. Patient has already scheduled an appointment for Monday, 06/07/15 at 1:00 PM. Advised patient that if symptom worsen to call the office, seek an urgent care or emergency department. She voiced understanding and did not have any further questions or concerns.

## 2015-06-07 ENCOUNTER — Ambulatory Visit (INDEPENDENT_AMBULATORY_CARE_PROVIDER_SITE_OTHER): Payer: Medicare Other | Admitting: Internal Medicine

## 2015-06-07 ENCOUNTER — Encounter (INDEPENDENT_AMBULATORY_CARE_PROVIDER_SITE_OTHER): Payer: Self-pay

## 2015-06-07 ENCOUNTER — Encounter: Payer: Self-pay | Admitting: Internal Medicine

## 2015-06-07 VITALS — BP 124/72 | HR 80 | Temp 98.0°F | Ht 65.0 in | Wt 149.1 lb

## 2015-06-07 DIAGNOSIS — R42 Dizziness and giddiness: Secondary | ICD-10-CM | POA: Diagnosis not present

## 2015-06-07 DIAGNOSIS — E538 Deficiency of other specified B group vitamins: Secondary | ICD-10-CM

## 2015-06-07 DIAGNOSIS — Z09 Encounter for follow-up examination after completed treatment for conditions other than malignant neoplasm: Secondary | ICD-10-CM

## 2015-06-07 MED ORDER — CYANOCOBALAMIN 1000 MCG/ML IJ SOLN
1000.0000 ug | Freq: Once | INTRAMUSCULAR | Status: AC
  Administered 2015-06-07: 1000 ug via INTRAMUSCULAR

## 2015-06-07 NOTE — Assessment & Plan Note (Signed)
DM: On diet control, check an A1c, microalbumin Hyperlipidemia: Diet-controlled check FLP Depression:  Lost husband, extensively counseled, on Cymbalta  B12 deficiency: Has a hard time getting shots, recommend supplements by mouth and check labs Recent wrist fracture, check a bone density test L Ankle edema: Recommend obsevation and the use of a compression stockin RTC 6 months

## 2015-06-07 NOTE — Progress Notes (Signed)
Pre visit review using our clinic review tool, if applicable. No additional management support is needed unless otherwise documented below in the visit note. 

## 2015-06-07 NOTE — Addendum Note (Signed)
Addended by: Kathlene November E on: 06/07/2015 07:03 PM   Modules accepted: Miquel Dunn

## 2015-06-07 NOTE — Progress Notes (Addendum)
Subjective:    Patient ID: Sierra Klein, female    DOB: 1932/04/10, 81 y.o.   MRN: [id number]  DOS:  06/07/2015 Type of visit - description : Acute visit Interval history:  Symptoms started 5 days ago immediately after she woke up and got up from bed: Had a Severe Episodes of Dizziness, described as her head "floating in water", spinning. She went back to bed, quickly improved but since then she still has mild symptoms whenever she stand up quickly, turns in bed, moves  her head. As long as she is quiet and sitting in her chair she is ok. Sometimes has mild dizziness by closing year her eyes but she is not sure if she felt unsteady from closing her eyes before these episodes.  Review of Systems  No fever chills No recent URI symptoms No nausea vomiting No fall or head injury Headache, diplopia, sore speech or motor deficit.  Past Medical History  Diagnosis Date  . Disturbance, sleep     narcolepsy  . Diabetes mellitus   . B12 deficiency anemia   . Neuropathy ([place]) 1998    not releated to DM w/u neg (idopatic progressive neuropathy)= f/u [facility]  . Osteoarthritis   . Hyperlipidemia     started med 6/09  . Diverticulosis   . Colon polyps   . Diverticulitis   . Hiatal hernia   . Diverticulosis   . Cholelithiasis     Past Surgical History  Procedure Laterality Date  . Abdominal hysterectomy      no oophorectomy  . Cataract extraction      l- 9/09, r- 3/10  . Colonoscopy  2002  . Belpharoptosis repair    . Breast biopsy  09/1983    left  . Foot surgery  03/1988    cyst removed from right heel  . Pelvic surgery  2003    cysto-recto-enetero cele repair   . Laparoscopic lysis of adhesions  2013     RLQ pain resolved after surgery  . Closed reduction metacarpal with percutaneous pinning Right 01/12/2015    Procedure: PINNING RIGHT 4 & 5 METACARPAL FRACTURES;  Surgeon: Milly Jakob, MD;  Location: [facility];  Service: Orthopedics;   Laterality: Right;    Social History   Social History  . Marital Status: Married    Spouse Name: N/A  . Number of Children: 2  . Years of Education: N/A   Occupational History  . retired    Social History Main Topics  . Smoking status: Former Smoker    Types: Cigarettes    Quit date: 06/28/1954  . Smokeless tobacco: Never Used  . Alcohol Use: 2.4 oz/week    4 Glasses of wine per week     Comment: 3-4x/week  . Drug Use: No  . Sexual Activity: Not on file   Other Topics Concern  . Not on file   Social History Narrative   Married x 61 years, lost husband to a pulm emboli 02-2015    still drives   Children live @  [place] [facility] [place]            Medication List       This list is accurate as of: 06/07/15  6:57 PM.  Always use your most recent med list.               AMBIEN 10 MG tablet  Generic drug:  zolpidem  Take 5 mg by mouth at bedtime as needed.  aspirin 325 MG tablet  Take 325 mg by mouth daily.     calcium carbonate 1250 MG capsule  Take 1,250 mg by mouth 2 (two) times daily with a meal.     cyanocobalamin 1000 MCG tablet  Take 100 mcg by mouth daily.     cyanocobalamin 1000 MCG/ML injection  Commonly known as:  (VITAMIN B-12)  Inject 1 mL (1,000 mcg total) into the muscle once.     diclofenac sodium 1 % Gel  Commonly known as:  VOLTAREN  Apply 2 g topically as needed.     diphenoxylate-atropine 2.5-0.025 MG tablet  Commonly known as:  LOMOTIL  Take 1 tablet by mouth 3 (three) times daily as needed for diarrhea or loose stools.     DULoxetine 60 MG capsule  Commonly known as:  CYMBALTA  Take 120 mg by mouth daily.     gabapentin 600 MG tablet  Commonly known as:  NEURONTIN  Take 600 mg by mouth 4 (four) times daily.     multivitamin per tablet  Take 1 tablet by mouth daily.     NORCO 5-325 MG tablet  Generic drug:  HYDROcodone-acetaminophen  Take by mouth.     NUVIGIL 150 MG tablet  Generic drug:  Armodafinil  Take 150 mg by  mouth daily as needed.     VITAMIN D-3 PO  Take 1 capsule by mouth daily.           Objective:   Physical Exam BP 124/72 mmHg  Pulse 80  Temp(Src) 98 F (36.7 C) (Oral)  Ht 5\' 5"  (1.651 m)  Wt 149 lb 2 oz (67.643 kg)  BMI 24.82 kg/m2  SpO2 96% General:   Well developed, well nourished . NAD.  Neck:  Full range of motion. Supple.  Normal carotid pulses. HEENT: Normocephalic . Face symmetric, atraumatic Lungs:  CTA B Normal respiratory effort, no intercostal retractions, no accessory muscle use. Heart: RRR,  no murmur.  No pretibial edema bilaterally  Skin: Exposed areas without rash. Not pale. Not jaundice Neurologic:  alert & oriented X3.  Speech normal, gait appropriate for age assisted by a cane. Strength symmetric and appropriate for age. DTRs symmetric. EOMI Romberg: Got slightly dizzy when she closed her eyes Psych: Cognition and judgment appear intact.  Cooperative with normal attention span and concentration.  Behavior appropriate. No anxious or depressed appearing.    Assessment & Plan:   Assessment> DM Hyperlipidemia Depression Narcolepsy Dr Bertrum Sol B12 deficiency Neuropathy, 1998, not related to DM, workup negative, idiopathic. Dr Jannifer Franklin [facility] neurology Chronic L ankle edema DJD Wrist Fx 12-2014 DEXA12-07 (-) and 7-10 normal ---> cont w/ Calcium, vit d GI:  --HH --colon polyps --Diverticulitis --cholelithiasis per CT 2012  PLAN: Dizziness: Acute episode started 5 days ago, much improved, has peripheral features except for the fact that she gets slightly dizzy when she closed her eyes although she is not sure if that happened even before the onset of acute dizziness. We discussed observation versus MRI, we elected observation. She will call if she does not continue to improve gradually, also call if dizziness  severe or associated with the stroke like sx

## 2015-06-07 NOTE — Patient Instructions (Signed)
Rest, drink plenty of fluids, call if not gradually improving. Call anytime if you have severe dizziness or other symptoms.

## 2015-06-07 NOTE — Assessment & Plan Note (Signed)
Dizziness: Acute episode started 5 days ago, much improved, has peripheral features except for the fact that she gets slightly dizzy when she closed her eyes although she is not sure if that happened even before the onset of acute dizziness. We discussed observation versus MRI, we elected observation. She will call if she does not continue to improve gradually, also call if dizziness  severe or associated with the stroke like sx

## 2015-07-13 ENCOUNTER — Other Ambulatory Visit: Payer: Self-pay | Admitting: Internal Medicine

## 2015-07-13 ENCOUNTER — Telehealth: Payer: Self-pay

## 2015-07-13 DIAGNOSIS — E2839 Other primary ovarian failure: Secondary | ICD-10-CM

## 2015-07-13 DIAGNOSIS — R921 Mammographic calcification found on diagnostic imaging of breast: Secondary | ICD-10-CM

## 2015-07-13 DIAGNOSIS — Z87898 Personal history of other specified conditions: Secondary | ICD-10-CM

## 2015-07-13 NOTE — Telephone Encounter (Signed)
Bone Density re-ordered.

## 2015-07-13 NOTE — Telephone Encounter (Signed)
-----   Message from Elveria Royals sent at 07/13/2015 11:53 AM EST ----- Regarding: FW: Referral Dx Contact: [phone]   ----- Message -----    From: Quintin Alto    Sent: 07/13/2015  11:38 AM      To: Elveria Royals Subject: Referral Dx                                    [facility] on [facility] changed to Estrogen Deficiency so that her insurance will pay for it.

## 2015-08-24 ENCOUNTER — Ambulatory Visit
Admission: RE | Admit: 2015-08-24 | Discharge: 2015-08-24 | Disposition: A | Discharge status: Home / Self Care | Payer: Medicare Other | Source: Ambulatory Visit | Attending: Internal Medicine | Admitting: Internal Medicine

## 2015-08-24 DIAGNOSIS — M85832 Other specified disorders of bone density and structure, left forearm: Secondary | ICD-10-CM | POA: Diagnosis not present

## 2015-08-24 DIAGNOSIS — R921 Mammographic calcification found on diagnostic imaging of breast: Secondary | ICD-10-CM | POA: Diagnosis not present

## 2015-09-17 ENCOUNTER — Telehealth: Payer: Self-pay | Admitting: Internal Medicine

## 2015-10-06 DIAGNOSIS — Z961 Presence of intraocular lens: Secondary | ICD-10-CM | POA: Diagnosis not present

## 2015-10-06 DIAGNOSIS — H40012 Open angle with borderline findings, low risk, left eye: Secondary | ICD-10-CM | POA: Diagnosis not present

## 2015-10-26 DIAGNOSIS — G4700 Insomnia, unspecified: Secondary | ICD-10-CM | POA: Diagnosis not present

## 2015-10-26 DIAGNOSIS — G4719 Other hypersomnia: Secondary | ICD-10-CM | POA: Diagnosis not present

## 2015-10-26 DIAGNOSIS — Z6825 Body mass index (BMI) 25.0-25.9, adult: Secondary | ICD-10-CM | POA: Diagnosis not present

## 2015-10-26 DIAGNOSIS — G603 Idiopathic progressive neuropathy: Secondary | ICD-10-CM | POA: Diagnosis not present

## 2015-11-03 DIAGNOSIS — H40013 Open angle with borderline findings, low risk, bilateral: Secondary | ICD-10-CM | POA: Diagnosis not present

## 2015-11-03 DIAGNOSIS — H40053 Ocular hypertension, bilateral: Secondary | ICD-10-CM | POA: Diagnosis not present

## 2015-11-23 ENCOUNTER — Ambulatory Visit: Payer: Medicare Other | Admitting: Internal Medicine

## 2015-11-30 ENCOUNTER — Encounter: Payer: Self-pay | Admitting: Internal Medicine

## 2015-11-30 ENCOUNTER — Ambulatory Visit (INDEPENDENT_AMBULATORY_CARE_PROVIDER_SITE_OTHER): Payer: Medicare Other | Admitting: Internal Medicine

## 2015-11-30 VITALS — BP 124/68 | HR 60 | Temp 97.8°F | Ht 65.0 in | Wt 151.2 lb

## 2015-11-30 DIAGNOSIS — R42 Dizziness and giddiness: Secondary | ICD-10-CM

## 2015-11-30 DIAGNOSIS — E538 Deficiency of other specified B group vitamins: Secondary | ICD-10-CM

## 2015-11-30 DIAGNOSIS — F329 Major depressive disorder, single episode, unspecified: Secondary | ICD-10-CM

## 2015-11-30 DIAGNOSIS — F32A Depression, unspecified: Secondary | ICD-10-CM

## 2015-11-30 LAB — VITAMIN B12: Vitamin B-12: 739 pg/mL (ref 211–911)

## 2015-11-30 MED ORDER — CYANOCOBALAMIN 1000 MCG/ML IJ SOLN
1000.0000 ug | Freq: Once | INTRAMUSCULAR | Status: AC
  Administered 2015-11-30: 1000 ug via INTRAMUSCULAR

## 2015-11-30 NOTE — Progress Notes (Signed)
Pre visit review using our clinic review tool, if applicable. No additional management support is needed unless otherwise documented below in the visit note. 

## 2015-11-30 NOTE — Progress Notes (Signed)
Subjective:    Patient ID: Sierra Klein, female    DOB: [birth year], 82 y.o.   MRN: [id number]  DOS:  11/30/2015 Type of visit - description : Routine checkup Interval history:  Her main concern today is depression, lost her husband 02-2015, still feeling very sad, down, withdraw; she could sleep all day long if left alone. Does not like to go out or do things but she enjoys her family when they visit her.   Review of Systems   no suicidal ideas   Past Medical History  Diagnosis Date  . Disturbance, sleep     narcolepsy  . Diabetes mellitus   . B12 deficiency anemia   . Neuropathy ([place]) 1998    not releated to DM w/u neg (idopatic progressive neuropathy)= f/u [facility]  . Osteoarthritis   . Hyperlipidemia     started med 6/09  . Diverticulosis   . Colon polyps   . Diverticulitis   . Hiatal hernia   . Diverticulosis   . Cholelithiasis     Past Surgical History  Procedure Laterality Date  . Abdominal hysterectomy      no oophorectomy  . Cataract extraction      l- 9/09, r- 3/10  . Colonoscopy  2002  . Belpharoptosis repair    . Breast biopsy  09/1983    left  . Foot surgery  03/1988    cyst removed from right heel  . Pelvic surgery  2003    cysto-recto-enetero cele repair   . Laparoscopic lysis of adhesions  2013     RLQ pain resolved after surgery  . Closed reduction metacarpal with percutaneous pinning Right 01/12/2015    Procedure: PINNING RIGHT 4 & 5 METACARPAL FRACTURES;  Surgeon: Milly Jakob, MD;  Location: [facility];  Service: Orthopedics;  Laterality: Right;    Social History   Social History  . Marital Status: Widowed    Spouse Name: N/A  . Number of Children: 2  . Years of Education: N/A   Occupational History  . retired    Social History Main Topics  . Smoking status: Former Smoker    Types: Cigarettes    Quit date: 06/28/1954  . Smokeless tobacco: Never Used  . Alcohol Use: 2.4 oz/week    4 Glasses of wine per  week     Comment: 3-4x/week  . Drug Use: No  . Sexual Activity: Not on file   Other Topics Concern  . Not on file   Social History Narrative   Married x 61 years, lost husband to a pulm emboli 02-2015    still drives   Children live @  [facility] [place]            Medication List       This list is accurate as of: 11/30/15 11:59 PM.  Always use your most recent med list.               AMBIEN 10 MG tablet  Generic drug:  zolpidem  Take 5 mg by mouth at bedtime as needed.     aspirin 325 MG tablet  Take 325 mg by mouth daily.     calcium carbonate 1250 MG capsule  Take 1,250 mg by mouth 2 (two) times daily with a meal.     cyanocobalamin 1000 MCG tablet  Take 100 mcg by mouth daily.     diclofenac sodium 1 % Gel  Commonly known as:  VOLTAREN  Apply 2 g  topically as needed.     diphenoxylate-atropine 2.5-0.025 MG tablet  Commonly known as:  LOMOTIL  Take 1 tablet by mouth 3 (three) times daily as needed for diarrhea or loose stools.     DULoxetine 60 MG capsule  Commonly known as:  CYMBALTA  Take 120 mg by mouth daily.     gabapentin 600 MG tablet  Commonly known as:  NEURONTIN  Take 600 mg by mouth 4 (four) times daily.     multivitamin per tablet  Take 1 tablet by mouth daily.     NORCO 5-325 MG tablet  Generic drug:  HYDROcodone-acetaminophen  Take by mouth.     NUVIGIL 150 MG tablet  Generic drug:  Armodafinil  Take 150 mg by mouth daily as needed.     timolol 0.5 % ophthalmic solution  Commonly known as:  BETIMOL  Place 1 drop into both eyes daily.     VITAMIN D-3 PO  Take 1 capsule by mouth daily.           Objective:   Physical Exam BP 124/68 mmHg  Pulse 60  Temp(Src) 97.8 F (36.6 C) (Oral)  Ht 5\' 5"  (1.651 m)  Wt 151 lb 4 oz (68.607 kg)  BMI 25.17 kg/m2  SpO2 96% General:   Well developed, well nourished . NAD.  HEENT:  Normocephalic . Face symmetric, atraumatic Skin: Not pale. Not jaundice Neurologic:  alert & oriented  X3.  Speech normal, gait appropriate for age and unassisted Psych--  Cognition and judgment appear intact.  Cooperative with normal attention span and concentration.  Behavior appropriate. No anxious or depressed appearing.      Assessment & Plan:   Assessment> DM Hyperlipidemia Depression Narcolepsy Dr Bertrum Sol B12 deficiency Neuropathy, 1998, not related to DM, workup negative, idiopathic. Dr [facility]; hydrocodone per Dr Jannifer [facility] Chronic L ankle edema DJD   Wrist Fx 12-2014 DEXA 12-07 (-) and 7-10 normal; T score -1.8 (07-2015) GI:  --HH --colon polyps --Diverticulitis --cholelithiasis per CT 2012  PLAN: Depression: Loss husband 02-2015, was on cymbalta already d/t neuropathy; feeling depressed, not suicidal. Patient is extensively counseled today, recommend to see one of our counselors, information provided, continue Cymbalta, reassess in 2 months. If needed we could add another medication, Wellbutrin? Will consult Dr. [facility] if the need arise. B12 deficiency: On oral therapy, check a B12 now and provide a B12 shot. Dizziness: Resolved RTC 2 months  Today, I spent more than 25   min with the patient: >50% of the time counseling regards Depression, listening to her concerns and feelings, counseling.

## 2015-11-30 NOTE — Patient Instructions (Signed)
GO TO THE FRONT DESK Schedule your next appointment for a  checkup in 2 months. No fasting  Consider see a Social worker.

## 2015-12-01 NOTE — Assessment & Plan Note (Signed)
Depression: Loss husband 02-2015, was on cymbalta already d/t neuropathy; feeling depressed, not suicidal. Patient is extensively counseled today, recommend to see one of our counselors, information provided, continue Cymbalta, reassess in 2 months. If needed we could add another medication, Wellbutrin? Will consult Dr. [facility] if the need arise. B12 deficiency: On oral therapy, check a B12 now and provide a B12 shot. Dizziness: Resolved RTC 2 months

## 2016-01-11 NOTE — Telephone Encounter (Signed)
Completed.

## 2016-02-01 ENCOUNTER — Ambulatory Visit (INDEPENDENT_AMBULATORY_CARE_PROVIDER_SITE_OTHER): Payer: Medicare Other | Admitting: Internal Medicine

## 2016-02-01 ENCOUNTER — Encounter: Payer: Self-pay | Admitting: Internal Medicine

## 2016-02-01 VITALS — BP 130/82 | HR 59 | Temp 97.9°F | Resp 12 | Ht 65.0 in | Wt 152.1 lb

## 2016-02-01 DIAGNOSIS — F32A Depression, unspecified: Secondary | ICD-10-CM

## 2016-02-01 DIAGNOSIS — Z09 Encounter for follow-up examination after completed treatment for conditions other than malignant neoplasm: Secondary | ICD-10-CM

## 2016-02-01 DIAGNOSIS — F329 Major depressive disorder, single episode, unspecified: Secondary | ICD-10-CM

## 2016-02-01 NOTE — Progress Notes (Addendum)
Subjective:    Patient ID: Sierra Klein, female    DOB: Aug 13, 1931, 82 y.o.   MRN: [id number]  DOS:  02/01/2016 Type of visit - description : Follow-up  Interval history: Here for follow-up on depression. She feels about the same, was not able to see the counselor yet. Has an appointment pending   Review of Systems Denies suicidal ideas  Past Medical History:  Diagnosis Date  . B12 deficiency anemia   . Cholelithiasis   . Colon polyps   . Diabetes mellitus   . Disturbance, sleep    narcolepsy  . Diverticulitis   . Diverticulosis   . Diverticulosis   . Hiatal hernia   . Hyperlipidemia    started med 6/09  . Neuropathy ([place]) 1998   not releated to DM w/u neg (idopatic progressive neuropathy)= f/u [facility]  . Osteoarthritis     Past Surgical History:  Procedure Laterality Date  . ABDOMINAL HYSTERECTOMY     no oophorectomy  . BELPHAROPTOSIS REPAIR    . BREAST BIOPSY  09/1983   left  . CATARACT EXTRACTION     l- 9/09, r- 3/10  . CLOSED REDUCTION METACARPAL WITH PERCUTANEOUS PINNING Right 01/12/2015   Procedure: PINNING RIGHT 4 & 5 METACARPAL FRACTURES;  Surgeon: Milly Jakob, MD;  Location: [facility];  Service: Orthopedics;  Laterality: Right;  . COLONOSCOPY  2002  . FOOT SURGERY  03/1988   cyst removed from right heel  . LAPAROSCOPIC LYSIS OF ADHESIONS  2013    RLQ pain resolved after surgery  . pelvic surgery  2003   cysto-recto-enetero cele repair     Social History   Social History  . Marital status: Widowed    Spouse name: N/A  . Number of children: 2  . Years of education: N/A   Occupational History  . retired Retired   Social History Main Topics  . Smoking status: Former Smoker    Types: Cigarettes    Quit date: 06/28/1954  . Smokeless tobacco: Never Used  . Alcohol use 2.4 oz/week    4 Glasses of wine per week     Comment: 3-4x/week  . Drug use: No  . Sexual activity: Not on file   Other Topics Concern  . Not on  file   Social History Narrative   Married x 61 years, lost husband to a pulm emboli 02-2015    still drives   Children live @  [place]            Medication List       Accurate as of 02/01/16  1:11 PM. Always use your most recent med list.          AMBIEN 10 MG tablet Generic drug:  zolpidem Take 5 mg by mouth at bedtime as needed.   aspirin 325 MG tablet Take 325 mg by mouth daily.   calcium carbonate 1250 MG capsule Take 1,250 mg by mouth 2 (two) times daily with a meal.   cyanocobalamin 1000 MCG tablet Take 100 mcg by mouth daily.   diclofenac sodium 1 % Gel Commonly known as:  VOLTAREN Apply 2 g topically as needed.   diphenoxylate-atropine 2.5-0.025 MG tablet Commonly known as:  LOMOTIL Take 1 tablet by mouth 3 (three) times daily as needed for diarrhea or loose stools.   DULoxetine 60 MG capsule Commonly known as:  CYMBALTA Take 120 mg by mouth daily.   gabapentin 600 MG tablet Commonly known as:  NEURONTIN  Take 600 mg by mouth 4 (four) times daily.   multivitamin per tablet Take 1 tablet by mouth daily.   NORCO 5-325 MG tablet Generic drug:  HYDROcodone-acetaminophen Take by mouth.   NUVIGIL 150 MG tablet Generic drug:  Armodafinil Take 150 mg by mouth daily as needed.   timolol 0.5 % ophthalmic solution Commonly known as:  BETIMOL Place 1 drop into both eyes daily.   VITAMIN D-3 PO Take 1 capsule by mouth daily.          Objective:   Physical Exam BP 130/82 (BP Location: Left Arm, Patient Position: Sitting, Cuff Size: Normal)   Pulse (!) 59   Temp 97.9 F (36.6 C) (Oral)   Resp 12   Ht 5\' 5"  (1.651 m)   Wt 152 lb 2 oz (69 kg)   SpO2 90%   BMI 25.31 kg/m  General:   Well developed, well nourished . NAD.  HEENT:  Normocephalic . Face symmetric, atraumatic   Neurologic:  alert & oriented X3.  Speech normal, gait appropriate for age and unassisted Psych--  Cognition and judgment appear intact.  Cooperative with  normal attention span and concentration.  Behavior appropriate. No anxious or depressed appearing.      Assessment & Plan:   Assessment> DM Hyperlipidemia Depression Narcolepsy Dr Bertrum Sol B12 deficiency Neuropathy, 1998, not related to DM, workup negative, idiopathic. Dr [facility]; hydrocodone per Dr Jannifer Franklin Chronic L ankle edema DJD   Wrist Fx 12-2014 DEXA 12-07 (-) and 7-10 normal; T score -1.8 (07-2015) GI:  --HH --colon polyps --Diverticulitis --cholelithiasis per CT 2012  PLAN: Depression: Unchanged since last visit, I'm considering add Wellbutrin but she is on Cymbalta. I discussed the case with the patient's neurologist, she is also somewhat reluctant to use the combination particularly because she has a high dose of Cymbalta. Options: Wean off Cymbalta, start a traditional SSRI plus Wellbutrin Refer to psychiatry. Addendum:  Spoke with the patient today,  she elected to switch from a SNRI to a SSRI. We agreed on the following: Decreased Cymbalta to one tablet a day for 2 weeks, then take 1 tablet every other day plus fluoxetine 20 mg daily. Consider add Wellbutrin later  JP 02-04-16 RTC 4 weeks.

## 2016-02-01 NOTE — Patient Instructions (Signed)
Next visit in 2 months  Will call and let you know about your medication

## 2016-02-01 NOTE — Progress Notes (Signed)
Pre visit review using our clinic review tool, if applicable. No additional management support is needed unless otherwise documented below in the visit note. 

## 2016-02-03 DIAGNOSIS — H40012 Open angle with borderline findings, low risk, left eye: Secondary | ICD-10-CM | POA: Diagnosis not present

## 2016-02-03 DIAGNOSIS — Z961 Presence of intraocular lens: Secondary | ICD-10-CM | POA: Diagnosis not present

## 2016-02-03 DIAGNOSIS — H40053 Ocular hypertension, bilateral: Secondary | ICD-10-CM | POA: Diagnosis not present

## 2016-02-04 MED ORDER — FLUOXETINE HCL 20 MG PO TABS: 20.0000 mg | ORAL_TABLET | Freq: Every day | ORAL | 1 refills | Status: DC

## 2016-02-04 NOTE — Assessment & Plan Note (Addendum)
Depression: Unchanged since last visit, I'm considering add Wellbutrin but she is on Cymbalta. I discussed the case with the patient's neurologist, she is also somewhat reluctant to use the combination particularly because she has a high dose of Cymbalta. Options: Wean off Cymbalta, start a traditional SSRI plus Wellbutrin Refer to psychiatry. Addendum:  Spoke with the patient today,  she elected to switch from a SNRI to a SSRI. We agreed on the following: Decreased Cymbalta to one tablet a day for 2 weeks, then take 1 tablet every other day plus fluoxetine 20 mg daily. Consider add Wellbutrin later  JP 02-04-16 RTC 4 weeks.

## 2016-02-11 ENCOUNTER — Ambulatory Visit (INDEPENDENT_AMBULATORY_CARE_PROVIDER_SITE_OTHER): Payer: Medicare Other | Admitting: Psychology

## 2016-02-11 DIAGNOSIS — F4323 Adjustment disorder with mixed anxiety and depressed mood: Secondary | ICD-10-CM

## 2016-02-14 ENCOUNTER — Telehealth: Payer: Self-pay | Admitting: Internal Medicine

## 2016-02-14 NOTE — Telephone Encounter (Signed)
We are switching her from Cymbalta to fluoxetine. Since the last visit she decreased Cymbalta to one tablet daily and so far is feeling well. Starting 02/18/2016 will take 1 Cymbalta qod and start fluoxetine Starting 03/04/2016 will stay on fluoxetine only Follow-up with me as  schedule 03/14/2016

## 2016-03-03 ENCOUNTER — Ambulatory Visit (INDEPENDENT_AMBULATORY_CARE_PROVIDER_SITE_OTHER): Payer: Medicare Other | Admitting: Psychology

## 2016-03-03 DIAGNOSIS — F4323 Adjustment disorder with mixed anxiety and depressed mood: Secondary | ICD-10-CM | POA: Diagnosis not present

## 2016-03-14 ENCOUNTER — Ambulatory Visit (INDEPENDENT_AMBULATORY_CARE_PROVIDER_SITE_OTHER): Payer: Medicare Other | Admitting: Internal Medicine

## 2016-03-14 ENCOUNTER — Encounter: Payer: Self-pay | Admitting: Internal Medicine

## 2016-03-14 VITALS — BP 124/66 | HR 58 | Temp 97.9°F | Resp 14 | Ht 65.0 in | Wt 155.2 lb

## 2016-03-14 DIAGNOSIS — G629 Polyneuropathy, unspecified: Secondary | ICD-10-CM

## 2016-03-14 DIAGNOSIS — Z23 Encounter for immunization: Secondary | ICD-10-CM | POA: Diagnosis not present

## 2016-03-14 DIAGNOSIS — F329 Major depressive disorder, single episode, unspecified: Secondary | ICD-10-CM | POA: Diagnosis not present

## 2016-03-14 DIAGNOSIS — F32A Depression, unspecified: Secondary | ICD-10-CM

## 2016-03-14 DIAGNOSIS — E1140 Type 2 diabetes mellitus with diabetic neuropathy, unspecified: Secondary | ICD-10-CM | POA: Diagnosis not present

## 2016-03-14 LAB — MICROALBUMIN / CREATININE URINE RATIO
CREATININE, U: 17.9 mg/dL
MICROALB/CREAT RATIO: 3.9 mg/g (ref 0.0–30.0)
Microalb, Ur: <0.7 mg/dL (ref 0.0–1.9)

## 2016-03-14 LAB — HEMOGLOBIN A1C: Hgb A1c MFr Bld: 6.8 % — ABNORMAL HIGH (ref 4.6–6.5)

## 2016-03-14 NOTE — Patient Instructions (Addendum)
Labs today  Stop fluoxetine  Restart cymbalta 60: 1 tab a day x 1 week, then 2 tablets a day  please see a psychiatrist Call if depression worsen  Next visit for a physical in 2-3 months

## 2016-03-14 NOTE — Progress Notes (Signed)
Subjective:    Patient ID: Sierra Klein, female    DOB: [birth year], 82 y.o.   MRN: [id number]  DOS:  03/14/2016 Type of visit - description : Follow-up Interval history: Since the last visit, we switch from Cymbalta to fluoxetine, emotionally feels about the same however neuropathy symptoms have definitely increased and are affecting her quality of life.   Review of Systems Denies nausea, vomiting, suicidal ideas.   Past Medical History:  Diagnosis Date  . B12 deficiency anemia   . Cholelithiasis   . Colon polyps   . Diabetes mellitus   . Disturbance, sleep    narcolepsy  . Diverticulitis   . Hiatal hernia   . Hyperlipidemia    started med 6/09  . Neuropathy ([place]) 1998   not releated to DM w/u neg (idopatic progressive neuropathy)= f/u [facility]  . Osteoarthritis     Past Surgical History:  Procedure Laterality Date  . ABDOMINAL HYSTERECTOMY     no oophorectomy  . BELPHAROPTOSIS REPAIR    . BREAST BIOPSY  09/1983   left  . CATARACT EXTRACTION     l- 9/09, r- 3/10  . CLOSED REDUCTION METACARPAL WITH PERCUTANEOUS PINNING Right 01/12/2015   Procedure: PINNING RIGHT 4 & 5 METACARPAL FRACTURES;  Surgeon: Milly Jakob, MD;  Location: [facility];  Service: Orthopedics;  Laterality: Right;  . FOOT SURGERY  03/1988   cyst removed from right heel  . LAPAROSCOPIC LYSIS OF ADHESIONS  2013    RLQ pain resolved after surgery  . pelvic surgery  2003   cysto-recto-enetero cele repair     Social History   Social History  . Marital status: Widowed    Spouse name: N/A  . Number of children: 2  . Years of education: N/A   Occupational History  . retired Retired   Social History Main Topics  . Smoking status: Former Smoker    Types: Cigarettes    Quit date: 06/28/1954  . Smokeless tobacco: Never Used  . Alcohol use 2.4 oz/week    4 Glasses of wine per week     Comment: 3-4x/week  . Drug use: No  . Sexual activity: Not on file   Other Topics  Concern  . Not on file   Social History Narrative   Married x 61 years, lost husband to a pulm emboli 02-2015    still drives   Children live @  [place]            Medication List       Accurate as of 03/14/16  1:24 PM. Always use your most recent med list.          AMBIEN 10 MG tablet Generic drug:  zolpidem Take 5 mg by mouth at bedtime as needed.   aspirin 325 MG tablet Take 325 mg by mouth daily.   calcium carbonate 1250 MG capsule Take 1,250 mg by mouth 2 (two) times daily with a meal.   cyanocobalamin 1000 MCG tablet Take 100 mcg by mouth daily.   diclofenac sodium 1 % Gel Commonly known as:  VOLTAREN Apply 2 g topically as needed.   diphenoxylate-atropine 2.5-0.025 MG tablet Commonly known as:  LOMOTIL Take 1 tablet by mouth 3 (three) times daily as needed for diarrhea or loose stools.   DULoxetine 60 MG capsule Commonly known as:  CYMBALTA Take 60 mg by mouth every other day.   FLUoxetine 20 MG tablet Commonly known as:  PROZAC Take 1 tablet (  20 mg total) by mouth daily.   gabapentin 600 MG tablet Commonly known as:  NEURONTIN Take 600 mg by mouth 4 (four) times daily.   multivitamin per tablet Take 1 tablet by mouth daily.   NORCO 5-325 MG tablet Generic drug:  HYDROcodone-acetaminophen Take by mouth.   NUVIGIL 150 MG tablet Generic drug:  Armodafinil Take 150 mg by mouth daily as needed.   timolol 0.5 % ophthalmic solution Commonly known as:  BETIMOL Place 1 drop into both eyes daily.   VITAMIN D-3 PO Take 1 capsule by mouth daily.          Objective:   Physical Exam BP 124/66 (BP Location: Left Arm, Patient Position: Sitting, Cuff Size: Small)   Pulse (!) 58   Temp 97.9 F (36.6 C) (Oral)   Resp 14   Ht 5\' 5"  (1.651 m)   Wt 155 lb 4 oz (70.4 kg)   SpO2 98%   BMI 25.83 kg/m   General:   Well developed, well nourished . NAD.  HEENT:  Normocephalic . Face symmetric, atraumatic Neurologic:  alert & oriented  X3.  Speech normal, gait appropriate for age and unassisted Psych--  Cognition and judgment appear intact.  Cooperative with normal attention span and concentration.  Behavior appropriate. No anxious or depressed appearing.      Assessment & Plan:  Assessment> DM Hyperlipidemia Depression Narcolepsy Dr Bertrum Sol B12 deficiency Neuropathy, 1998, not related to DM, workup negative, idiopathic. Dr [facility]ann[facility]; hydrocodone per Dr Jannifer Franklin Chronic L ankle edema DJD   Wrist Fx 12-2014 DEXA 12-07 (-) and 7-10 normal; T score -1.8 (07-2015) GI:  --HH --colon polyps --Diverticulitis --cholelithiasis per CT 2012  PLAN: Depression: changed Cymbalta to fluoxetine and feels about the same emotionally but neuropathy sx have resurface. Plan: Go back on Cymbalta, start fluoxetine, needs to see psychiatry for further advice (they may be able to combine Cymbalta and Wellbutrin) Neuropathy: We'll have to go back on Cymbalta, see above. DM: Due for labs. Primary care: Flu shot RTC 2 or 3 months, CPX

## 2016-03-14 NOTE — Progress Notes (Signed)
Pre visit review using our clinic review tool, if applicable. No additional management support is needed unless otherwise documented below in the visit note. 

## 2016-03-15 NOTE — Assessment & Plan Note (Signed)
Depression: changed Cymbalta to fluoxetine and feels about the same emotionally but neuropathy sx have resurface. Plan: Go back on Cymbalta, start fluoxetine, needs to see psychiatry for further advice (they may be able to combine Cymbalta and Wellbutrin) Neuropathy: We'll have to go back on Cymbalta, see above. DM: Due for labs. Primary care: Flu shot RTC 2 or 3 months, CPX

## 2016-03-22 ENCOUNTER — Ambulatory Visit (INDEPENDENT_AMBULATORY_CARE_PROVIDER_SITE_OTHER): Payer: Medicare Other | Admitting: Psychology

## 2016-03-22 DIAGNOSIS — F4323 Adjustment disorder with mixed anxiety and depressed mood: Secondary | ICD-10-CM

## 2016-04-11 ENCOUNTER — Ambulatory Visit: Payer: Medicare Other | Admitting: Internal Medicine

## 2016-04-26 ENCOUNTER — Ambulatory Visit: Payer: Medicare Other | Admitting: Psychology

## 2016-05-22 DIAGNOSIS — G4700 Insomnia, unspecified: Secondary | ICD-10-CM | POA: Diagnosis not present

## 2016-05-22 DIAGNOSIS — Z6825 Body mass index (BMI) 25.0-25.9, adult: Secondary | ICD-10-CM | POA: Diagnosis not present

## 2016-05-22 DIAGNOSIS — G603 Idiopathic progressive neuropathy: Secondary | ICD-10-CM | POA: Diagnosis not present

## 2016-05-30 ENCOUNTER — Encounter: Payer: Medicare Other | Admitting: Internal Medicine

## 2016-06-08 ENCOUNTER — Telehealth: Payer: Self-pay | Admitting: *Deleted

## 2016-06-08 NOTE — Telephone Encounter (Signed)
Scheduled for 06/28/16 @11 

## 2016-06-23 ENCOUNTER — Ambulatory Visit: Payer: Self-pay | Admitting: Family Medicine

## 2016-06-28 ENCOUNTER — Ambulatory Visit (INDEPENDENT_AMBULATORY_CARE_PROVIDER_SITE_OTHER): Payer: Medicare Other | Admitting: Internal Medicine

## 2016-06-28 ENCOUNTER — Encounter: Payer: Self-pay | Admitting: Internal Medicine

## 2016-06-28 VITALS — BP 124/76 | HR 51 | Temp 98.1°F | Resp 12 | Ht 65.0 in | Wt 151.0 lb

## 2016-06-28 DIAGNOSIS — G629 Polyneuropathy, unspecified: Secondary | ICD-10-CM | POA: Diagnosis not present

## 2016-06-28 DIAGNOSIS — Z23 Encounter for immunization: Secondary | ICD-10-CM | POA: Diagnosis not present

## 2016-06-28 DIAGNOSIS — D518 Other vitamin B12 deficiency anemias: Secondary | ICD-10-CM | POA: Diagnosis not present

## 2016-06-28 DIAGNOSIS — E785 Hyperlipidemia, unspecified: Secondary | ICD-10-CM

## 2016-06-28 DIAGNOSIS — Z1239 Encounter for other screening for malignant neoplasm of breast: Secondary | ICD-10-CM

## 2016-06-28 DIAGNOSIS — F329 Major depressive disorder, single episode, unspecified: Secondary | ICD-10-CM

## 2016-06-28 DIAGNOSIS — F32A Depression, unspecified: Secondary | ICD-10-CM

## 2016-06-28 DIAGNOSIS — E1140 Type 2 diabetes mellitus with diabetic neuropathy, unspecified: Secondary | ICD-10-CM | POA: Diagnosis not present

## 2016-06-28 DIAGNOSIS — Z0000 Encounter for general adult medical examination without abnormal findings: Secondary | ICD-10-CM | POA: Diagnosis not present

## 2016-06-28 LAB — CBC WITH DIFFERENTIAL/PLATELET
BASOS ABS: 0 10*3/uL (ref 0.0–0.1)
BASOS PCT: 0.4 % (ref 0.0–3.0)
EOS PCT: 1.9 % (ref 0.0–5.0)
Eosinophils Absolute: 0.1 10*3/uL (ref 0.0–0.7)
HEMATOCRIT: 39.5 % (ref 36.0–46.0)
Hemoglobin: 12.9 g/dL (ref 12.0–15.0)
LYMPHS PCT: 28.6 % (ref 12.0–46.0)
Lymphs Abs: 1.3 10*3/uL (ref 0.7–4.0)
MCHC: 32.5 g/dL (ref 30.0–36.0)
MCV: 82.5 fl (ref 78.0–100.0)
Monocytes Absolute: 0.5 10*3/uL (ref 0.1–1.0)
Monocytes Relative: 10.2 % (ref 3.0–12.0)
NEUTROS ABS: 2.8 10*3/uL (ref 1.4–7.7)
Neutrophils Relative %: 58.9 % (ref 43.0–77.0)
Platelets: 220 10*3/uL (ref 150.0–400.0)
RBC: 4.79 Mil/uL (ref 3.87–5.11)
RDW: 15.6 % — AB (ref 11.5–15.5)
WBC: 4.7 10*3/uL (ref 4.0–10.5)

## 2016-06-28 LAB — COMPREHENSIVE METABOLIC PANEL
ALBUMIN: 3.9 g/dL (ref 3.5–5.2)
ALK PHOS: 143 U/L — AB (ref 39–117)
ALT: 13 U/L (ref 0–35)
AST: 17 U/L (ref 0–37)
BILIRUBIN TOTAL: 0.6 mg/dL (ref 0.2–1.2)
BUN: 18 mg/dL (ref 6–23)
CALCIUM: 9.8 mg/dL (ref 8.4–10.5)
CO2: 28 mEq/L (ref 19–32)
CREATININE: 0.65 mg/dL (ref 0.40–1.20)
Chloride: 105 mEq/L (ref 96–112)
GFR: 92.59 mL/min (ref >60.00)
Glucose, Bld: 167 mg/dL — ABNORMAL HIGH (ref 70–99)
Potassium: 4.2 mEq/L (ref 3.5–5.1)
Sodium: 139 mEq/L (ref 135–145)
Total Protein: 6.1 g/dL (ref 6.0–8.3)

## 2016-06-28 LAB — LIPID PANEL
CHOLESTEROL: 145 mg/dL (ref 0–200)
HDL: 36.9 mg/dL — ABNORMAL LOW (ref >39.00)
LDL Cholesterol: 71 mg/dL (ref 0–99)
NonHDL: 107.71
Total CHOL/HDL Ratio: 4
Triglycerides: 186 mg/dL — ABNORMAL HIGH (ref 0.0–149.0)
VLDL: 37.2 mg/dL (ref 0.0–40.0)

## 2016-06-28 LAB — HEMOGLOBIN A1C: Hgb A1c MFr Bld: 6.5 % (ref 4.6–6.5)

## 2016-06-28 LAB — VITAMIN B12: Vitamin B-12: 493 pg/mL (ref 211–911)

## 2016-06-28 NOTE — Progress Notes (Signed)
Subjective:    Patient ID: Sierra Klein, female    DOB: 17-Aug-1931, 83 y.o.   MRN: [id number]  DOS:  06/28/2016 Type of visit - description : Routine checkup Interval history: DM: Diet control, doing well with lifestyle, check labs Depression: See the last visit, back on Cymbalta, feeling better Neuropathy, narcolepsy: Symptoms controlled, note from neurology reviewed High cholesterol: Diet control, due for labs  Review of Systems Constitutional: No fever. No chills. No unexplained wt changes. No unusual sweats  HEENT:   Had a URI a few weeks ago, symptoms completely resolved No dental problems, no ear discharge, no facial swelling, no voice changes. No eye discharge, no eye  redness , no  intolerance to light   Respiratory: No wheezing , no  difficulty breathing. No cough , no mucus production  Cardiovascular: No CP, occasional L>R ankle swelling at the end of the day  GI: no nausea, no vomiting, no diarrhea , no  abdominal pain.  No blood in the stools. No dysphagia, no odynophagia    Endocrine: No polyphagia, no polyuria , no polydipsia  GU: No dysuria, gross hematuria, difficulty urinating. No urinary urgency, no frequency.  Musculoskeletal: No joint swellings or unusual aches or pains  Skin: No change in the color of the skin, palor , no  Rash  Allergic, immunologic: No environmental allergies , no  food allergies  Neurological: No dizziness no  syncope. No headaches. No diplopia, no slurred, no slurred speech, no motor deficits, no facial  Numbness  Hematological: No enlarged lymph nodes, no easy bruising , no unusual bleedings  Psychiatry: No suicidal ideas, no hallucinations, no beavior problems, no confusion.  No unusual/severe anxiety, no depression   Past Medical History:  Diagnosis Date  . B12 deficiency anemia   . Cholelithiasis   . Colon polyps   . Diabetes mellitus   . Disturbance, sleep    narcolepsy  . Diverticulitis   . Hiatal hernia   .  Hyperlipidemia    started med 6/09  . Neuropathy ([place]) 1998   not releated to DM w/u neg (idopatic progressive neuropathy)= f/u [facility]  . Osteoarthritis     Past Surgical History:  Procedure Laterality Date  . ABDOMINAL HYSTERECTOMY     no oophorectomy  . BELPHAROPTOSIS REPAIR    . BREAST BIOPSY  09/1983   left  . CATARACT EXTRACTION     l- 9/09, r- 3/10  . CLOSED REDUCTION METACARPAL WITH PERCUTANEOUS PINNING Right 01/12/2015   Procedure: PINNING RIGHT 4 & 5 METACARPAL FRACTURES;  Surgeon: Milly Jakob, MD;  Location: [facility];  Service: Orthopedics;  Laterality: Right;  . FOOT SURGERY  03/1988   cyst removed from right heel  . LAPAROSCOPIC LYSIS OF ADHESIONS  2013    RLQ pain resolved after surgery  . pelvic surgery  2003   cysto-recto-enetero cele repair     Social History   Social History  . Marital status: Widowed    Spouse name: N/A  . Number of children: 2  . Years of education: N/A   Occupational History  . retired Retired   Social History Main Topics  . Smoking status: Former Smoker    Types: Cigarettes    Quit date: 06/28/1954  . Smokeless tobacco: Never Used  . Alcohol use 2.4 oz/week    4 Glasses of wine per week     Comment: 3-4x/week  . Drug use: No  . Sexual activity: No   Other Topics Concern  .  Not on file   Social History Narrative   Married x 61 years, lost husband to a pulm emboli 02-2015    still drives   Children live @  [place] [facility] [place]          Allergies as of 06/28/2016      Reactions   Sulfonamide Derivatives Hives      Medication List       Accurate as of 06/28/16 11:59 PM. Always use your most recent med list.          AMBIEN 10 MG tablet Generic drug:  zolpidem Take 5 mg by mouth at bedtime as needed.   aspirin 325 MG tablet Take 325 mg by mouth daily.   calcium carbonate 1250 MG capsule Take 1,250 mg by mouth 2 (two) times daily with a meal.   cyanocobalamin 1000 MCG tablet Take  100 mcg by mouth daily.   diclofenac sodium 1 % Gel Commonly known as:  VOLTAREN Apply 2 g topically as needed.   diphenoxylate-atropine 2.5-0.025 MG tablet Commonly known as:  LOMOTIL Take 1 tablet by mouth 3 (three) times daily as needed for diarrhea or loose stools.   DULoxetine 60 MG capsule Commonly known as:  CYMBALTA Take 60 mg by mouth 2 (two) times daily.   gabapentin 600 MG tablet Commonly known as:  NEURONTIN Take 600 mg by mouth 4 (four) times daily.   multivitamin per tablet Take 1 tablet by mouth daily.   NORCO 5-325 MG tablet Generic drug:  HYDROcodone-acetaminophen Take by mouth.   NUVIGIL 150 MG tablet Generic drug:  Armodafinil Take 150 mg by mouth daily as needed.   timolol 0.5 % ophthalmic solution Commonly known as:  BETIMOL Place 1 drop into both eyes daily.   VITAMIN D-3 PO Take 1 capsule by mouth daily.          Objective:   Physical Exam BP 124/76 (BP Location: Left Arm, Patient Position: Sitting, Cuff Size: Small)   Pulse (!) 51   Temp 98.1 F (36.7 C) (Oral)   Resp 12   Ht 5\' 5"  (1.651 m)   Wt 151 lb (68.5 kg)   SpO2 93%   BMI 25.13 kg/m   General:   Well developed, well nourished . NAD.  Neck: No  thyromegaly  HEENT:  Normocephalic . Face symmetric, atraumatic Lungs:  CTA B Normal respiratory effort, no intercostal retractions, no accessory muscle use. Heart: RRR,  no murmur.  No pretibial edema bilaterally  Abdomen:  Not distended, soft, non-tender. No rebound or rigidity.   Skin: Exposed areas without rash. Not pale. Not jaundice Neurologic:  alert & oriented X3.  Speech normal, gait appropriate for age and unassisted Strength symmetric and appropriate for age.  Psych: Cognition and judgment appear intact.  Cooperative with normal attention span and concentration.  Behavior appropriate. No anxious or depressed appearing.    Assessment & Plan:   Assessment DM Hyperlipidemia Depression b 12  deficiency Neuro: -H/o vertigo -Narcolepsy Dr Bertrum Sol -Neuropathy, 1998, not related to DM, workup negative, idiopathic. Dr Jannifer [facility]; hydrocodone per Dr Jannifer Franklin Chronic L ankle edema DJD   Wrist Fx 12-2014 DEXA 12-07 (-) and 7-10 normal; T score -1.8 (07-2015) GI:   HH; colon polyps;  Diverticulitis;  cholelithiasis per CT 2012  PLAN: DM: Diet control, last A1c increased from previous baseline, check a CMP, A1c High cholesterol: diet control, labs Depression: She is back on Cymbalta: Feeling better. Last alkaline phosphatase was slightly elevated: Checking  labs Neuropathy: Saw neurology 05/22/2016, note reviewed, stable B12 deficiency: Labs To have an Medicare wellness today RTC 6 months

## 2016-06-28 NOTE — Progress Notes (Signed)
Subjective:   Sierra Klein is a 83 y.o. female who presents for Medicare Annual (Subsequent) preventive examination.  Review of Systems:  No ROS.  Medicare Wellness Visit.  Cardiac Risk Factors include: diabetes mellitus;dyslipidemia;advanced age (>29men, >58 women)  Sleep patterns: no sleep issues, feels rested on waking, does not get up to void and 9 hours nightly. Takes Ambien most nights.  Home Safety/Smoke Alarms: Feels safe in home. Smoke alarms in place.  Living environment; residence and Firearm Safety: Lives alone in [place], can live on one level, firearms stored safely. Seat Belt Safety/Bike Helmet: Wears seat belt.   Counseling:   Eye Exam- Dr. Kathrin Penner. Upcoming appt in February.  Dental- Dr. Jone Baseman every year. Partial plate lower.  Female:   Pap- N/A due to age      37- last 08/24/15. BI-RADS CATEGORY  2: Benign. Dexa scan- last 08/24/15. Osteopenia. Pt currently on OTC calcium + vitamin D supplements. 2-3 year follow-up per PCP.       CCS- Aged out.     Objective:     Vitals: BP 124/76 (BP Location: Left Arm, Patient Position: Sitting, Cuff Size: Small)   Pulse (!) 51   Temp 98.1 F (36.7 C) (Oral)   Resp 12   Ht 5\' 5"  (1.651 m)   Wt 151 lb (68.5 kg)   SpO2 93%   BMI 25.13 kg/m   Body mass index is 25.13 kg/m.   Tobacco History  Smoking Status  . Former Smoker  . Types: Cigarettes  . Quit date: 06/28/1954  Smokeless Tobacco  . Never Used     Counseling given: No   Past Medical History:  Diagnosis Date  . B12 deficiency anemia   . Cholelithiasis   . Colon polyps   . Diabetes mellitus   . Disturbance, sleep    narcolepsy  . Diverticulitis   . Hiatal hernia   . Hyperlipidemia    started med 6/09  . Neuropathy ([place]) 1998   not releated to DM w/u neg (idopatic progressive neuropathy)= f/u [facility]  . Osteoarthritis    Past Surgical History:  Procedure Laterality Date  . ABDOMINAL HYSTERECTOMY     no oophorectomy   . BELPHAROPTOSIS REPAIR    . BREAST BIOPSY  09/1983   left  . CATARACT EXTRACTION     l- 9/09, r- 3/10  . CLOSED REDUCTION METACARPAL WITH PERCUTANEOUS PINNING Right 01/12/2015   Procedure: PINNING RIGHT 4 & 5 METACARPAL FRACTURES;  Surgeon: Milly Jakob, MD;  Location: [facility];  Service: Orthopedics;  Laterality: Right;  . FOOT SURGERY  03/1988   cyst removed from right heel  . LAPAROSCOPIC LYSIS OF ADHESIONS  2013    RLQ pain resolved after surgery  . pelvic surgery  2003   cysto-recto-enetero cele repair    Family History  Problem Relation Age of Onset  . Coronary artery disease Sister   . Heart disease Sister   . Coronary artery disease Mother     tachycardia  . Alzheimer's disease Mother   . Coronary artery disease Brother     x 2 brothers  . Hypertension Brother     x 2 brothers  . Hyperlipidemia Brother   . Hypertension Brother   . Diabetes      numerous cousins  . Colon cancer Neg Hx   . Breast cancer Neg Hx    History  Sexual Activity  . Sexual activity: No    Outpatient Encounter Prescriptions as of  06/28/2016  Medication Sig  . Armodafinil (NUVIGIL) 150 MG tablet Take 150 mg by mouth daily as needed.  Marland Kitchen aspirin 325 MG tablet Take 325 mg by mouth daily.    . calcium carbonate 1250 MG capsule Take 1,250 mg by mouth 2 (two) times daily with a meal.  . Cholecalciferol (VITAMIN D-3 PO) Take 1 capsule by mouth daily.    . cyanocobalamin 1000 MCG tablet Take 100 mcg by mouth daily.    . diclofenac sodium (VOLTAREN) 1 % GEL Apply 2 g topically as needed.  . diphenoxylate-atropine (LOMOTIL) 2.5-0.025 MG per tablet Take 1 tablet by mouth 3 (three) times daily as needed for diarrhea or loose stools.  . DULoxetine (CYMBALTA) 60 MG capsule Take 60 mg by mouth 2 (two) times daily.  Marland Kitchen gabapentin (NEURONTIN) 600 MG tablet Take 600 mg by mouth 4 (four) times daily.    Marland Kitchen HYDROcodone-acetaminophen (NORCO) 5-325 MG per tablet Take by mouth.  . multivitamin  (THERAGRAN) per tablet Take 1 tablet by mouth daily.    . timolol (BETIMOL) 0.5 % ophthalmic solution Place 1 drop into both eyes daily.  Marland Kitchen zolpidem (AMBIEN) 10 MG tablet Take 5 mg by mouth at bedtime as needed.     No facility-administered encounter medications on file as of 06/28/2016.     Activities of Daily Living In your present state of health, do you have any difficulty performing the following activities: 06/28/2016 11/30/2015  Hearing? N N  Vision? N N  Difficulty concentrating or making decisions? N N  Walking or climbing stairs? N N  Dressing or bathing? N N  Doing errands, shopping? N N  Preparing Food and eating ? N -  Using the Toilet? N -  In the past six months, have you accidently leaked urine? Y -  Do you have problems with loss of bowel control? N -  Managing your Medications? N -  Managing your Finances? N -  Housekeeping or managing your Housekeeping? N -  Some recent data might be hidden    Patient Care Team: Colon Branch, MD as PCP - General Irene Shipper, MD as Consulting Physician (Gastroenterology) Clide Cliff. Jannifer Franklin, MD as Referring Physician (Neurology) Shon Hough, MD as Consulting Physician (Ophthalmology)    Assessment:    Physical assessment deferred to PCP.  Exercise Activities and Dietary recommendations Current Exercise Habits: The patient does not participate in regular exercise at present  Diet (meal preparation, eat out, water intake, caffeinated beverages, dairy products, fruits and vegetables): in general, a "healthy" diet  , well balanced, on average, 2 meals per day. Meat, vegetable, and salad at most meals. Prepares own meals. Eats mostly at home. Coffee in the mornings and then lots of water throughout the day.  Goals    . Patient Stated          Continue to eat heart healthy diet (full of fruits, vegetables, whole grains, lean protein, water--limit salt, fat, and sugar intake) and increase physical activity as tolerated. Continue doing  brain stimulating activities (puzzles, reading, adult coloring books, staying active) to keep memory sharp.        Fall Risk Fall Risk  06/28/2016 11/30/2015 01/05/2015 05/19/2014 05/13/2013  Falls in the past year? No No Yes No Yes  Number falls in past yr: - - 2 or more - 1  Injury with Fall? - - Yes - Yes  Follow up - - Falls evaluation completed - -   Depression Screen PHQ 2/9 Scores 06/28/2016  11/30/2015 01/05/2015 05/19/2014  PHQ - 2 Score 0 1 0 0     Cognitive Function MMSE - Mini Mental State Exam 06/28/2016  Orientation to time 5  Orientation to Place 5  Registration 3  Attention/ Calculation 5  Recall 3  Language- name 2 objects 2  Language- repeat 1  Language- follow 3 step command 3  Language- read & follow direction 1  Write a sentence 1  Copy design 1  Total score 30        Immunization History  Administered Date(s) Administered  . Influenza Split 04/10/2011, 04/18/2012  . Influenza Whole 05/03/2007, 04/22/2008, 04/14/2009, 04/13/2010  . Influenza, High Dose Seasonal PF 05/13/2013, 05/25/2015, 03/14/2016  . Influenza,inj,Quad PF,36+ Mos 03/27/2014  . Pneumococcal Conjugate-13 02/11/2014  . Pneumococcal Polysaccharide-23 04/26/2006  . Td 04/26/2006, 06/28/2016  . Zoster 10/09/2006   Screening Tests Health Maintenance  Topic Date Due  . OPHTHALMOLOGY EXAM  12/17/2015  . FOOT EXAM  04/26/2016  . HEMOGLOBIN A1C  09/11/2016  . URINE MICROALBUMIN  03/14/2017  . TETANUS/TDAP  06/28/2026  . INFLUENZA VACCINE  Completed  . DEXA SCAN  Completed  . ZOSTAVAX  Completed  . PNA vac Low Risk Adult  Completed      Plan:    Bring a copy of your advance directives to your next office visit. Please have Dr. Kathrin Penner send [place] a copy of your eye exam. Follow-up w/ PCP as scheduled. MMG due February-future orders placed.  During the course of the visit the patient was educated and counseled about the following appropriate screening and preventive services:   Vaccines  to include Pneumoccal, Influenza, Hepatitis B, Td, Zostavax, HCV  Cardiovascular Disease  Bone density screening  Diabetes screening  Glaucoma screening  Mammography/PAP  Nutrition counseling   Patient Instructions (the written plan) was given to the patient.   Dorrene German, RN  06/28/2016   Kathlene November, MD

## 2016-06-28 NOTE — Patient Instructions (Addendum)
See our nurse for Medicare wellness   GO TO THE LAB : Get the blood work     [name] Schedule your next appointment for a routine checkup in 6 months   Bring a copy of your advance directives to your next office visit. Please have Dr. Kathrin Penner send [place] a copy of your eye exam.   Sierra Klein , Thank you for taking time to come for your Medicare Wellness Visit. I appreciate your ongoing commitment to your health goals. Please review the following plan we discussed and let me know if I can assist you in the future.   These are the goals we discussed: Goals    . Patient Stated          Continue to eat heart healthy diet (full of fruits, vegetables, whole grains, lean protein, water--limit salt, fat, and sugar intake) and increase physical activity as tolerated. Continue doing brain stimulating activities (puzzles, reading, adult coloring books, staying active) to keep memory sharp.         This is a list of the screening recommended for you and due dates:  Health Maintenance  Topic Date Due  . Complete foot exam   10/25/1941  . Eye exam for diabetics  12/17/2015  . Hemoglobin A1C  09/11/2016  . Urine Protein Check  03/14/2017  . Tetanus Vaccine  06/28/2026  . Flu Shot  Completed  . DEXA scan (bone density measurement)  Completed  . Shingles Vaccine  Completed  . Pneumonia vaccines  Completed

## 2016-06-28 NOTE — Assessment & Plan Note (Addendum)
Have an Medicare wellness today. Taking aspirin 325 mg daily, decrease to 81 mg? Patient quite reluctant, she feels better in general when she takes a higher dose of aspirin, has no side effects.

## 2016-06-28 NOTE — Progress Notes (Signed)
Pre visit review using our clinic review tool, if applicable. No additional management support is needed unless otherwise documented below in the visit note. 

## 2016-06-29 NOTE — Assessment & Plan Note (Signed)
DM: Diet control, last A1c increased from previous baseline, check a CMP, A1c High cholesterol: diet control, labs Depression: She is back on Cymbalta: Feeling better. Last alkaline phosphatase was slightly elevated: Checking labs Neuropathy: Saw neurology 05/22/2016, note reviewed, stable B12 deficiency: Labs To have an Medicare wellness today RTC 6 months

## 2016-07-20 ENCOUNTER — Other Ambulatory Visit: Payer: Self-pay | Admitting: Internal Medicine

## 2016-07-20 DIAGNOSIS — Z1231 Encounter for screening mammogram for malignant neoplasm of breast: Secondary | ICD-10-CM

## 2016-08-10 DIAGNOSIS — H35371 Puckering of macula, right eye: Secondary | ICD-10-CM | POA: Diagnosis not present

## 2016-08-10 DIAGNOSIS — Z961 Presence of intraocular lens: Secondary | ICD-10-CM | POA: Diagnosis not present

## 2016-08-10 DIAGNOSIS — H40053 Ocular hypertension, bilateral: Secondary | ICD-10-CM | POA: Diagnosis not present

## 2016-08-10 DIAGNOSIS — E119 Type 2 diabetes mellitus without complications: Secondary | ICD-10-CM | POA: Diagnosis not present

## 2016-08-10 LAB — HM DIABETES EYE EXAM

## 2016-08-12 IMAGING — CR DG ORBITS COMPLETE 4+V
3 series · 3 of 3 positions shown · non-contrast
Comparison: None.

CLINICAL DATA: Bruising around right eye after fall.

EXAM:
ORBITS - COMPLETE 4+ VIEW

[[person_name] *]
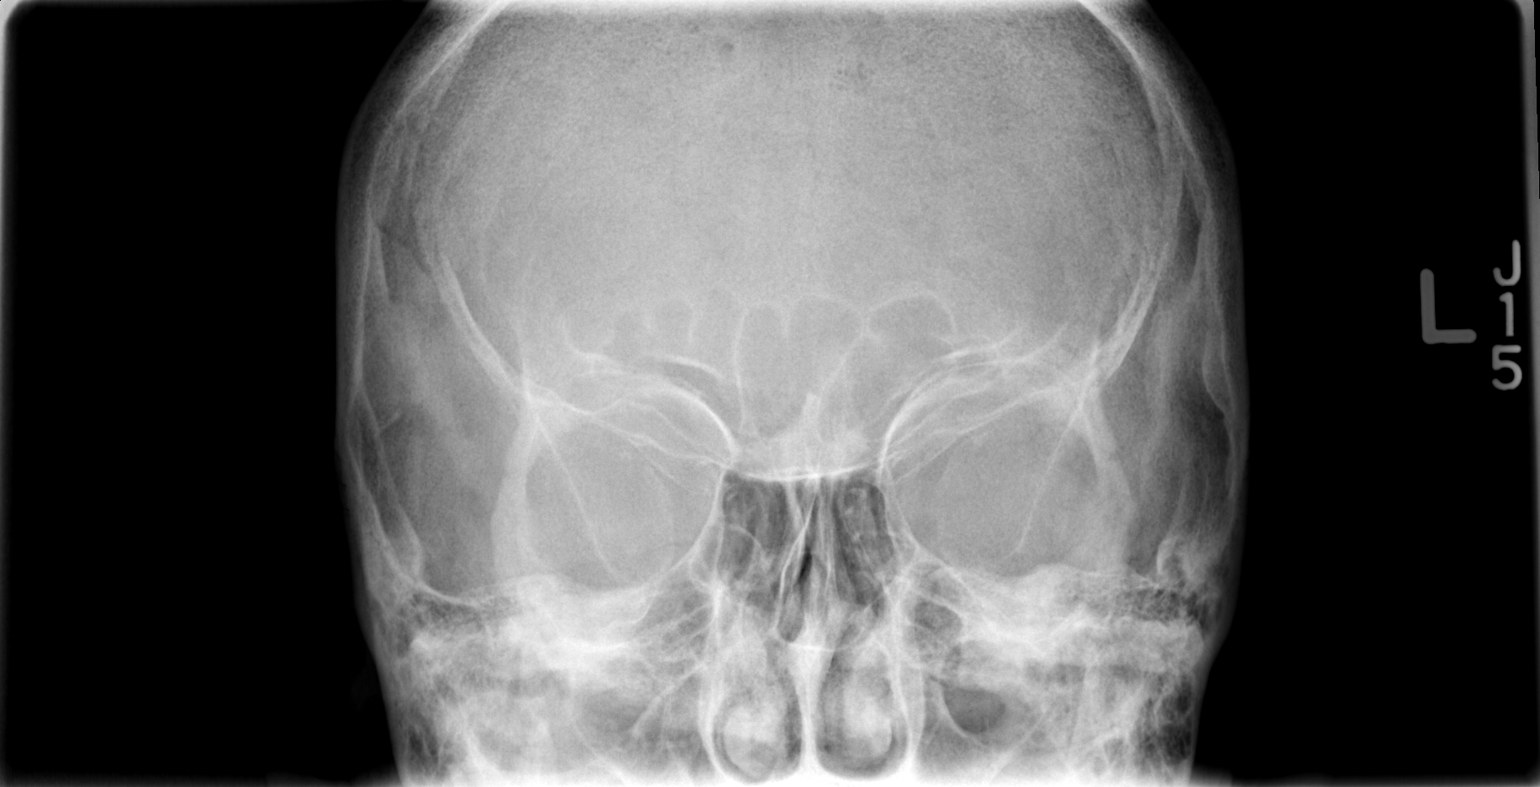

[w waters *]
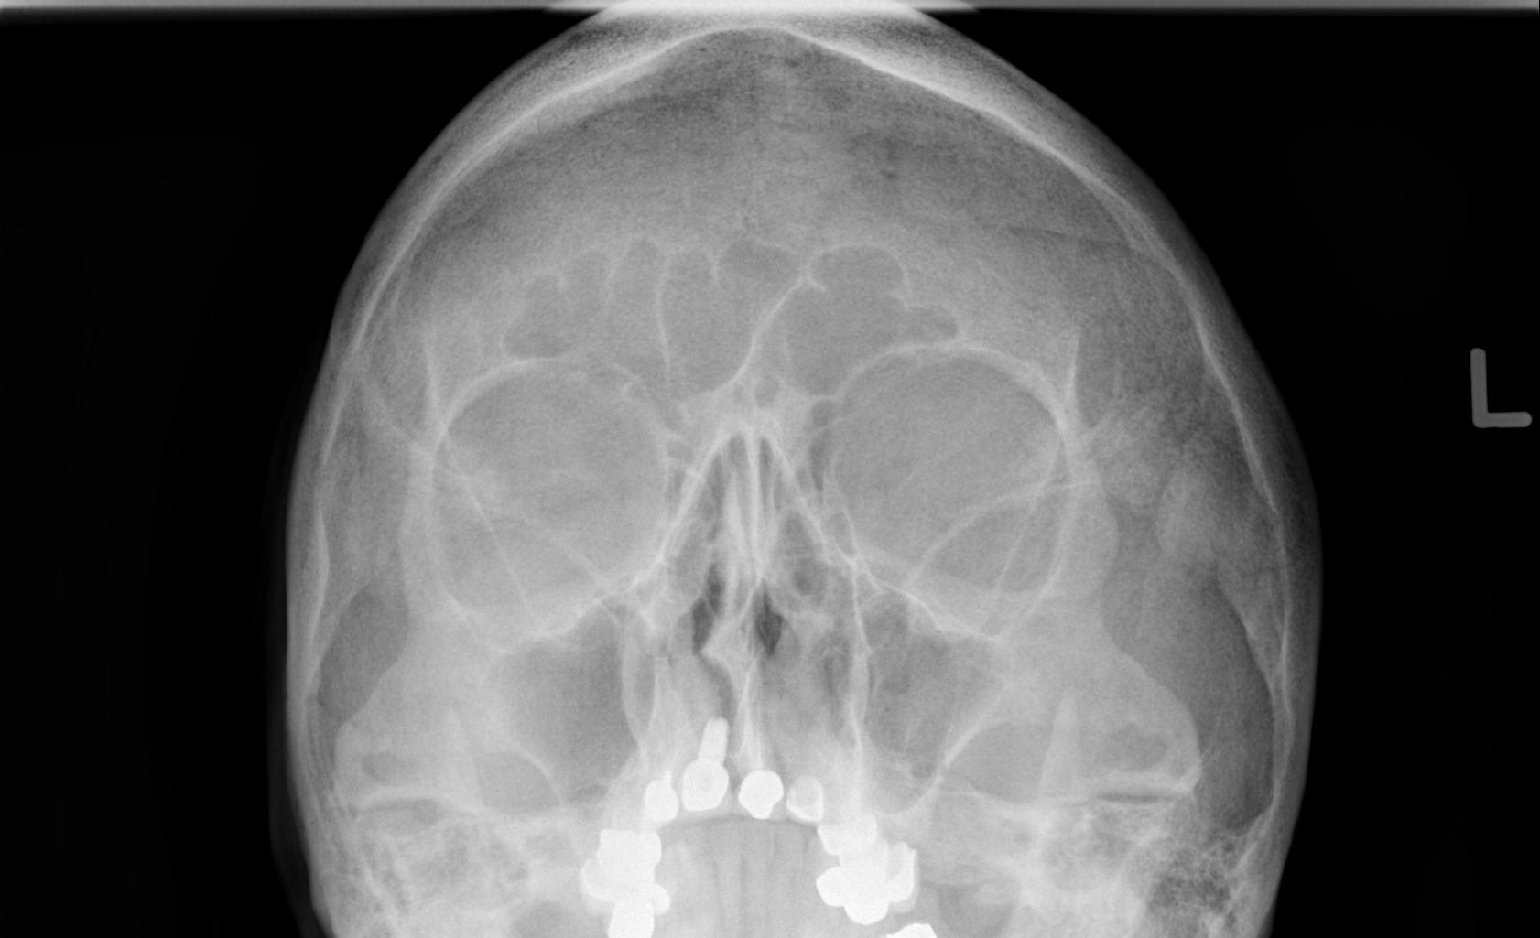

[w skull lat]
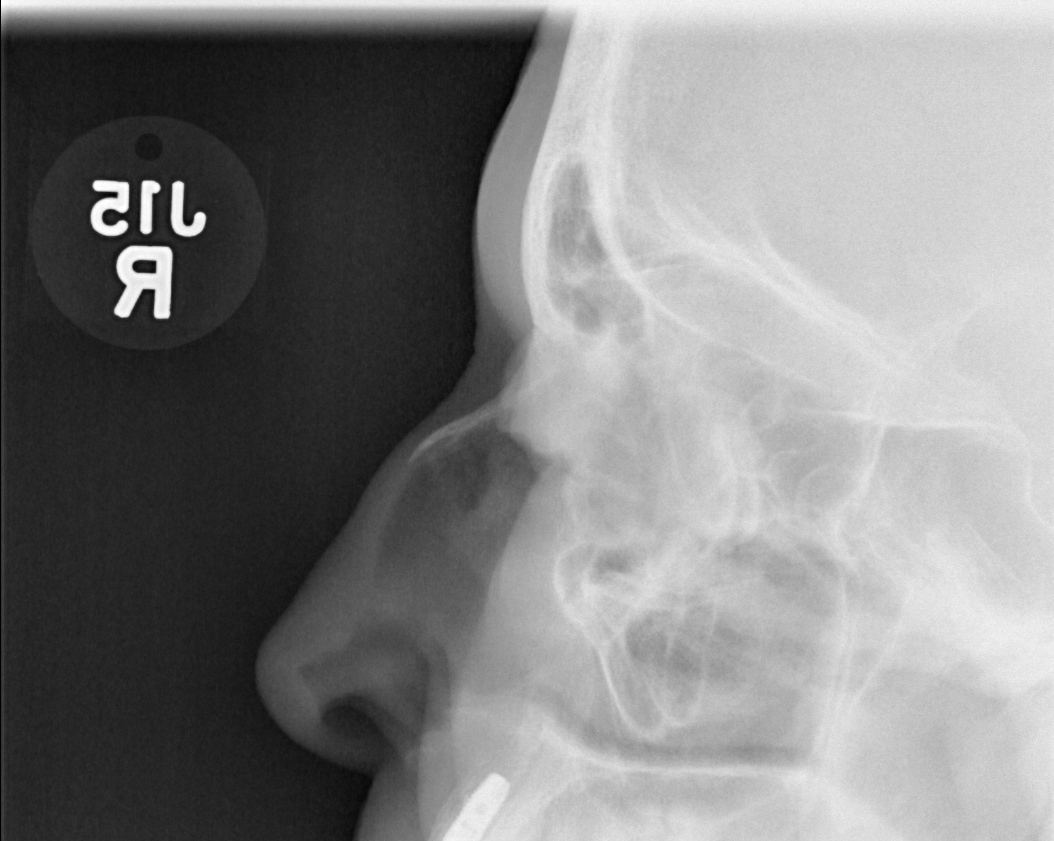

[3 of 3 positions shown; findings below may reference images not displayed]

FINDINGS: There is no evidence of fracture or other significant bone
abnormality. No orbital emphysema or sinus air-fluid levels are
seen.
IMPRESSION: Negative.

## 2016-08-16 ENCOUNTER — Encounter: Payer: Self-pay | Admitting: Internal Medicine

## 2016-08-24 ENCOUNTER — Ambulatory Visit
Admission: RE | Admit: 2016-08-24 | Discharge: 2016-08-24 | Disposition: A | Discharge status: Home / Self Care | Payer: Medicare Other | Source: Ambulatory Visit | Attending: Internal Medicine | Admitting: Internal Medicine

## 2016-08-24 DIAGNOSIS — Z1231 Encounter for screening mammogram for malignant neoplasm of breast: Secondary | ICD-10-CM | POA: Diagnosis not present

## 2016-11-21 DIAGNOSIS — Z9181 History of falling: Secondary | ICD-10-CM | POA: Diagnosis not present

## 2016-11-21 DIAGNOSIS — Z6824 Body mass index (BMI) 24.0-24.9, adult: Secondary | ICD-10-CM | POA: Diagnosis not present

## 2016-11-21 DIAGNOSIS — F329 Major depressive disorder, single episode, unspecified: Secondary | ICD-10-CM | POA: Diagnosis not present

## 2016-11-21 DIAGNOSIS — G4719 Other hypersomnia: Secondary | ICD-10-CM | POA: Diagnosis not present

## 2016-11-21 DIAGNOSIS — G603 Idiopathic progressive neuropathy: Secondary | ICD-10-CM | POA: Diagnosis not present

## 2017-01-02 ENCOUNTER — Ambulatory Visit: Payer: Self-pay | Admitting: Internal Medicine

## 2017-01-09 ENCOUNTER — Ambulatory Visit (INDEPENDENT_AMBULATORY_CARE_PROVIDER_SITE_OTHER): Payer: Medicare Other | Admitting: Internal Medicine

## 2017-01-09 ENCOUNTER — Encounter: Payer: Self-pay | Admitting: Internal Medicine

## 2017-01-09 VITALS — BP 126/62 | HR 69 | Temp 97.5°F | Resp 14 | Ht 65.0 in | Wt 146.0 lb

## 2017-01-09 DIAGNOSIS — F329 Major depressive disorder, single episode, unspecified: Secondary | ICD-10-CM | POA: Diagnosis not present

## 2017-01-09 DIAGNOSIS — E1140 Type 2 diabetes mellitus with diabetic neuropathy, unspecified: Secondary | ICD-10-CM | POA: Diagnosis not present

## 2017-01-09 DIAGNOSIS — D518 Other vitamin B12 deficiency anemias: Secondary | ICD-10-CM

## 2017-01-09 DIAGNOSIS — F32A Depression, unspecified: Secondary | ICD-10-CM

## 2017-01-09 LAB — HEMOGLOBIN A1C: Hgb A1c MFr Bld: 6.3 % (ref 4.6–6.5)

## 2017-01-09 NOTE — Assessment & Plan Note (Signed)
DM, diet control, check A1c Hyperlipidemia: FLP 06-2016 excellent. Depression and insomnia: still has occasional difficulty with sleep, on Ambien 5 mg as needed, occasionally gets a "hung over" from [place] but declined to change B 12 deficiency: Last lab satisfactory A.P. slightly elevated: Last level decreased, consider a repeat AP and also a GGT in the future RTC 06-2017, routine checkup

## 2017-01-09 NOTE — Progress Notes (Signed)
Subjective:    Patient ID: Sierra Klein, female    DOB: 31-Dec-1931, 83 y.o.   MRN: [id number]  DOS:  01/09/2017 Type of visit - description : rov Interval history: No major concerns. She feels about the same. Still has occasional dizziness, not associated stroke symptoms such as slurred speech or motor deficits Still occasional difficulty with insomnia.   Review of Systems Denies chest pain or difficulty breathing No nausea, vomiting, diarrhea  Past Medical History:  Diagnosis Date  . B12 deficiency anemia   . Cholelithiasis   . Colon polyps   . Diabetes mellitus   . Disturbance, sleep    narcolepsy  . Diverticulitis   . Hiatal hernia   . Hyperlipidemia    started med 6/09  . 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 Osteoarthritis     Past Surgical History:  Procedure Laterality Date  . ABDOMINAL HYSTERECTOMY     no oophorectomy  . BELPHAROPTOSIS REPAIR    . BREAST BIOPSY  09/1983   left  . CATARACT EXTRACTION     l- 9/09, r- 3/10  . CLOSED REDUCTION METACARPAL WITH PERCUTANEOUS PINNING Right 01/12/2015   Procedure: PINNING RIGHT 4 & 5 METACARPAL FRACTURES;  Surgeon: Milly Jakob, MD;  Location: [facility];  Service: Orthopedics;  Laterality: Right;  . FOOT SURGERY  03/1988   cyst removed from right heel  . LAPAROSCOPIC LYSIS OF ADHESIONS  2013    RLQ pain resolved after surgery  . pelvic surgery  2003   cysto-recto-enetero cele repair     Social History   Social History  . Marital status: Widowed    Spouse name: N/A  . Number of children: 2  . Years of education: N/A   Occupational History  . retired Retired   Social History Main Topics  . Smoking status: Former Smoker    Types: Cigarettes    Quit date: 06/28/1954  . Smokeless tobacco: Never Used  . Alcohol use 2.4 oz/week    4 Glasses of wine per week     Comment: 3-4x/week  . Drug use: No  . Sexual activity: No    Other Topics Concern  . Not on file   Social History Narrative   Married x 61 years, lost husband to a pulm emboli 02-2015    still drives   Children live @  [place] and [place]          Allergies as of 01/09/2017      Reactions   Sulfonamide Derivatives Hives      Medication List       Accurate as of 01/09/17  8:41 PM. Always use your most recent med list.          AMBIEN 10 MG tablet Generic drug:  zolpidem Take 5 mg by mouth at bedtime as needed.   aspirin EC 81 MG tablet Take 81 mg by mouth daily.   calcium carbonate 1250 MG capsule Take 1,250 mg by mouth 2 (two) times daily with a meal.   cyanocobalamin 1000 MCG tablet Take 100 mcg by mouth daily.   diclofenac sodium 1 % Gel Commonly known as:  VOLTAREN Apply 2 g topically as needed.   diphenoxylate-atropine 2.5-0.025 MG tablet Commonly known as:  LOMOTIL Take 1 tablet by mouth 3 (three) times daily as needed for diarrhea or loose stools.   DULoxetine 60 MG capsule Commonly known as:  CYMBALTA Take 60  mg by mouth 2 (two) times daily.   gabapentin 600 MG tablet Commonly known as:  NEURONTIN Take 600 mg by mouth 4 (four) times daily.   multivitamin per tablet Take 1 tablet by mouth daily.   NORCO 5-325 MG tablet Generic drug:  HYDROcodone-acetaminophen Take by mouth.   NUVIGIL 150 MG tablet Generic drug:  Armodafinil Take 150 mg by mouth daily as needed.   timolol 0.5 % ophthalmic solution Commonly known as:  BETIMOL Place 1 drop into both eyes daily.   VITAMIN D-3 PO Take 1 capsule by mouth daily.          Objective:   Physical Exam BP 126/62 (BP Location: Left Arm, Patient Position: Sitting, Cuff Size: Small)   Pulse 69   Temp (!) 97.5 F (36.4 C) (Oral)   Resp 14   Ht 5\' 5"  (1.651 m)   Wt 146 lb (66.2 kg)   SpO2 98%   BMI 24.30 kg/m  General:   Well developed, well nourished . NAD.  HEENT:  Normocephalic . Face symmetric, atraumatic Lungs:  CTA B Normal respiratory  effort, no intercostal retractions, no accessory muscle use. Heart: RRR,  no murmur.  + Left ankle edema, seems at baseline   Skin: Not pale. Not jaundice Neurologic:  alert & oriented X3.  Speech normal, gait appropriate for age and unassisted Psych--  Cognition and judgment appear intact.  Cooperative with normal attention span and concentration.  Behavior appropriate. No anxious or depressed appearing.      Assessment & Plan:    Assessment DM Hyperlipidemia Depression, insomnia b 12 deficiency Neuro: -H/o vertigo -Narcolepsy Dr Bertrum Sol -Neuropathy, 1998, not related to DM, workup negative, idiopathic. Dr Jannifer Franklin [facility]; hydrocodone per Dr Jannifer Franklin Chronic L ankle edema DJD   Wrist Fx 12-2014 DEXA 12-07 (-) and 7-10 normal; T score -1.8 (07-2015) GI:   HH; colon polyps;  Diverticulitis;  cholelithiasis per CT 2012  PLAN: DM, diet control, check A1c Hyperlipidemia: FLP 06-2016 excellent. Depression and insomnia: still has occasional difficulty with sleep, on Ambien 5 mg as needed, occasionally gets a "hung over" from [place] but declined to change B 12 deficiency: Last lab satisfactory A.P. slightly elevated: Last level decreased, consider a repeat AP and also a GGT in the future RTC 06-2017, routine checkup

## 2017-01-09 NOTE — Patient Instructions (Signed)
GO TO THE LAB : Get the blood work     GO TO THE FRONT DESK Schedule your next appointment for a  routine checkup by 1- 2019,    Also schedule a Medicare wellness visit at that time.

## 2017-01-09 NOTE — Progress Notes (Signed)
Pre visit review using our clinic review tool, if applicable. No additional management support is needed unless otherwise documented below in the visit note. 

## 2017-02-08 DIAGNOSIS — H02051 Trichiasis without entropian right upper eyelid: Secondary | ICD-10-CM | POA: Diagnosis not present

## 2017-02-08 DIAGNOSIS — H40053 Ocular hypertension, bilateral: Secondary | ICD-10-CM | POA: Diagnosis not present

## 2017-02-08 DIAGNOSIS — H02054 Trichiasis without entropian left upper eyelid: Secondary | ICD-10-CM | POA: Diagnosis not present

## 2017-05-08 DIAGNOSIS — Z23 Encounter for immunization: Secondary | ICD-10-CM | POA: Diagnosis not present

## 2017-07-10 ENCOUNTER — Encounter: Payer: Self-pay | Admitting: Internal Medicine

## 2017-07-10 ENCOUNTER — Ambulatory Visit (INDEPENDENT_AMBULATORY_CARE_PROVIDER_SITE_OTHER): Payer: Medicare Other | Admitting: Internal Medicine

## 2017-07-10 VITALS — BP 124/72 | HR 58 | Temp 98.1°F | Resp 14 | Ht 65.0 in | Wt 144.1 lb

## 2017-07-10 DIAGNOSIS — G629 Polyneuropathy, unspecified: Secondary | ICD-10-CM | POA: Diagnosis not present

## 2017-07-10 DIAGNOSIS — D518 Other vitamin B12 deficiency anemias: Secondary | ICD-10-CM | POA: Diagnosis not present

## 2017-07-10 DIAGNOSIS — M899 Disorder of bone, unspecified: Secondary | ICD-10-CM | POA: Diagnosis not present

## 2017-07-10 DIAGNOSIS — E1140 Type 2 diabetes mellitus with diabetic neuropathy, unspecified: Secondary | ICD-10-CM

## 2017-07-10 DIAGNOSIS — E785 Hyperlipidemia, unspecified: Secondary | ICD-10-CM

## 2017-07-10 DIAGNOSIS — F32A Depression, unspecified: Secondary | ICD-10-CM

## 2017-07-10 DIAGNOSIS — R945 Abnormal results of liver function studies: Secondary | ICD-10-CM | POA: Diagnosis not present

## 2017-07-10 DIAGNOSIS — Z23 Encounter for immunization: Secondary | ICD-10-CM

## 2017-07-10 DIAGNOSIS — R7989 Other specified abnormal findings of blood chemistry: Secondary | ICD-10-CM

## 2017-07-10 DIAGNOSIS — F329 Major depressive disorder, single episode, unspecified: Secondary | ICD-10-CM

## 2017-07-10 LAB — MICROALBUMIN / CREATININE URINE RATIO
Creatinine,U: 44.4 mg/dL
MICROALB/CREAT RATIO: 1.6 mg/g (ref 0.0–30.0)
Microalb, Ur: <0.7 mg/dL (ref 0.0–1.9)

## 2017-07-10 MED ORDER — DIPHENOXYLATE-ATROPINE 2.5-0.025 MG PO TABS: 1.0000 | ORAL_TABLET | Freq: Three times a day (TID) | ORAL | Status: DC | PRN

## 2017-07-10 NOTE — Patient Instructions (Signed)
GO TO THE LAB : Get the blood work     GO TO THE FRONT DESK Schedule your next appointment for a   checkup in 6 months  Please consider see one  of our nurses for a Medicare wellness

## 2017-07-10 NOTE — Assessment & Plan Note (Signed)
DM: Diet controlled, check A1c, micro and CMP.  Feet exam consistent with neuropathy, feet care discussed. Hyperlipidemia: Diet controlled, check FLP Depression, insomnia: Controlled on Cymbalta, Ambien. Narcolepsy, neuropathy: On Nuvigil and hydrocodone rx per neuro.  Used to see Dr. Jannifer Franklin, neurology, but her practice closed.  Will call next month if she needs to get establish w/ a new neurologist. B12 deficiency: on oral supplements, last B12 satisfactory. Increase alkaline phosphate: Check GGT, vitamin D levels (DX: Disorder of the bone?) RTC 6 months

## 2017-07-10 NOTE — Progress Notes (Signed)
Pre visit review using our clinic review tool, if applicable. No additional management support is needed unless otherwise documented below in the visit note. 

## 2017-07-10 NOTE — Assessment & Plan Note (Addendum)
-  Td 2018; Pneumonia shot booster 06-2017;  prevnar 01-2014;  shingles shot  2008; shingrex discussed; had a Flu shot  -Used to see gyn Dr Nori Riis, no further  PAPs ; MMG 08-2016; per guidelines further screen at her age  is optional and MMG would be q 2 years; pt will think about  - Last Cscope 03-2011 Dr. Fuller Plan, had diverticuli and diverticulitis, we talk about further screening, declined  -Labs: CMP, A1c, micro, FLP

## 2017-07-10 NOTE — Progress Notes (Signed)
Subjective:    Patient ID: Sierra Klein, female    DOB: 02-06-1932, 84 y.o.   MRN: [id number]  DOS:  07/10/2017 Type of visit - description : rov Interval history: DM: Diet controlled, no CBGs. Depression: Well controlled on meds. Chronic diarrhea: Unchanged.  Neuropathy: On hydrocodone, symptoms controlled   Review of Systems Denies chest pain or difficulty breathing No nausea, vomiting, blood in the stools.   Past Medical History:  Diagnosis Date  . B12 deficiency anemia   . Cholelithiasis   . Colon polyps   . Diabetes mellitus   . Disturbance, sleep    narcolepsy  . Diverticulitis   . Hiatal hernia   . Hyperlipidemia    started med 6/09  . 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 Osteoarthritis     Past Surgical History:  Procedure Laterality Date  . ABDOMINAL HYSTERECTOMY     no oophorectomy  . BELPHAROPTOSIS REPAIR    . BREAST BIOPSY  09/1983   left  . CATARACT EXTRACTION     l- 9/09, r- 3/10  . CLOSED REDUCTION METACARPAL WITH PERCUTANEOUS PINNING Right 01/12/2015   Procedure: PINNING RIGHT 4 & 5 METACARPAL FRACTURES;  Surgeon: Milly Jakob, MD;  Location: [place];  Service: Orthopedics;  Laterality: Right;  . FOOT SURGERY  03/1988   cyst removed from right heel  . LAPAROSCOPIC LYSIS OF ADHESIONS  2013    RLQ pain resolved after surgery  . pelvic surgery  2003   cysto-recto-enetero cele repair     Social History   Socioeconomic History  . Marital status: Widowed    Spouse name: Not on file  . Number of children: 2  . Years of education: Not on file  . Highest education level: Not on file  Social Needs  . Financial resource strain: Not on file  . Food insecurity - worry: Not on file  . Food insecurity - inability: Not on file  . Transportation needs - medical: Not on file  . Transportation needs - non-medical: Not on file  Occupational History  . Occupation:  retired    Fish farm manager: RETIRED  Tobacco Use  . Smoking status: Former Smoker    Types: Cigarettes    Last attempt to quit: 06/28/1954    Years since quitting: 63.0  . Smokeless tobacco: Never Used  Substance and Sexual Activity  . Alcohol use: Yes    Alcohol/week: 2.4 oz    Types: 4 Glasses of wine per week    Comment: 3-4x/week  . Drug use: No  . Sexual activity: No  Other Topics Concern  . Not on file  Social History Narrative   Married x 26 years, lost husband to a pulm emboli 02-2015    Still drives, independent    Children live @  55 and [place]    2 children, 2 G-children, 1 GG-children     Family History  Problem Relation Age of Onset  . Coronary artery disease Sister   . Heart disease Sister   . Coronary artery disease Mother        tachycardia  . Alzheimer's disease Mother   . Coronary artery disease Brother        x 2 brothers  . Hypertension Brother        x 2 brothers  . Hyperlipidemia Brother   . Hypertension Brother   . Diabetes Unknown  numerous cousins  . Colon cancer Neg Hx   . Breast cancer Neg Hx   . Ovarian cancer Neg Hx      Allergies as of 07/10/2017      Reactions   Sulfonamide Derivatives Hives      Medication List        Accurate as of 07/10/17  5:47 PM. Always use your most recent med list.          AMBIEN 10 MG tablet Generic drug:  zolpidem Take 5 mg by mouth at bedtime as needed.   aspirin EC 81 MG tablet Take 81 mg by mouth daily.   calcium carbonate 1250 MG capsule Take 1,250 mg by mouth 2 (two) times daily with a meal.   cyanocobalamin 1000 MCG tablet Take 100 mcg by mouth daily.   diclofenac sodium 1 % Gel Commonly known as:  VOLTAREN Apply 2 g topically as needed.   diphenoxylate-atropine 2.5-0.025 MG tablet Commonly known as:  LOMOTIL Take 1 tablet by mouth 3 (three) times daily as needed for diarrhea or loose stools.   DULoxetine 60 MG capsule Commonly known as:  CYMBALTA Take 60 mg by mouth 2  (two) times daily.   gabapentin 600 MG tablet Commonly known as:  NEURONTIN Take 600 mg by mouth 4 (four) times daily.   multivitamin per tablet Take 1 tablet by mouth daily.   NORCO 5-325 MG tablet Generic drug:  HYDROcodone-acetaminophen Take by mouth.   NUVIGIL 150 MG tablet Generic drug:  Armodafinil Take 150 mg by mouth daily as needed.   timolol 0.5 % ophthalmic solution Commonly known as:  BETIMOL Place 1 drop into both eyes daily.   VITAMIN D-3 PO Take 1 capsule by mouth daily.          Objective:   Physical Exam BP 124/72 (BP Location: Left Arm, Patient Position: Sitting, Cuff Size: Small)   Pulse (!) 58   Temp 98.1 F (36.7 C) (Oral)   Resp 14   Ht 5\' 5"  (1.651 m)   Wt 144 lb 2 oz (65.4 kg)   SpO2 94%   BMI 23.98 kg/m  General:   Well developed, well nourished . NAD.  Neck: No  thyromegaly  HEENT:  Normocephalic . Face symmetric, atraumatic Lungs:  CTA B Normal respiratory effort, no intercostal retractions, no accessory muscle use. Heart: RRR,  no murmur.  No pretibial edema bilaterally  Abdomen:  Not distended, soft, non-tender. No rebound or rigidity.   DIABETIC FEET EXAM: No lower extremity edema Normal pedal pulses bilaterally Skin: feet slightly cold, tip of the toes  red and some  Bluish discoloration but good capillary refill; pinprick examination of the feet: feeling absent in a sock distribution B. Neurologic:  alert & oriented X3.  Speech normal, gait appropriate for age and unassisted Strength symmetric and appropriate for age.  Psych: Cognition and judgment appear intact.  Cooperative with normal attention span and concentration.  Behavior appropriate. No anxious or depressed appearing.     Assessment & Plan:    Assessment DM Hyperlipidemia Depression, insomnia b 12 deficiency Neuro: -H/o vertigo -Narcolepsy Dr Bertrum Sol -Neuropathy, 1998, not related to DM, workup negative, idiopathic. Dr Jannifer [facility];  hydrocodone per Dr Jannifer Franklin Chronic L ankle edema DJD   Wrist Fx 12-2014 DEXA 12-07 (-) and 7-10 normal; T score -1.8 (07-2015) GI:   HH; colon polyps;  Diverticulitis;  cholelithiasis per CT 2012  PLAN: DM: Diet controlled, check A1c, micro and CMP.  Feet  exam consistent with neuropathy, feet care discussed. Hyperlipidemia: Diet controlled, check FLP Depression, insomnia: Controlled on Cymbalta, Ambien. Narcolepsy, neuropathy: On Nuvigil and hydrocodone rx per neuro.  Used to see Dr. Jannifer Franklin, neurology, but her practice closed.  Will call next month if she needs to get establish w/ a new neurologist. B12 deficiency: on oral supplements, last B12 satisfactory. Increase alkaline phosphate: Check GGT, vitamin D levels (DX: Disorder of the bone?) RTC 6 months

## 2017-07-11 LAB — COMPREHENSIVE METABOLIC PANEL
ALK PHOS: 168 U/L — AB (ref 39–117)
ALT: 16 U/L (ref 0–35)
AST: 21 U/L (ref 0–37)
Albumin: 3.9 g/dL (ref 3.5–5.2)
BUN: 14 mg/dL (ref 6–23)
CHLORIDE: 106 meq/L (ref 96–112)
CO2: 30 meq/L (ref 19–32)
Calcium: 10.1 mg/dL (ref 8.4–10.5)
Creatinine, Ser: 0.58 mg/dL (ref 0.40–1.20)
GFR: 105.34 mL/min (ref >60.00)
Glucose, Bld: 131 mg/dL — ABNORMAL HIGH (ref 70–99)
POTASSIUM: 5.3 meq/L — AB (ref 3.5–5.1)
SODIUM: 140 meq/L (ref 135–145)
TOTAL PROTEIN: 6.3 g/dL (ref 6.0–8.3)
Total Bilirubin: 0.7 mg/dL (ref 0.2–1.2)

## 2017-07-11 LAB — LIPID PANEL
CHOL/HDL RATIO: 4
Cholesterol: 146 mg/dL (ref 0–200)
HDL: 39.8 mg/dL (ref >39.00)
LDL CALC: 74 mg/dL (ref 0–99)
NONHDL: 105.95
Triglycerides: 160 mg/dL — ABNORMAL HIGH (ref 0.0–149.0)
VLDL: 32 mg/dL (ref 0.0–40.0)

## 2017-07-11 LAB — GAMMA GT: GGT: 11 U/L (ref 7–51)

## 2017-07-11 LAB — HEMOGLOBIN A1C: HEMOGLOBIN A1C: 5.7 % (ref 4.6–6.5)

## 2017-07-13 ENCOUNTER — Encounter: Payer: Self-pay | Admitting: Internal Medicine

## 2017-07-13 ENCOUNTER — Other Ambulatory Visit: Payer: Self-pay | Admitting: Internal Medicine

## 2017-07-13 DIAGNOSIS — M889 Osteitis deformans of unspecified bone: Secondary | ICD-10-CM

## 2017-07-13 LAB — VITAMIN D 1,25 DIHYDROXY
VITAMIN D 1, 25 (OH) TOTAL: 82 pg/mL — AB (ref 18–72)
Vitamin D2 1, 25 (OH)2: <8 pg/mL
Vitamin D3 1, 25 (OH)2: 82 pg/mL

## 2017-07-20 ENCOUNTER — Other Ambulatory Visit: Payer: Self-pay | Admitting: Internal Medicine

## 2017-07-20 ENCOUNTER — Other Ambulatory Visit: Payer: Self-pay

## 2017-07-20 ENCOUNTER — Encounter (HOSPITAL_COMMUNITY)
Admission: RE | Admit: 2017-07-20 | Discharge: 2017-07-20 | Disposition: A | Discharge status: Home / Self Care | Payer: Medicare Other | Source: Ambulatory Visit | Attending: Internal Medicine | Admitting: Internal Medicine

## 2017-07-20 DIAGNOSIS — M889 Osteitis deformans of unspecified bone: Secondary | ICD-10-CM | POA: Insufficient documentation

## 2017-07-20 DIAGNOSIS — R7989 Other specified abnormal findings of blood chemistry: Secondary | ICD-10-CM | POA: Diagnosis not present

## 2017-07-20 MED ORDER — TECHNETIUM TC 99M MEDRONATE IV KIT
25.0000 | PACK | Freq: Once | INTRAVENOUS | Status: AC | PRN
  Administered 2017-07-20: 25 via INTRAVENOUS

## 2017-08-16 DIAGNOSIS — H40053 Ocular hypertension, bilateral: Secondary | ICD-10-CM | POA: Diagnosis not present

## 2017-08-16 DIAGNOSIS — H35371 Puckering of macula, right eye: Secondary | ICD-10-CM | POA: Diagnosis not present

## 2017-08-16 DIAGNOSIS — Z961 Presence of intraocular lens: Secondary | ICD-10-CM | POA: Diagnosis not present

## 2017-08-16 DIAGNOSIS — E119 Type 2 diabetes mellitus without complications: Secondary | ICD-10-CM | POA: Diagnosis not present

## 2017-08-16 LAB — HM DIABETES EYE EXAM

## 2017-08-17 ENCOUNTER — Encounter: Payer: Self-pay | Admitting: Internal Medicine

## 2017-08-23 ENCOUNTER — Encounter: Payer: Self-pay | Admitting: Gastroenterology

## 2017-08-23 ENCOUNTER — Ambulatory Visit (INDEPENDENT_AMBULATORY_CARE_PROVIDER_SITE_OTHER): Payer: Medicare Other | Admitting: Gastroenterology

## 2017-08-23 VITALS — BP 120/70 | HR 67 | Ht 65.0 in | Wt 143.8 lb

## 2017-08-23 DIAGNOSIS — R197 Diarrhea, unspecified: Secondary | ICD-10-CM

## 2017-08-23 MED ORDER — DIPHENOXYLATE-ATROPINE 2.5-0.025 MG PO TABS: 1.0000 | ORAL_TABLET | Freq: Three times a day (TID) | ORAL | 4 refills | Status: DC | PRN

## 2017-08-23 NOTE — Progress Notes (Signed)
08/23/2017 Sierra Klein [id number] April 12, 1932   HISTORY OF PRESENT ILLNESS:  This is an 84 year old female with what sounds like IBS-D who is known to Dr. Henrene Pastor.  Has been treated with Lomotil prn and she would like to continue that, needs a new prescription.  Overall doing well.  She finds that her diarrhea seems worse when she is out and about, traveling, etc.  Typically has 3-4 small stools a day.  Also mentions that a lot of times her stools float and seem to have a lot of "air" in them.  No history of pancreatic issues although a previous CT scan showed "the distal pancreas is atrophic or congenitally absent, however, the remainder pancreas appears normal."   Past Medical History:  Diagnosis Date  . B12 deficiency anemia   . Cholelithiasis   . Colon polyps   . Diabetes mellitus   . Disturbance, sleep    narcolepsy  . Diverticulitis   . Hiatal hernia   . Hyperlipidemia    started med 6/09  . 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 Osteoarthritis    Past Surgical History:  Procedure Laterality Date  . ABDOMINAL HYSTERECTOMY     no oophorectomy  . BELPHAROPTOSIS REPAIR    . BREAST BIOPSY  09/1983   left  . CATARACT EXTRACTION     l- 9/09, r- 3/10  . CLOSED REDUCTION METACARPAL WITH PERCUTANEOUS PINNING Right 01/12/2015   Procedure: PINNING RIGHT 4 & 5 METACARPAL FRACTURES;  Surgeon: Milly Jakob, MD;  Location: [place];  Service: Orthopedics;  Laterality: Right;  . FOOT SURGERY  03/1988   cyst removed from right heel  . LAPAROSCOPIC LYSIS OF ADHESIONS  2013    RLQ pain resolved after surgery  . pelvic surgery  2003   cysto-recto-enetero cele repair     reports that she quit smoking about 63 years ago. Her smoking use included cigarettes. she has never used smokeless tobacco. She reports that she drinks about 2.4 oz of alcohol per week. She reports that she does not use drugs. family  history includes Alzheimer's disease in her mother; Coronary artery disease in her brother, mother, and sister; Diabetes in her unknown relative; Heart disease in her sister; Hyperlipidemia in her brother; Hypertension in her brother and brother. Allergies  Allergen Reactions  . Sulfonamide Derivatives Hives      Outpatient Encounter Medications as of 08/23/2017  Medication Sig  . Armodafinil (NUVIGIL) 150 MG tablet Take 150 mg by mouth daily as needed.  Marland Kitchen aspirin EC 81 MG tablet Take 81 mg by mouth daily.  . calcium carbonate 1250 MG capsule Take 1,250 mg by mouth 2 (two) times daily with a meal.  . Cholecalciferol (VITAMIN D-3 PO) Take 1 capsule by mouth daily.    . cyanocobalamin 1000 MCG tablet Take 100 mcg by mouth daily.    . diclofenac sodium (VOLTAREN) 1 % GEL Apply 2 g topically as needed.  . diphenoxylate-atropine (LOMOTIL) 2.5-0.025 MG tablet Take 1 tablet by mouth 3 (three) times daily as needed for diarrhea or loose stools.  . DULoxetine (CYMBALTA) 60 MG capsule Take 60 mg by mouth 2 (two) times daily.  Marland Kitchen gabapentin (NEURONTIN) 600 MG tablet Take 600 mg by mouth 4 (four) times daily.    Marland Kitchen HYDROcodone-acetaminophen (NORCO) 5-325 MG per tablet Take by mouth.  . multivitamin (THERAGRAN) per tablet Take 1 tablet by mouth daily.    Marland Kitchen  timolol (BETIMOL) 0.5 % ophthalmic solution Place 1 drop into both eyes daily.  Marland Kitchen zolpidem (AMBIEN) 10 MG tablet Take 5 mg by mouth at bedtime as needed.     No facility-administered encounter medications on file as of 08/23/2017.      REVIEW OF SYSTEMS  : All other systems reviewed and negative except where noted in the History of Present Illness.   PHYSICAL EXAM: BP 120/70   Pulse 67   Ht 5\' 5"  (1.651 m)   Wt 143 lb 12.8 oz (65.2 kg)   SpO2 97%   BMI 23.93 kg/m  General: Well developed white female in no acute distress Head: Normocephalic and atraumatic Eyes:  sclerae anicteric,conjunctive pink. Ears: Normal auditory acuity Neck: Supple,  no masses.  Lungs: Clear throughout to auscultation; no increased WOB. Heart: Regular rate and rhythm; no M/R/G. Abdomen: Soft, non-distended.  BS present.  Non-tender. Musculoskeletal: Symmetrical with no gross deformities  Skin: No lesions on visible extremities Extremities: No edema  Neurological: Alert oriented x 4, grossly non-focal Psychological:  Alert and cooperative. Normal mood and affect  ASSESSMENT AND PLAN: *84 year old female with what sounds like IBS-D.  Has been treated with Lomotil prn and she would like to continue that, needs a new prescription.  I have also recommended that maybe she try a daily fiber supplement such as Benefiber or Citrucel to help bulk her stools.  She also described stools that float and seem to have a lot of "air" in them.  I mentioned stool study/fecal elastase to check for PI as that could be treated with pancreatic enzymes.  She is going to try the fiber supplement first and will call back if she decides to do the stool study.   CC:  Colon Branch, MD

## 2017-08-23 NOTE — Patient Instructions (Signed)
If you are age 82 or older, your body mass index should be between 23-30. Your Body mass index is 23.93 kg/m. If this is out of the aforementioned range listed, please consider follow up with your Primary Care Provider.  If you are age 27 or younger, your body mass index should be between 19-25. Your Body mass index is 23.93 kg/m. If this is out of the aformentioned range listed, please consider follow up with your Primary Care Provider.   We have sent the following medications to your pharmacy for you to pick up at your convenience: Lomotil  Take Benefiber or Citrucel daily.  Follow up as needed.  Thank you for choosing me and [facility].  Alonza Bogus, PA-C

## 2017-08-24 NOTE — Progress Notes (Signed)
Assessment and plans reviewed  

## 2017-08-29 ENCOUNTER — Telehealth: Payer: Self-pay | Admitting: Gastroenterology

## 2017-08-29 NOTE — Telephone Encounter (Signed)
Patient states medication lomotil was not at pharmacy and would like it resent to [facility].

## 2017-08-30 ENCOUNTER — Other Ambulatory Visit: Payer: Self-pay

## 2017-08-30 MED ORDER — DIPHENOXYLATE-ATROPINE 2.5-0.025 MG PO TABS: 1.0000 | ORAL_TABLET | Freq: Three times a day (TID) | ORAL | 4 refills | Status: DC | PRN

## 2017-08-31 ENCOUNTER — Other Ambulatory Visit: Payer: Self-pay

## 2017-08-31 MED ORDER — DIPHENOXYLATE-ATROPINE 2.5-0.025 MG PO TABS: 1.0000 | ORAL_TABLET | Freq: Three times a day (TID) | ORAL | 4 refills | Status: DC | PRN

## 2018-01-08 ENCOUNTER — Ambulatory Visit: Payer: Medicare Other | Admitting: Internal Medicine

## 2018-01-21 DIAGNOSIS — G603 Idiopathic progressive neuropathy: Secondary | ICD-10-CM | POA: Diagnosis not present

## 2018-01-21 DIAGNOSIS — R531 Weakness: Secondary | ICD-10-CM | POA: Diagnosis not present

## 2018-01-21 DIAGNOSIS — M792 Neuralgia and neuritis, unspecified: Secondary | ICD-10-CM | POA: Diagnosis not present

## 2018-01-21 DIAGNOSIS — G4719 Other hypersomnia: Secondary | ICD-10-CM | POA: Diagnosis not present

## 2018-01-21 DIAGNOSIS — F329 Major depressive disorder, single episode, unspecified: Secondary | ICD-10-CM | POA: Diagnosis not present

## 2018-01-21 DIAGNOSIS — Z9181 History of falling: Secondary | ICD-10-CM | POA: Diagnosis not present

## 2018-01-23 ENCOUNTER — Ambulatory Visit: Payer: Medicare Other | Admitting: Internal Medicine

## 2018-02-14 DIAGNOSIS — H40053 Ocular hypertension, bilateral: Secondary | ICD-10-CM | POA: Diagnosis not present

## 2018-02-14 DIAGNOSIS — H35371 Puckering of macula, right eye: Secondary | ICD-10-CM | POA: Diagnosis not present

## 2018-02-14 DIAGNOSIS — H52203 Unspecified astigmatism, bilateral: Secondary | ICD-10-CM | POA: Diagnosis not present

## 2018-02-14 DIAGNOSIS — H02831 Dermatochalasis of right upper eyelid: Secondary | ICD-10-CM | POA: Diagnosis not present

## 2018-03-28 NOTE — Progress Notes (Addendum)
Subjective:   Sierra Klein is a 84 y.o. female who presents for Medicare Annual (Subsequent) preventive examination.  Review of Systems: No ROS.  Medicare Wellness Visit. Additional risk factors are reflected in the social history. Cardiac Risk Factors include: advanced age (>48men, >20 women);diabetes mellitus;dyslipidemia Sleep patterns:  Takes hydrocodone nightly. Restless sleep. Takes Ambien nightly. Home Safety/Smoke Alarms: Feels safe in home. Smoke alarms in place. Pt lives on 1 story of 2 story home. Lives alone.    Female:       Mammo- pt states she will only do every other yr.      Dexa scan-utd           Objective:     Vitals: BP 120/62 (BP Location: Left Arm, Patient Position: Sitting, Cuff Size: Normal)   Pulse 63   Ht 5\' 5"  (1.651 m)   Wt 145 lb 3.2 oz (65.9 kg)   SpO2 95%   BMI 24.16 kg/m   Body mass index is 24.16 kg/m.  Advanced Directives 04/04/2018 06/28/2016  Does Patient Have a Medical Advance Directive? Yes Yes  Type of Paramedic of [place];Living will Living will  Copy of [place] in Chart? No - copy requested -    Tobacco Social History   Tobacco Use  Smoking Status Former Smoker  . Types: Cigarettes  . Last attempt to quit: 06/28/1954  . Years since quitting: 63.8  Smokeless Tobacco Never Used     Counseling given: Not Answered   Clinical Intake: Pain : No/denies pain     Past Medical History:  Diagnosis Date  . B12 deficiency anemia   . Cholelithiasis   . Colon polyps   . Diabetes mellitus   . Disturbance, sleep    narcolepsy  . Diverticulitis   . Hiatal hernia   . Hyperlipidemia    started med 6/09  . 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 Osteoarthritis    Past Surgical History:  Procedure Laterality Date  . ABDOMINAL HYSTERECTOMY     no oophorectomy  . BELPHAROPTOSIS REPAIR    . BREAST BIOPSY  09/1983   left  . CATARACT EXTRACTION     l- 9/09, r- 3/10  . CLOSED REDUCTION METACARPAL WITH PERCUTANEOUS PINNING Right 01/12/2015   Procedure: PINNING RIGHT 4 & 5 METACARPAL FRACTURES;  Surgeon: Milly Jakob, MD;  Location: [place];  Service: Orthopedics;  Laterality: Right;  . FOOT SURGERY  03/1988   cyst removed from right heel  . LAPAROSCOPIC LYSIS OF ADHESIONS  2013    RLQ pain resolved after surgery  . pelvic surgery  2003   cysto-recto-enetero cele repair    Family History  Problem Relation Age of Onset  . Coronary artery disease Sister   . Heart disease Sister   . Coronary artery disease Mother        tachycardia  . Alzheimer's disease Mother   . Coronary artery disease Brother        x 2 brothers  . Hypertension Brother        x 2 brothers  . Hyperlipidemia Brother   . Hypertension Brother   . Diabetes Unknown        numerous cousins  . Colon cancer Neg Hx   . Breast cancer Neg Hx   . Ovarian cancer Neg Hx    Social History   Socioeconomic History  . Marital status: Widowed  Spouse name: Not on file  . Number of children: 2  . Years of education: Not on file  . Highest education level: Not on file  Occupational History  . Occupation: retired    Fish farm manager: RETIRED  Social Needs  . Financial resource strain: Not on file  . Food insecurity:    Worry: Not on file    Inability: Not on file  . Transportation needs:    Medical: Not on file    Non-medical: Not on file  Tobacco Use  . Smoking status: Former Smoker    Types: Cigarettes    Last attempt to quit: 06/28/1954    Years since quitting: 63.8  . Smokeless tobacco: Never Used  Substance and Sexual Activity  . Alcohol use: Yes    Alcohol/week: 4.0 standard drinks    Types: 4 Glasses of wine per week    Comment: 3-4x/week  . Drug use: No  . Sexual activity: Never  Lifestyle  . Physical activity:    Days per week: Not on file    Minutes per session: Not on file  . Stress: Not on file    Relationships  . Social connections:    Talks on phone: Not on file    Gets together: Not on file    Attends religious service: Not on file    Active member of club or organization: Not on file    Attends meetings of clubs or organizations: Not on file    Relationship status: Not on file  Other Topics Concern  . Not on file  Social History Narrative   Married x 61 years, lost husband to a pulm emboli 02-2015    Still drives, independent    Children live @  [place]    2 children, 2 G-children, 1 GG-children    Outpatient Encounter Medications as of 04/04/2018  Medication Sig  . Armodafinil (NUVIGIL) 150 MG tablet Take 150 mg by mouth daily as needed.  Marland Kitchen aspirin EC 81 MG tablet Take 81 mg by mouth daily.  . calcium carbonate 1250 MG capsule Take 1,250 mg by mouth 2 (two) times daily with a meal.  . Cholecalciferol (VITAMIN D-3 PO) Take 1 capsule by mouth daily.    . cyanocobalamin 1000 MCG tablet Take 100 mcg by mouth daily.    . diclofenac sodium (VOLTAREN) 1 % GEL Apply 2 g topically as needed.  . diphenoxylate-atropine (LOMOTIL) 2.5-0.025 MG tablet Take 1 tablet by mouth 3 (three) times daily as needed for diarrhea or loose stools.  . DULoxetine (CYMBALTA) 60 MG capsule Take 60 mg by mouth daily.   Marland Kitchen gabapentin (NEURONTIN) 600 MG tablet Take 600 mg by mouth 4 (four) times daily.    Marland Kitchen HYDROcodone-acetaminophen (NORCO) 5-325 MG per tablet Take by mouth.  . multivitamin (THERAGRAN) per tablet Take 1 tablet by mouth daily.    . timolol (BETIMOL) 0.5 % ophthalmic solution Place 1 drop into both eyes daily.  Marland Kitchen zolpidem (AMBIEN) 10 MG tablet Take 5 mg by mouth at bedtime as needed.     No facility-administered encounter medications on file as of 04/04/2018.     Activities of Daily Living In your present state of health, do you have any difficulty performing the following activities: 04/04/2018 07/10/2017  Hearing? N N  Vision? N N  Difficulty concentrating or making  decisions? N N  Walking or climbing stairs? N N  Dressing or bathing? N N  Doing errands, shopping? N N  Conservation officer, nature and  eating ? N -  Using the Toilet? N -  In the past six months, have you accidently leaked urine? N -  Do you have problems with loss of bowel control? N -  Managing your Medications? N -  Managing your Finances? N -  Housekeeping or managing your Housekeeping? N -  Some recent data might be hidden    Patient Care Team: Colon Branch, MD as PCP - General Henrene Pastor Docia Chuck, MD as Consulting Physician (Gastroenterology) Leland Johns., MD as Referring Physician (Neurology) Shon Hough, MD as Consulting Physician (Ophthalmology)    Assessment:   This is a routine wellness examination for [place]. Physical assessment deferred to PCP.  Exercise Activities and Dietary recommendations Current Exercise Habits: The patient does not participate in regular exercise at present, Exercise limited by: None identified Diet (meal preparation, eat out, water intake, caffeinated beverages, dairy products, fruits and vegetables): in general, a "healthy" diet  , well balanced  Goals    . Patient Stated     Continue to eat heart healthy diet (full of fruits, vegetables, whole grains, lean protein, water--limit salt, fat, and sugar intake) and increase physical activity as tolerated. Continue doing brain stimulating activities (puzzles, reading, adult coloring books, staying active) to keep memory sharp.         Fall Risk Fall Risk  04/04/2018 07/10/2017 06/28/2016 11/30/2015 01/05/2015  Falls in the past year? No No No No Yes  Number falls in past yr: - - - - 2 or more  Injury with Fall? - - - - Yes  Comment - - - - -  Follow up - - - - Falls evaluation completed     Depression Screen PHQ 2/9 Scores 04/04/2018 07/10/2017 06/28/2016 11/30/2015  PHQ - 2 Score 1 0 0 1     Cognitive Function Ad8 score reviewed for issues:  Issues making decisions:no  Less interest in hobbies /  activities:no  Repeats questions, stories (family complaining):no  Trouble using ordinary gadgets (microwave, computer, phone):no  Forgets the month or year: no  Mismanaging finances: no  Remembering appts:no  Daily problems with thinking and/or memory:no Ad8 score is=0   MMSE - Mini Mental State Exam 06/28/2016  Orientation to time 5  Orientation to Place 5  Registration 3  Attention/ Calculation 5  Recall 3  Language- name 2 objects 2  Language- repeat 1  Language- follow 3 step command 3  Language- read & follow direction 1  Write a sentence 1  Copy design 1  Total score 30        Immunization History  Administered Date(s) Administered  . Influenza Split 04/10/2011, 04/18/2012  . Influenza Whole 05/03/2007, 04/22/2008, 04/14/2009, 04/13/2010  . Influenza, High Dose Seasonal PF 05/13/2013, 05/25/2015, 03/14/2016  . Influenza,inj,Quad PF,6+ Mos 03/27/2014  . Influenza-Unspecified 05/08/2017  . Pneumococcal Conjugate-13 02/11/2014  . Pneumococcal Polysaccharide-23 04/26/2006, 07/10/2017  . Td 04/26/2006, 06/28/2016  . Zoster 10/09/2006    Screening Tests Health Maintenance  Topic Date Due  . HEMOGLOBIN A1C  01/07/2018  . INFLUENZA VACCINE  01/24/2018  . FOOT EXAM  07/10/2018  . URINE MICROALBUMIN  07/10/2018  . OPHTHALMOLOGY EXAM  08/16/2018  . TETANUS/TDAP  06/28/2026  . DEXA SCAN  Completed  . PNA vac Low Risk Adult  Completed       Plan:    Please schedule your next medicare wellness visit with me in 1 yr.  Continue to eat heart healthy diet (full of fruits, vegetables, whole grains, lean  protein, water--limit salt, fat, and sugar intake) and increase physical activity as tolerated.  Continue doing brain stimulating activities (puzzles, reading, adult coloring books, staying active) to keep memory sharp.   Bring a copy of your living will and/or healthcare power of attorney to your next office visit.  You received your flu shot today.   I have  personally reviewed and noted the following in the patient's chart:   . Medical and social history . Use of alcohol, tobacco or illicit drugs  . Current medications and supplements . Functional ability and status . Nutritional status . Physical activity . Advanced directives . List of other physicians . Hospitalizations, surgeries, and ER visits in previous 12 months . Vitals . Screenings to include cognitive, depression, and falls . Referrals and appointments  In addition, I have reviewed and discussed with patient certain preventive protocols, quality metrics, and best practice recommendations. A written personalized care plan for preventive services as well as general preventive health recommendations were provided to patient.     Shela Nevin, [place]  04/04/2018   Reviewed and agree with assessment & plan. Mackie Pai, PA-C

## 2018-04-04 ENCOUNTER — Encounter: Payer: Self-pay | Admitting: *Deleted

## 2018-04-04 ENCOUNTER — Ambulatory Visit (INDEPENDENT_AMBULATORY_CARE_PROVIDER_SITE_OTHER): Payer: Medicare Other | Admitting: *Deleted

## 2018-04-04 VITALS — BP 120/62 | HR 63 | Ht 65.0 in | Wt 145.2 lb

## 2018-04-04 DIAGNOSIS — Z23 Encounter for immunization: Secondary | ICD-10-CM | POA: Diagnosis not present

## 2018-04-04 DIAGNOSIS — Z0000 Encounter for general adult medical examination without abnormal findings: Secondary | ICD-10-CM | POA: Diagnosis not present

## 2018-04-04 NOTE — Addendum Note (Signed)
Addended by: Naaman Plummer A on: 04/04/2018 01:52 PM   Modules accepted: Level of Service

## 2018-04-04 NOTE — Patient Instructions (Signed)
Please schedule your next medicare wellness visit with me in 1 yr.  Continue to eat heart healthy diet (full of fruits, vegetables, whole grains, lean protein, water--limit salt, fat, and sugar intake) and increase physical activity as tolerated.  Continue doing brain stimulating activities (puzzles, reading, adult coloring books, staying active) to keep memory sharp.   Bring a copy of your living will and/or healthcare power of attorney to your next office visit.  You received your flu shot today.    Sierra Klein , Thank you for taking time to come for your Medicare Wellness Visit. I appreciate your ongoing commitment to your health goals. Please review the following plan we discussed and let me know if I can assist you in the future.   These are the goals we discussed: Goals    . Patient Stated     Continue to eat heart healthy diet (full of fruits, vegetables, whole grains, lean protein, water--limit salt, fat, and sugar intake) and increase physical activity as tolerated. Continue doing brain stimulating activities (puzzles, reading, adult coloring books, staying active) to keep memory sharp.         This is a list of the screening recommended for you and due dates:  Health Maintenance  Topic Date Due  . Hemoglobin A1C  01/07/2018  . Flu Shot  01/24/2018  . Complete foot exam   07/10/2018  . Urine Protein Check  07/10/2018  . Eye exam for diabetics  08/16/2018  . Tetanus Vaccine  06/28/2026  . DEXA scan (bone density measurement)  Completed  . Pneumonia vaccines  Completed    Health Maintenance for Postmenopausal Women Menopause is a normal process in which your reproductive ability comes to an end. This process happens gradually over a span of months to years, usually between the ages of 47 and 63. Menopause is complete when you have missed 12 consecutive menstrual periods. It is important to talk with your health care provider about some of the most common conditions that  affect postmenopausal women, such as heart disease, cancer, and bone loss (osteoporosis). Adopting a healthy lifestyle and getting preventive care can help to promote your health and wellness. Those actions can also lower your chances of developing some of these common conditions. What should I know about menopause? During menopause, you may experience a number of symptoms, such as:  Moderate-to-severe hot flashes.  Night sweats.  Decrease in sex drive.  Mood swings.  Headaches.  Tiredness.  Irritability.  Memory problems.  Insomnia.  Choosing to treat or not to treat menopausal changes is an individual decision that you make with your health care provider. What should I know about hormone replacement therapy and supplements? Hormone therapy products are effective for treating symptoms that are associated with menopause, such as hot flashes and night sweats. Hormone replacement carries certain risks, especially as you become older. If you are thinking about using estrogen or estrogen with progestin treatments, discuss the benefits and risks with your health care provider. What should I know about heart disease and stroke? Heart disease, heart attack, and stroke become more likely as you age. This may be due, in part, to the hormonal changes that your body experiences during menopause. These can affect how your body processes dietary fats, triglycerides, and cholesterol. Heart attack and stroke are both medical emergencies. There are many things that you can do to help prevent heart disease and stroke:  Have your blood pressure checked at least every 1-2 years. High blood pressure causes  heart disease and increases the risk of stroke.  If you are 25-66 years old, ask your health care provider if you should take aspirin to prevent a heart attack or a stroke.  Do not use any tobacco products, including cigarettes, chewing tobacco, or electronic cigarettes. If you need help quitting, ask  your health care provider.  It is important to eat a healthy diet and maintain a healthy weight. ? Be sure to include plenty of vegetables, fruits, low-fat dairy products, and lean protein. ? Avoid eating foods that are high in solid fats, added sugars, or salt (sodium).  Get regular exercise. This is one of the most important things that you can do for your health. ? Try to exercise for at least 150 minutes each week. The type of exercise that you do should increase your heart rate and make you sweat. This is known as moderate-intensity exercise. ? Try to do strengthening exercises at least twice each week. Do these in addition to the moderate-intensity exercise.  Know your numbers.Ask your health care provider to check your cholesterol and your blood glucose. Continue to have your blood tested as directed by your health care provider.  What should I know about cancer screening? There are several types of cancer. Take the following steps to reduce your risk and to catch any cancer development as early as possible. Breast Cancer  Practice breast self-awareness. ? This means understanding how your breasts normally appear and feel. ? It also means doing regular breast self-exams. Let your health care provider know about any changes, no matter how small.  If you are 48 or older, have a clinician do a breast exam (clinical breast exam or CBE) every year. Depending on your age, family history, and medical history, it may be recommended that you also have a yearly breast X-ray (mammogram).  If you have a family history of breast cancer, talk with your health care provider about genetic screening.  If you are at high risk for breast cancer, talk with your health care provider about having an MRI and a mammogram every year.  Breast cancer (BRCA) gene test is recommended for women who have family members with BRCA-related cancers. Results of the assessment will determine the need for genetic  counseling and BRCA1 and for BRCA2 testing. BRCA-related cancers include these types: ? Breast. This occurs in males or females. ? Ovarian. ? Tubal. This may also be called fallopian tube cancer. ? Cancer of the abdominal or pelvic lining (peritoneal cancer). ? Prostate. ? Pancreatic.  Cervical, Uterine, and Ovarian Cancer Your health care provider may recommend that you be screened regularly for cancer of the pelvic organs. These include your ovaries, uterus, and vagina. This screening involves a pelvic exam, which includes checking for microscopic changes to the surface of your cervix (Pap test).  For women ages 21-65, health care providers may recommend a pelvic exam and a Pap test every three years. For women ages 69-65, they may recommend the Pap test and pelvic exam, combined with testing for human papilloma virus (HPV), every five years. Some types of HPV increase your risk of cervical cancer. Testing for HPV may also be done on women of any age who have unclear Pap test results.  Other health care providers may not recommend any screening for nonpregnant women who are considered low risk for pelvic cancer and have no symptoms. Ask your health care provider if a screening pelvic exam is right for you.  If you have had past  treatment for cervical cancer or a condition that could lead to cancer, you need Pap tests and screening for cancer for at least 20 years after your treatment. If Pap tests have been discontinued for you, your risk factors (such as having a new sexual partner) need to be reassessed to determine if you should start having screenings again. Some women have medical problems that increase the chance of getting cervical cancer. In these cases, your health care provider may recommend that you have screening and Pap tests more often.  If you have a family history of uterine cancer or ovarian cancer, talk with your health care provider about genetic screening.  If you have  vaginal bleeding after reaching menopause, tell your health care provider.  There are currently no reliable tests available to screen for ovarian cancer.  Lung Cancer Lung cancer screening is recommended for adults 56-45 years old who are at high risk for lung cancer because of a history of smoking. A yearly low-dose CT scan of the lungs is recommended if you:  Currently smoke.  Have a history of at least 30 pack-years of smoking and you currently smoke or have quit within the past 15 years. A pack-year is smoking an average of one pack of cigarettes per day for one year.  Yearly screening should:  Continue until it has been 15 years since you quit.  Stop if you develop a health problem that would prevent you from having lung cancer treatment.  Colorectal Cancer  This type of cancer can be detected and can often be prevented.  Routine colorectal cancer screening usually begins at age 28 and continues through age 5.  If you have risk factors for colon cancer, your health care provider may recommend that you be screened at an earlier age.  If you have a family history of colorectal cancer, talk with your health care provider about genetic screening.  Your health care provider may also recommend using home test kits to check for hidden blood in your stool.  A small camera at the end of a tube can be used to examine your colon directly (sigmoidoscopy or colonoscopy). This is done to check for the earliest forms of colorectal cancer.  Direct examination of the colon should be repeated every 5-10 years until age 70. However, if early forms of precancerous polyps or small growths are found or if you have a family history or genetic risk for colorectal cancer, you may need to be screened more often.  Skin Cancer  Check your skin from head to toe regularly.  Monitor any moles. Be sure to tell your health care provider: ? About any new moles or changes in moles, especially if there is a  change in a mole's shape or color. ? If you have a mole that is larger than the size of a pencil eraser.  If any of your family members has a history of skin cancer, especially at a young age, talk with your health care provider about genetic screening.  Always use sunscreen. Apply sunscreen liberally and repeatedly throughout the day.  Whenever you are outside, protect yourself by wearing long sleeves, pants, a wide-brimmed hat, and sunglasses.  What should I know about osteoporosis? Osteoporosis is a condition in which bone destruction happens more quickly than new bone creation. After menopause, you may be at an increased risk for osteoporosis. To help prevent osteoporosis or the bone fractures that can happen because of osteoporosis, the following is recommended:  If you are  42-65 years old, get at least 1,000 mg of calcium and at least 600 mg of vitamin D per day.  If you are older than age 65 but younger than age 77, get at least 1,200 mg of calcium and at least 600 mg of vitamin D per day.  If you are older than age 55, get at least 1,200 mg of calcium and at least 800 mg of vitamin D per day.  Smoking and excessive alcohol intake increase the risk of osteoporosis. Eat foods that are rich in calcium and vitamin D, and do weight-bearing exercises several times each week as directed by your health care provider. What should I know about how menopause affects my mental health? Depression may occur at any age, but it is more common as you become older. Common symptoms of depression include:  Low or sad mood.  Changes in sleep patterns.  Changes in appetite or eating patterns.  Feeling an overall lack of motivation or enjoyment of activities that you previously enjoyed.  Frequent crying spells.  Talk with your health care provider if you think that you are experiencing depression. What should I know about immunizations? It is important that you get and maintain your immunizations.  These include:  Tetanus, diphtheria, and pertussis (Tdap) booster vaccine.  Influenza every year before the flu season begins.  Pneumonia vaccine.  Shingles vaccine.  Your health care provider may also recommend other immunizations. This information is not intended to replace advice given to you by your health care provider. Make sure you discuss any questions you have with your health care provider. Document Released: 08/04/2005 Document Revised: 12/31/2015 Document Reviewed: 03/16/2015 Elsevier Interactive Patient Education  2018 [employer].

## 2018-04-15 ENCOUNTER — Other Ambulatory Visit: Payer: Self-pay | Admitting: Gastroenterology

## 2018-04-16 NOTE — Telephone Encounter (Signed)
May I refill ? 

## 2018-04-16 NOTE — Telephone Encounter (Signed)
Ok to refill.  Would only put 2 other refills on it this time, however, since she will need an OV come spring in order to renew and get an update on her symptoms.  Thank you,  Jess

## 2018-05-14 ENCOUNTER — Ambulatory Visit: Payer: Medicare Other | Admitting: Internal Medicine

## 2018-06-05 ENCOUNTER — Ambulatory Visit (INDEPENDENT_AMBULATORY_CARE_PROVIDER_SITE_OTHER): Payer: Medicare Other | Admitting: Internal Medicine

## 2018-06-05 ENCOUNTER — Encounter: Payer: Self-pay | Admitting: Internal Medicine

## 2018-06-05 VITALS — BP 126/72 | HR 65 | Temp 97.5°F | Resp 16 | Ht 65.0 in | Wt 145.5 lb

## 2018-06-05 DIAGNOSIS — E1140 Type 2 diabetes mellitus with diabetic neuropathy, unspecified: Secondary | ICD-10-CM | POA: Diagnosis not present

## 2018-06-05 DIAGNOSIS — F329 Major depressive disorder, single episode, unspecified: Secondary | ICD-10-CM | POA: Diagnosis not present

## 2018-06-05 DIAGNOSIS — G629 Polyneuropathy, unspecified: Secondary | ICD-10-CM | POA: Diagnosis not present

## 2018-06-05 DIAGNOSIS — E875 Hyperkalemia: Secondary | ICD-10-CM | POA: Diagnosis not present

## 2018-06-05 DIAGNOSIS — F32A Depression, unspecified: Secondary | ICD-10-CM

## 2018-06-05 MED ORDER — ZOSTER VAC RECOMB ADJUVANTED 50 MCG/0.5ML IM SUSR: 0.5000 mL | Freq: Once | INTRAMUSCULAR | 1 refills | Status: AC

## 2018-06-05 NOTE — Progress Notes (Signed)
Subjective:    Patient ID: Sierra Klein, female    DOB: 03-05-1932, 84 y.o.   MRN: [id number]  DOS:  06/05/2018 Type of visit - description : Routine visit This year, she has lost 2 brothers, obviously feeling down and sad about it.  Good medication compliance.  Review of Systems Denies suicidal ideas  Past Medical History:  Diagnosis Date  . B12 deficiency anemia   . Cholelithiasis   . Colon polyps   . Diabetes mellitus   . Disturbance, sleep    narcolepsy  . Diverticulitis   . Hiatal hernia   . Hyperlipidemia    started med 6/09  . 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 Osteoarthritis     Past Surgical History:  Procedure Laterality Date  . ABDOMINAL HYSTERECTOMY     no oophorectomy  . BELPHAROPTOSIS REPAIR    . BREAST BIOPSY  09/1983   left  . CATARACT EXTRACTION     l- 9/09, r- 3/10  . CLOSED REDUCTION METACARPAL WITH PERCUTANEOUS PINNING Right 01/12/2015   Procedure: PINNING RIGHT 4 & 5 METACARPAL FRACTURES;  Surgeon: Milly Jakob, MD;  Location: [facility];  Service: Orthopedics;  Laterality: Right;  . FOOT SURGERY  03/1988   cyst removed from right heel  . LAPAROSCOPIC LYSIS OF ADHESIONS  2013    RLQ pain resolved after surgery  . pelvic surgery  2003   cysto-recto-enetero cele repair     Social History   Socioeconomic History  . Marital status: Widowed    Spouse name: Not on file  . Number of children: 2  . Years of education: Not on file  . Highest education level: Not on file  Occupational History  . Occupation: retired    Fish farm manager: RETIRED  Social Needs  . Financial resource strain: Not on file  . Food insecurity:    Worry: Not on file    Inability: Not on file  . Transportation needs:    Medical: Not on file    Non-medical: Not on file  Tobacco Use  . Smoking status: Former Smoker    Types: Cigarettes    Last attempt to quit: 06/28/1954    Years since  quitting: 63.9  . Smokeless tobacco: Never Used  Substance and Sexual Activity  . Alcohol use: Yes    Alcohol/week: 4.0 standard drinks    Types: 4 Glasses of wine per week    Comment: 3-4x/week  . Drug use: No  . Sexual activity: Never  Lifestyle  . Physical activity:    Days per week: Not on file    Minutes per session: Not on file  . Stress: Not on file  Relationships  . Social connections:    Talks on phone: Not on file    Gets together: Not on file    Attends religious service: Not on file    Active member of club or organization: Not on file    Attends meetings of clubs or organizations: Not on file    Relationship status: Not on file  . Intimate partner violence:    Fear of current or ex partner: Not on file    Emotionally abused: Not on file    Physically abused: Not on file    Forced sexual activity: Not on file  Other Topics Concern  . Not on file  Social History Narrative   Married x 61 years, lost husband to a pulm  [place] 02-2015    Still drives, independent    Children live @  [facility]    2 children, 2 G-children, 1 GG-children   Lost a brother 2011, lost sister and brother 2019. She is the only survivor       Allergies as of 06/05/2018      Reactions   Sulfonamide Derivatives Hives      Medication List       Accurate as of June 05, 2018 11:59 PM. Always use your most recent med list.        AMBIEN 10 MG tablet Generic drug:  zolpidem Take 5 mg by mouth at bedtime as needed.   aspirin EC 81 MG tablet Take 81 mg by mouth daily.   calcium carbonate 1250 MG capsule Take 1,250 mg by mouth 2 (two) times daily with a meal.   cyanocobalamin 1000 MCG tablet Take 100 mcg by mouth daily.   diclofenac sodium 1 % Gel Commonly known as:  VOLTAREN Apply 2 g topically as needed.   diphenoxylate-atropine 2.5-0.025 MG tablet Commonly known as:  LOMOTIL TAKE 1 TABLET BY MOUTH THREE TIMES DAILY AS NEEDED FOR DIARRHEA   DULoxetine 60 MG  capsule Commonly known as:  CYMBALTA Take 60 mg by mouth daily.   gabapentin 600 MG tablet Commonly known as:  NEURONTIN Take 600 mg by mouth 4 (four) times daily.   multivitamin per tablet Take 1 tablet by mouth daily.   NORCO 5-325 MG tablet Generic drug:  HYDROcodone-acetaminophen Take by mouth.   NUVIGIL 150 MG tablet Generic drug:  Armodafinil Take 150 mg by mouth daily as needed.   timolol 0.5 % ophthalmic solution Commonly known as:  BETIMOL Place 1 drop into both eyes daily.   VITAMIN D-3 PO Take 1 capsule by mouth daily.   Zoster Vaccine Adjuvanted injection Commonly known as:  SHINGRIX Inject 0.5 mLs into the muscle once for 1 dose.           Objective:   Physical Exam BP 126/72 (BP Location: Left Arm, Patient Position: Sitting, Cuff Size: Small)   Pulse 65   Temp (!) 97.5 F (36.4 C) (Oral)   Resp 16   Ht 5\' 5"  (1.651 m)   Wt 145 lb 8 oz (66 kg)   SpO2 90%   BMI 24.21 kg/m      General:   Well developed, NAD, BMI noted. HEENT:  Normocephalic . Face symmetric, atraumatic Lungs:  CTA B Normal respiratory effort, no intercostal retractions, no accessory muscle use. Heart: RRR,  no murmur.  No pretibial edema bilaterally  Skin: Not pale. Not jaundice Neurologic:  alert & oriented X3.  Speech normal, gait appropriate for age and unassisted Psych--  Cognition and judgment appear intact.  Cooperative with normal attention span and concentration.  Behavior appropriate. No anxious or depressed appearing.   Assessment & Plan:    Assessment DM Hyperlipidemia Depression, insomnia b 12 deficiency Neuro: -H/o vertigo -Narcolepsy Dr Bertrum Sol -Neuropathy, 1998, not related to DM, workup negative, idiopathic. Dr Jannifer [facility]; hydrocodone per Dr Jannifer Franklin Chronic L ankle edema DJD   Wrist Fx 12-2014 DEXA 12-07 (-) and 7-10 normal; T score -1.8 (07-2015) GI:   HH; colon polyps;  Diverticulitis;  cholelithiasis per CT 2012  PLAN: DM:  Diet controlled, check A1c, micro, BMP. Neuropathy: Follow-up by neurology.  Feet care discussed Depression insomnia: Currently on Cymbalta, Ambien. She lost her older sister and older brother this year, she was close to  them.  She is obviously affected.  Denies suicidal ideas.  We had a long conversation about the issue, provided counseling the best I could.  Encouraged to reach out to family, friends and to me if she feels medication needs to be adjusted. --Check CBC and TSH --Shingrix prescription provided RTC 4 to 6 months

## 2018-06-05 NOTE — Progress Notes (Signed)
Pre visit review using our clinic review tool, if applicable. No additional management support is needed unless otherwise documented below in the visit note. 

## 2018-06-05 NOTE — Patient Instructions (Signed)
GO TO THE LAB : Get the blood work     GO TO THE FRONT DESK Schedule your next appointment for a  Check up in 4-6 months

## 2018-06-06 LAB — CBC WITH DIFFERENTIAL/PLATELET
BASOS ABS: 0 10*3/uL (ref 0.0–0.1)
Basophils Relative: 0.6 % (ref 0.0–3.0)
EOS ABS: 0.1 10*3/uL (ref 0.0–0.7)
Eosinophils Relative: 1.9 % (ref 0.0–5.0)
HCT: 41.1 % (ref 36.0–46.0)
HEMOGLOBIN: 13.1 g/dL (ref 12.0–15.0)
LYMPHS ABS: 1.1 10*3/uL (ref 0.7–4.0)
Lymphocytes Relative: 24 % (ref 12.0–46.0)
MCHC: 31.7 g/dL (ref 30.0–36.0)
MCV: 90.6 fl (ref 78.0–100.0)
MONOS PCT: 12.8 % — AB (ref 3.0–12.0)
Monocytes Absolute: 0.6 10*3/uL (ref 0.1–1.0)
Neutro Abs: 2.7 10*3/uL (ref 1.4–7.7)
Neutrophils Relative %: 60.7 % (ref 43.0–77.0)
Platelets: 203 10*3/uL (ref 150.0–400.0)
RBC: 4.54 Mil/uL (ref 3.87–5.11)
RDW: 15.2 % (ref 11.5–15.5)
WBC: 4.4 10*3/uL (ref 4.0–10.5)

## 2018-06-06 LAB — MICROALBUMIN / CREATININE URINE RATIO
Creatinine,U: 26.2 mg/dL
Microalb Creat Ratio: 2.7 mg/g (ref 0.0–30.0)

## 2018-06-06 LAB — BASIC METABOLIC PANEL
BUN: 15 mg/dL (ref 6–23)
CALCIUM: 10.9 mg/dL — AB (ref 8.4–10.5)
CO2: 30 meq/L (ref 19–32)
Chloride: 107 mEq/L (ref 96–112)
Creatinine, Ser: 0.58 mg/dL (ref 0.40–1.20)
GFR: 105.11 mL/min (ref >60.00)
GLUCOSE: 131 mg/dL — AB (ref 70–99)
Potassium: 5.7 mEq/L — ABNORMAL HIGH (ref 3.5–5.1)
SODIUM: 141 meq/L (ref 135–145)

## 2018-06-06 LAB — TSH: TSH: 2.09 u[IU]/mL (ref 0.35–4.50)

## 2018-06-06 LAB — HEMOGLOBIN A1C: Hgb A1c MFr Bld: 5.3 % (ref 4.6–6.5)

## 2018-06-06 NOTE — Assessment & Plan Note (Signed)
  DM: Diet controlled, check A1c, micro, BMP. Neuropathy: Follow-up by neurology.  Feet care discussed Depression insomnia: Currently on Cymbalta, Ambien. She lost her older sister and older brother this year, she was close to them.  She is obviously affected.  Denies suicidal ideas.  We had a long conversation about the issue, provided counseling the best I could.  Encouraged to reach out to family, friends and to me if she feels medication needs to be adjusted. --Check CBC and TSH --Shingrix prescription provided RTC 4 to 6 months

## 2018-06-07 NOTE — Addendum Note (Signed)
Addended byDamita Dunnings D on: 06/07/2018 09:09 AM   Modules accepted: Orders

## 2018-06-11 ENCOUNTER — Other Ambulatory Visit (INDEPENDENT_AMBULATORY_CARE_PROVIDER_SITE_OTHER): Payer: Medicare Other

## 2018-06-11 DIAGNOSIS — E875 Hyperkalemia: Secondary | ICD-10-CM | POA: Diagnosis not present

## 2018-06-11 LAB — BASIC METABOLIC PANEL
BUN: 15 mg/dL (ref 6–23)
CALCIUM: 10.4 mg/dL (ref 8.4–10.5)
CO2: 31 mEq/L (ref 19–32)
CREATININE: 0.55 mg/dL (ref 0.40–1.20)
Chloride: 104 mEq/L (ref 96–112)
GFR: 111.75 mL/min (ref >60.00)
GLUCOSE: 142 mg/dL — AB (ref 70–99)
Potassium: 5.1 mEq/L (ref 3.5–5.1)
SODIUM: 139 meq/L (ref 135–145)

## 2018-07-23 DIAGNOSIS — M150 Primary generalized (osteo)arthritis: Secondary | ICD-10-CM | POA: Diagnosis not present

## 2018-07-23 DIAGNOSIS — G4719 Other hypersomnia: Secondary | ICD-10-CM | POA: Diagnosis not present

## 2018-07-23 DIAGNOSIS — F329 Major depressive disorder, single episode, unspecified: Secondary | ICD-10-CM | POA: Diagnosis not present

## 2018-07-23 DIAGNOSIS — G603 Idiopathic progressive neuropathy: Secondary | ICD-10-CM | POA: Diagnosis not present

## 2018-09-04 DIAGNOSIS — H02834 Dermatochalasis of left upper eyelid: Secondary | ICD-10-CM | POA: Diagnosis not present

## 2018-09-04 DIAGNOSIS — E119 Type 2 diabetes mellitus without complications: Secondary | ICD-10-CM | POA: Diagnosis not present

## 2018-09-04 DIAGNOSIS — H40053 Ocular hypertension, bilateral: Secondary | ICD-10-CM | POA: Diagnosis not present

## 2018-09-04 DIAGNOSIS — H02831 Dermatochalasis of right upper eyelid: Secondary | ICD-10-CM | POA: Diagnosis not present

## 2018-09-04 LAB — HM DIABETES EYE EXAM

## 2018-09-06 ENCOUNTER — Encounter: Payer: Self-pay | Admitting: Internal Medicine

## 2018-11-12 ENCOUNTER — Ambulatory Visit: Payer: Self-pay | Admitting: Internal Medicine

## 2019-01-21 DIAGNOSIS — G603 Idiopathic progressive neuropathy: Secondary | ICD-10-CM | POA: Diagnosis not present

## 2019-01-21 DIAGNOSIS — G4719 Other hypersomnia: Secondary | ICD-10-CM | POA: Diagnosis not present

## 2019-01-21 DIAGNOSIS — F329 Major depressive disorder, single episode, unspecified: Secondary | ICD-10-CM | POA: Diagnosis not present

## 2019-01-21 DIAGNOSIS — R296 Repeated falls: Secondary | ICD-10-CM | POA: Diagnosis not present

## 2019-03-27 DIAGNOSIS — H02831 Dermatochalasis of right upper eyelid: Secondary | ICD-10-CM | POA: Diagnosis not present

## 2019-03-27 DIAGNOSIS — H02834 Dermatochalasis of left upper eyelid: Secondary | ICD-10-CM | POA: Diagnosis not present

## 2019-03-27 DIAGNOSIS — H40013 Open angle with borderline findings, low risk, bilateral: Secondary | ICD-10-CM | POA: Diagnosis not present

## 2019-03-27 DIAGNOSIS — H40053 Ocular hypertension, bilateral: Secondary | ICD-10-CM | POA: Diagnosis not present

## 2019-03-27 LAB — HM DIABETES EYE EXAM

## 2019-04-07 NOTE — Progress Notes (Signed)
Virtual Visit via Video Note  I connected with patient on 04/08/19 at  1:00 PM EDT by audio enabled telemedicine application and verified that I am speaking with the correct person using two identifiers.   THIS ENCOUNTER IS A VIRTUAL VISIT DUE TO COVID-19 - PATIENT WAS NOT SEEN IN THE OFFICE. PATIENT HAS CONSENTED TO VIRTUAL VISIT / TELEMEDICINE VISIT   Location of patient: home  Location of provider: office  I discussed the limitations of evaluation and management by telemedicine and the availability of in person appointments. The patient expressed understanding and agreed to proceed.   Subjective:   Sierra Klein is a 85 y.o. female who presents for Medicare Annual (Subsequent) preventive examination.  Review of Systems:  Cardiac Risk Factors include: advanced age (>18men, >85 women);diabetes mellitus;dyslipidemia Home Safety/Smoke Alarms: Feels safe in home. Smoke alarms in place.  Lives alone in 2 story home. Stays on 1st floor mainly. Uses cane on occasion if on "rough ground". No trouble navigating stairs. One of her 2 children call her daily. Walk in shower w/ grab bar. Verbalizes she has a great support system.  Female:     Mammo-  declines     Dexa scan- 07/20/17           Objective:     Vitals: Unable to assess. This visit is enabled though telemedicine due to Covid 19.   Advanced Directives 04/08/2019 04/04/2018 06/28/2016  Does Patient Have a Medical Advance Directive? Yes Yes Yes  Type of Advance Directive Living will [place];Living will Living will  Does patient want to make changes to medical advance directive? No - Patient declined - -  Copy of [place] in Chart? - No - copy requested -    Tobacco Social History   Tobacco Use  Smoking Status Former Smoker  . Types: Cigarettes  . Quit date: 06/28/1954  . Years since quitting: 64.8  Smokeless Tobacco Never Used     Counseling given: Not Answered   Clinical  Intake: Pain : No/denies pain    Past Medical History:  Diagnosis Date  . B12 deficiency anemia   . Cholelithiasis   . Colon polyps   . Diabetes mellitus   . Disturbance, sleep    narcolepsy  . Diverticulitis   . Hiatal hernia   . Hyperlipidemia    started med 6/09  . 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 Osteoarthritis    Past Surgical History:  Procedure Laterality Date  . ABDOMINAL HYSTERECTOMY     no oophorectomy  . BELPHAROPTOSIS REPAIR    . BREAST BIOPSY  09/1983   left  . CATARACT EXTRACTION     l- 9/09, r- 3/10  . CLOSED REDUCTION METACARPAL WITH PERCUTANEOUS PINNING Right 01/12/2015   Procedure: PINNING RIGHT 4 & 5 METACARPAL FRACTURES;  Surgeon: Milly Jakob, MD;  Location: [facility];  Service: Orthopedics;  Laterality: Right;  . FOOT SURGERY  03/1988   cyst removed from right heel  . LAPAROSCOPIC LYSIS OF ADHESIONS  2013    RLQ pain resolved after surgery  . pelvic surgery  2003   cysto-recto-enetero cele repair    Family History  Problem Relation Age of Onset  . Coronary artery disease Sister   . Heart disease Sister   . Coronary artery disease Mother        tachycardia  . Alzheimer's disease Mother   . Coronary  artery disease Brother        x 2 brothers  . Hypertension Brother        x 2 brothers  . Hyperlipidemia Brother   . Hypertension Brother   . Diabetes Other        numerous cousins  . Colon cancer Neg Hx   . Breast cancer Neg Hx   . Ovarian cancer Neg Hx    Social History   Socioeconomic History  . Marital status: Widowed    Spouse name: Not on file  . Number of children: 2  . Years of education: Not on file  . Highest education level: Not on file  Occupational History  . Occupation: retired    Fish farm manager: RETIRED  Social Needs  . Financial resource strain: Not on file  . Food insecurity    Worry: Not on file    Inability: Not on file  .  Transportation needs    Medical: Not on file    Non-medical: Not on file  Tobacco Use  . Smoking status: Former Smoker    Types: Cigarettes    Quit date: 06/28/1954    Years since quitting: 64.8  . Smokeless tobacco: Never Used  Substance and Sexual Activity  . Alcohol use: Yes    Alcohol/week: 4.0 standard drinks    Types: 4 Glasses of wine per week    Comment: 3-4x/week  . Drug use: No  . Sexual activity: Never  Lifestyle  . Physical activity    Days per week: Not on file    Minutes per session: Not on file  . Stress: Not on file  Relationships  . Social Herbalist on phone: Not on file    Gets together: Not on file    Attends religious service: Not on file    Active member of club or organization: Not on file    Attends meetings of clubs or organizations: Not on file    Relationship status: Not on file  Other Topics Concern  . Not on file  Social History Narrative   Married x 67 years, lost husband to a pulm emboli 02-2015    Still drives, independent    Children live @  13 and [place]    2 children, 2 G-children, 1 GG-children   Lost a brother 2011, lost sister and brother 2019. She is the only survivor     Outpatient Encounter Medications as of 04/08/2019  Medication Sig  . Armodafinil (NUVIGIL) 150 MG tablet Take 150 mg by mouth daily as needed.  Marland Kitchen aspirin EC 81 MG tablet Take 81 mg by mouth daily.  . calcium carbonate 1250 MG capsule Take 1,250 mg by mouth 2 (two) times daily with a meal.  . Cholecalciferol (VITAMIN D-3 PO) Take 1 capsule by mouth daily.    . cyanocobalamin 1000 MCG tablet Take 100 mcg by mouth daily.    . diclofenac sodium (VOLTAREN) 1 % GEL Apply 2 g topically as needed.  . diphenoxylate-atropine (LOMOTIL) 2.5-0.025 MG tablet TAKE 1 TABLET BY MOUTH THREE TIMES DAILY AS NEEDED FOR DIARRHEA  . DULoxetine (CYMBALTA) 60 MG capsule Take 60 mg by mouth daily.   Marland Kitchen gabapentin (NEURONTIN) 600 MG tablet Take 600 mg by mouth 3 (three) times  daily.   Marland Kitchen HYDROcodone-acetaminophen (NORCO) 5-325 MG per tablet Take by mouth.  . multivitamin (THERAGRAN) per tablet Take 1 tablet by mouth daily.    . timolol (BETIMOL) 0.5 % ophthalmic solution Place 1 drop into  both eyes daily.  Marland Kitchen zolpidem (AMBIEN) 10 MG tablet Take 5 mg by mouth at bedtime as needed.     No facility-administered encounter medications on file as of 04/08/2019.     Activities of Daily Living In your present state of health, do you have any difficulty performing the following activities: 04/08/2019  Hearing? N  Vision? N  Difficulty concentrating or making decisions? N  Walking or climbing stairs? N  Dressing or bathing? N  Doing errands, shopping? N  Preparing Food and eating ? N  Using the Toilet? N  In the past six months, have you accidently leaked urine? N  Do you have problems with loss of bowel control? N  Managing your Medications? N  Managing your Finances? N  Housekeeping or managing your Housekeeping? N  Some recent data might be hidden    Patient Care Team: Colon Branch, MD as PCP - General Henrene Pastor Docia Chuck, MD as Consulting Physician (Gastroenterology) Leland Johns., MD as Referring Physician (Neurology) Shon Hough, MD as Consulting Physician (Ophthalmology)    Assessment:   This is a routine wellness examination for Zakeria. Physical assessment deferred to PCP.  Exercise Activities and Dietary recommendations Current Exercise Habits: The patient does not participate in regular exercise at present, Exercise limited by: None identified Diet (meal preparation, eat out, water intake, caffeinated beverages, dairy products, fruits and vegetables): 24 hr recall Breakfast:[place], egg, and english muffin Lunch: fruit Dinner:  Chicken and vegetables    Goals    . Patient Stated     Continue to eat heart healthy diet (full of fruits, vegetables, whole grains, lean protein, water--limit salt, fat, and sugar intake) and increase physical activity  as tolerated. Continue doing brain stimulating activities (puzzles, reading, adult coloring books, staying active) to keep memory sharp.         Fall Risk Fall Risk  04/08/2019 04/04/2018 07/10/2017 06/28/2016 11/30/2015  Falls in the past year? 1 No No No No  Number falls in past yr: 0 - - - -  Injury with Fall? 0 - - - -  Comment - - - - -  Follow up - - - - -    Depression Screen PHQ 2/9 Scores 04/08/2019 06/05/2018 04/04/2018 07/10/2017  PHQ - 2 Score 0 1 1 0  PHQ- 9 Score - 1 - -     Cognitive Function Ad8 score reviewed for issues:  Issues making decisions:no  Less interest in hobbies / activities:no  Repeats questions, stories (family complaining):no  Trouble using ordinary gadgets (microwave, computer, phone):no  Forgets the month or year: no  Mismanaging finances: no  Remembering appts:no  Daily problems with thinking and/or memory:no Ad8 score is=0     MMSE - Mini Mental State Exam 06/28/2016  Orientation to time 5  Orientation to Place 5  Registration 3  Attention/ Calculation 5  Recall 3  Language- name 2 objects 2  Language- repeat 1  Language- follow 3 step command 3  Language- read & follow direction 1  Write a sentence 1  Copy design 1  Total score 30        Immunization History  Administered Date(s) Administered  . Influenza Split 04/10/2011, 04/18/2012  . Influenza Whole 05/03/2007, 04/22/2008, 04/14/2009, 04/13/2010  . Influenza, High Dose Seasonal PF 05/13/2013, 05/25/2015, 03/14/2016, 04/04/2018  . Influenza,inj,Quad PF,6+ Mos 03/27/2014  . Influenza-Unspecified 05/08/2017  . Pneumococcal Conjugate-13 02/11/2014  . Pneumococcal Polysaccharide-23 04/26/2006, 07/10/2017  . Td 04/26/2006, 06/28/2016  . Zoster 10/09/2006  Screening Tests Health Maintenance  Topic Date Due  . FOOT EXAM  07/10/2018  . HEMOGLOBIN A1C  12/05/2018  . INFLUENZA VACCINE  01/25/2019  . URINE MICROALBUMIN  06/06/2019  . OPHTHALMOLOGY EXAM  09/04/2019   . TETANUS/TDAP  06/28/2026  . DEXA SCAN  Completed  . PNA vac Low Risk Adult  Completed      Plan:   See you next year!  Continue to eat heart healthy diet (full of fruits, vegetables, whole grains, lean protein, water--limit salt, fat, and sugar intake) and increase physical activity as tolerated.  Continue doing brain stimulating activities (puzzles, reading, adult coloring books, staying active) to keep memory sharp.   Bring a copy of your living will and/or healthcare power of attorney to your next office visit.   I have personally reviewed and noted the following in the patient's chart:   . Medical and social history . Use of alcohol, tobacco or illicit drugs  . Current medications and supplements . Functional ability and status . Nutritional status . Physical activity . Advanced directives . List of other physicians . Hospitalizations, surgeries, and ER visits in previous 12 months . Vitals . Screenings to include cognitive, depression, and falls . Referrals and appointments  In addition, I have reviewed and discussed with patient certain preventive protocols, quality metrics, and best practice recommendations. A written personalized care plan for preventive services as well as general preventive health recommendations were provided to patient.     Shela Nevin, [place]  04/08/2019

## 2019-04-08 ENCOUNTER — Other Ambulatory Visit: Payer: Self-pay

## 2019-04-08 ENCOUNTER — Encounter: Payer: Self-pay | Admitting: *Deleted

## 2019-04-08 ENCOUNTER — Ambulatory Visit (INDEPENDENT_AMBULATORY_CARE_PROVIDER_SITE_OTHER): Payer: Medicare Other | Admitting: *Deleted

## 2019-04-08 DIAGNOSIS — Z0000 Encounter for general adult medical examination without abnormal findings: Secondary | ICD-10-CM | POA: Diagnosis not present

## 2019-04-08 NOTE — Patient Instructions (Signed)
See you next year!  Continue to eat heart healthy diet (full of fruits, vegetables, whole grains, lean protein, water--limit salt, fat, and sugar intake) and increase physical activity as tolerated.  Continue doing brain stimulating activities (puzzles, reading, adult coloring books, staying active) to keep memory sharp.   Bring a copy of your living will and/or healthcare power of attorney to your next office visit.   Sierra Klein , Thank you for taking time to come for your Medicare Wellness Visit. I appreciate your ongoing commitment to your health goals. Please review the following plan we discussed and let me know if I can assist you in the future.   These are the goals we discussed: Goals    . Patient Stated     Continue to eat heart healthy diet (full of fruits, vegetables, whole grains, lean protein, water--limit salt, fat, and sugar intake) and increase physical activity as tolerated. Continue doing brain stimulating activities (puzzles, reading, adult coloring books, staying active) to keep memory sharp.         This is a list of the screening recommended for you and due dates:  Health Maintenance  Topic Date Due  . Complete foot exam   07/10/2018  . Hemoglobin A1C  12/05/2018  . Flu Shot  01/25/2019  . Urine Protein Check  06/06/2019  . Eye exam for diabetics  09/04/2019  . Tetanus Vaccine  06/28/2026  . DEXA scan (bone density measurement)  Completed  . Pneumonia vaccines  Completed    Health Maintenance After Age 32 After age 107, you are at a higher risk for certain long-term diseases and infections as well as injuries from falls. Falls are a major cause of broken bones and head injuries in people who are older than age 64. Getting regular preventive care can help to keep you healthy and well. Preventive care includes getting regular testing and making lifestyle changes as recommended by your health care provider. Talk with your health care provider about:  Which  screenings and tests you should have. A screening is a test that checks for a disease when you have no symptoms.  A diet and exercise plan that is right for you. What should I know about screenings and tests to prevent falls? Screening and testing are the best ways to find a health problem early. Early diagnosis and treatment give you the best chance of managing medical conditions that are common after age 67. Certain conditions and lifestyle choices may make you more likely to have a fall. Your health care provider may recommend:  Regular vision checks. Poor vision and conditions such as cataracts can make you more likely to have a fall. If you wear glasses, make sure to get your prescription updated if your vision changes.  Medicine review. Work with your health care provider to regularly review all of the medicines you are taking, including over-the-counter medicines. Ask your health care provider about any side effects that may make you more likely to have a fall. Tell your health care provider if any medicines that you take make you feel dizzy or sleepy.  Osteoporosis screening. Osteoporosis is a condition that causes the bones to get weaker. This can make the bones weak and cause them to break more easily.  Blood pressure screening. Blood pressure changes and medicines to control blood pressure can make you feel dizzy.  Strength and balance checks. Your health care provider may recommend certain tests to check your strength and balance while standing, walking, or  changing positions.  Foot health exam. Foot pain and numbness, as well as not wearing proper footwear, can make you more likely to have a fall.  Depression screening. You may be more likely to have a fall if you have a fear of falling, feel emotionally low, or feel unable to do activities that you used to do.  Alcohol use screening. Using too much alcohol can affect your balance and may make you more likely to have a fall. What  actions can I take to lower my risk of falls? General instructions  Talk with your health care provider about your risks for falling. Tell your health care provider if: ? You fall. Be sure to tell your health care provider about all falls, even ones that seem minor. ? You feel dizzy, sleepy, or off-balance.  Take over-the-counter and prescription medicines only as told by your health care provider. These include any supplements.  Eat a healthy diet and maintain a healthy weight. A healthy diet includes low-fat dairy products, low-fat (lean) meats, and fiber from whole grains, beans, and lots of fruits and vegetables. Home safety  Remove any tripping hazards, such as rugs, cords, and clutter.  Install safety equipment such as grab bars in bathrooms and safety rails on stairs.  Keep rooms and walkways well-lit. Activity   Follow a regular exercise program to stay fit. This will help you maintain your balance. Ask your health care provider what types of exercise are appropriate for you.  If you need a cane or walker, use it as recommended by your health care provider.  Wear supportive shoes that have nonskid soles. Lifestyle  Do not drink alcohol if your health care provider tells you not to drink.  If you drink alcohol, limit how much you have: ? 0-1 drink a day for women. ? 0-2 drinks a day for men.  Be aware of how much alcohol is in your drink. In the [place] one drink equals one typical bottle of beer (12 oz), one-half glass of wine (5 oz), or one shot of hard liquor (1 oz).  Do not use any products that contain nicotine or tobacco, such as cigarettes and e-cigarettes. If you need help quitting, ask your health care provider. Summary  Having a healthy lifestyle and getting preventive care can help to protect your health and wellness after age 66.  Screening and testing are the best way to find a health problem early and help you avoid having a fall. Early diagnosis and  treatment give you the best chance for managing medical conditions that are more common for people who are older than age 53.  Falls are a major cause of broken bones and head injuries in people who are older than age 77. Take precautions to prevent a fall at home.  Work with your health care provider to learn what changes you can make to improve your health and wellness and to prevent falls. This information is not intended to replace advice given to you by your health care provider. Make sure you discuss any questions you have with your health care provider. Document Released: 04/25/2017 Document Revised: 10/03/2018 Document Reviewed: 04/25/2017 Elsevier Patient Education  2020 [employer].

## 2019-05-01 DIAGNOSIS — Z23 Encounter for immunization: Secondary | ICD-10-CM | POA: Diagnosis not present

## 2019-07-20 ENCOUNTER — Ambulatory Visit: Payer: Medicare Other | Attending: Internal Medicine

## 2019-07-20 ENCOUNTER — Other Ambulatory Visit: Payer: Self-pay

## 2019-07-20 DIAGNOSIS — Z23 Encounter for immunization: Secondary | ICD-10-CM | POA: Insufficient documentation

## 2019-07-20 NOTE — Progress Notes (Signed)
   Covid-19 Vaccination Clinic  Name:  Sierra Klein    MRN: [id number] DOB: 1932/01/25  07/20/2019  [name] was observed post Covid-19 immunization for 15 minutes without incidence. She was provided with Vaccine Information Sheet and instruction to access the V-Safe system.   [name] was instructed to call 911 with any severe reactions post vaccine: Marland Kitchen Difficulty breathing  . Swelling of your face and throat  . A fast heartbeat  . A bad rash all over your body  . Dizziness and weakness    Immunizations Administered    Name Date Dose VIS Date Route   Pfizer COVID-19 Vaccine 07/20/2019 12:07 PM 0.3 mL 06/06/2019 Intramuscular   Manufacturer: [place]   Lot: [id number]   [place]: [id number]

## 2019-07-25 DIAGNOSIS — G4719 Other hypersomnia: Secondary | ICD-10-CM | POA: Diagnosis not present

## 2019-07-25 DIAGNOSIS — Z9181 History of falling: Secondary | ICD-10-CM | POA: Diagnosis not present

## 2019-07-25 DIAGNOSIS — G603 Idiopathic progressive neuropathy: Secondary | ICD-10-CM | POA: Diagnosis not present

## 2019-08-11 ENCOUNTER — Other Ambulatory Visit: Payer: Self-pay

## 2019-08-11 ENCOUNTER — Emergency Department (HOSPITAL_BASED_OUTPATIENT_CLINIC_OR_DEPARTMENT_OTHER): Payer: Medicare Other

## 2019-08-11 ENCOUNTER — Emergency Department (HOSPITAL_BASED_OUTPATIENT_CLINIC_OR_DEPARTMENT_OTHER)
Admission: EM | Admit: 2019-08-11 | Discharge: 2019-08-11 | Disposition: A | Discharge status: Home / Self Care | Payer: Medicare Other | Attending: Emergency Medicine | Admitting: Emergency Medicine

## 2019-08-11 ENCOUNTER — Ambulatory Visit: Payer: Medicare Other | Attending: Internal Medicine

## 2019-08-11 ENCOUNTER — Encounter (HOSPITAL_BASED_OUTPATIENT_CLINIC_OR_DEPARTMENT_OTHER): Payer: Self-pay | Admitting: *Deleted

## 2019-08-11 DIAGNOSIS — Z87891 Personal history of nicotine dependence: Secondary | ICD-10-CM | POA: Diagnosis not present

## 2019-08-11 DIAGNOSIS — Y9289 Other specified places as the place of occurrence of the external cause: Secondary | ICD-10-CM | POA: Insufficient documentation

## 2019-08-11 DIAGNOSIS — Z79899 Other long term (current) drug therapy: Secondary | ICD-10-CM | POA: Diagnosis not present

## 2019-08-11 DIAGNOSIS — Z9181 History of falling: Secondary | ICD-10-CM | POA: Diagnosis not present

## 2019-08-11 DIAGNOSIS — W19XXXA Unspecified fall, initial encounter: Secondary | ICD-10-CM | POA: Insufficient documentation

## 2019-08-11 DIAGNOSIS — Z23 Encounter for immunization: Secondary | ICD-10-CM | POA: Insufficient documentation

## 2019-08-11 DIAGNOSIS — Z7982 Long term (current) use of aspirin: Secondary | ICD-10-CM | POA: Insufficient documentation

## 2019-08-11 DIAGNOSIS — J45909 Unspecified asthma, uncomplicated: Secondary | ICD-10-CM | POA: Diagnosis not present

## 2019-08-11 DIAGNOSIS — Y999 Unspecified external cause status: Secondary | ICD-10-CM | POA: Insufficient documentation

## 2019-08-11 DIAGNOSIS — G6289 Other specified polyneuropathies: Secondary | ICD-10-CM | POA: Insufficient documentation

## 2019-08-11 DIAGNOSIS — M542 Cervicalgia: Secondary | ICD-10-CM | POA: Diagnosis not present

## 2019-08-11 DIAGNOSIS — S0003XA Contusion of scalp, initial encounter: Secondary | ICD-10-CM | POA: Insufficient documentation

## 2019-08-11 DIAGNOSIS — S0100XA Unspecified open wound of scalp, initial encounter: Secondary | ICD-10-CM | POA: Diagnosis not present

## 2019-08-11 DIAGNOSIS — R519 Headache, unspecified: Secondary | ICD-10-CM | POA: Diagnosis not present

## 2019-08-11 DIAGNOSIS — R296 Repeated falls: Secondary | ICD-10-CM | POA: Diagnosis not present

## 2019-08-11 DIAGNOSIS — Y9389 Activity, other specified: Secondary | ICD-10-CM | POA: Diagnosis not present

## 2019-08-11 DIAGNOSIS — S0990XA Unspecified injury of head, initial encounter: Secondary | ICD-10-CM | POA: Diagnosis not present

## 2019-08-11 DIAGNOSIS — S199XXA Unspecified injury of neck, initial encounter: Secondary | ICD-10-CM | POA: Diagnosis not present

## 2019-08-11 MED ORDER — BACITRACIN ZINC 500 UNIT/GM EX OINT: TOPICAL_OINTMENT | Freq: Two times a day (BID) | CUTANEOUS | Status: DC

## 2019-08-11 NOTE — ED Provider Notes (Signed)
Medical screening examination/treatment/procedure(s) were conducted as a shared visit with non-physician practitioner(s) and myself.  I personally evaluated the patient during the encounter.    Patient reports that she often loses her balance of the ground is uneven.  She uses a cane outside.  She knows she has imbalance and falls relatively easily.  She reports she had gone about 6 months till her most recent falls.  She denies any pain.  She does have an abrasion to the top of her head.  She had seen her neurologist this week.  Patient is alert and appropriate.  She has an abrasion to the top of the head.  No active bleeding or hematoma.  Her mental status alert clear.  No respiratory distress.  All movements coordinated purposeful symmetric.  I agree with plan of management.   Charlesetta Shanks, MD 08/11/19 [phone]

## 2019-08-11 NOTE — ED Triage Notes (Signed)
C/o fall x 3 days ago with head injury , sent here from [facility] for CT scan

## 2019-08-11 NOTE — Discharge Instructions (Signed)
Warm soapy water run over her scalp contusion.  May continue to place Neosporin or bacitracin to this.  Return to emergency department for any new or worsening symptoms.

## 2019-08-11 NOTE — Progress Notes (Signed)
   Covid-19 Vaccination Clinic  Name:  Sierra Klein    MRN: [id number] DOB: September 02, 1931  08/11/2019  [name] was observed post Covid-19 immunization for 15 minutes without incidence. She was provided with Vaccine Information Sheet and instruction to access the V-Safe system.   [name] was instructed to call 911 with any severe reactions post vaccine: Marland Kitchen Difficulty breathing  . Swelling of your face and throat  . A fast heartbeat  . A bad rash all over your body  . Dizziness and weakness    Immunizations Administered    Name Date Dose VIS Date Route   Pfizer COVID-19 Vaccine 08/11/2019  1:05 PM 0.3 mL 06/06/2019 Intramuscular   Manufacturer: [place]   Lot: [id number]   [place]: [id number]

## 2019-08-11 NOTE — ED Provider Notes (Signed)
[facility] Provider Note   CSN: [id number] Arrival date & time: 08/11/19  1519    History Head Injury  Sierra Klein is a 86 y.o. female with medical history significant for neuropathy, osteoarthritis, diabetes who presents for evaluation mechanical fall.  Patient states she has frequent falls due to her neuropathy.  Had a fall 3 days ago where she hit the top portion of her head.  She went to urgent care today to be evaluated and they sent her here for CT scan.  Patient with no complaints.  She did have a fall 3 weeks ago where she also hit the right side of her head.  Has some old to this area. No prodromal sx prior to falls. No pain. Unsure of last tetanus. No LOC, anticoagulation aside from ASA 81 mg. No HA, vision changes, dental pain, neck pain, neck stiffness, dizziness, weakness, CP, SOB, Abd pain, unilateral weakness, redness, swelling, warmth. Denies additional aggravating or alleviating factors. Has been putting neosporin on head wound  History obtained from patient and past medical records. No interpretor was used.  HPI     Past Medical History:  Diagnosis Date  . B12 deficiency anemia   . Cholelithiasis   . Colon polyps   . Diabetes mellitus   . Disturbance, sleep    narcolepsy  . Diverticulitis   . Hiatal hernia   . Hyperlipidemia    started med 6/09  . 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 Osteoarthritis     Patient Active Problem List   Diagnosis Date Noted  . PCP NOTES >>>>> 05/26/2015  . Anxiety 09/08/2014  . Other malaise and fatigue 11/11/2013  . Abnormal gait 09/02/2013  . Idiopathic progressive polyneuropathy 09/02/2013  . Annual physical exam 11/14/2011  . Calculus of gallbladder without mention of cholecystitis or obstruction 05/17/2011  . Diverticulosis of colon (without mention of hemorrhage) 05/17/2011  . Diarrhea 03/08/2010  . Hyperlipidemia  04/22/2008  . ANEMIA, B12 DEFICIENCY 08/20/2007  . Neuropathy 08/20/2007  . OSTEOARTHRITIS 08/20/2007  . DM II (diabetes mellitus, type II), controlled ([place]) 02/06/2007  . Insomnia 02/06/2007    Past Surgical History:  Procedure Laterality Date  . ABDOMINAL HYSTERECTOMY     no oophorectomy  . BELPHAROPTOSIS REPAIR    . BREAST BIOPSY  09/1983   left  . CATARACT EXTRACTION     l- 9/09, r- 3/10  . CLOSED REDUCTION METACARPAL WITH PERCUTANEOUS PINNING Right 01/12/2015   Procedure: PINNING RIGHT 4 & 5 METACARPAL FRACTURES;  Surgeon: Milly Jakob, MD;  Location: [facility];  Service: Orthopedics;  Laterality: Right;  . FOOT SURGERY  03/1988   cyst removed from right heel  . LAPAROSCOPIC LYSIS OF ADHESIONS  2013    RLQ pain resolved after surgery  . pelvic surgery  2003   cysto-recto-enetero cele repair      OB History   No obstetric history on file.     Family History  Problem Relation Age of Onset  . Coronary artery disease Sister   . Heart disease Sister   . Coronary artery disease Mother        tachycardia  . Alzheimer's disease Mother   . Coronary artery disease Brother        x 2 brothers  . Hypertension Brother        x 2 brothers  . Hyperlipidemia Brother   . Hypertension Brother   .  Diabetes Other        numerous cousins  . Colon cancer Neg Hx   . Breast cancer Neg Hx   . Ovarian cancer Neg Hx     Social History   Tobacco Use  . Smoking status: Former Smoker    Types: Cigarettes    Quit date: 06/28/1954    Years since quitting: 79.1  . Smokeless tobacco: Never Used  Substance Use Topics  . Alcohol use: Yes    Alcohol/week: 4.0 standard drinks    Types: 4 Glasses of wine per week    Comment: 3-4x/week  . Drug use: No    Home Medications Prior to Admission medications   Medication Sig Start Date End Date Taking? Authorizing Provider  Armodafinil (NUVIGIL) 150 MG tablet Take 150 mg by mouth daily as needed. 02/11/13   [provider]  aspirin EC 81 MG tablet Take 81 mg by mouth daily.    [provider]  calcium carbonate 1250 MG capsule Take 1,250 mg by mouth 2 (two) times daily with a meal.    [provider]  Cholecalciferol (VITAMIN D-3 PO) Take 1 capsule by mouth daily.      [provider]  cyanocobalamin 1000 MCG tablet Take 100 mcg by mouth daily.      [provider]  diclofenac sodium (VOLTAREN) 1 % GEL Apply 2 g topically as needed.    [provider]  diphenoxylate-atropine (LOMOTIL) 2.5-0.025 MG tablet TAKE 1 TABLET BY MOUTH THREE TIMES DAILY AS NEEDED FOR DIARRHEA 04/16/18   Zehr, Laban Emperor, PA-C  DULoxetine (CYMBALTA) 60 MG capsule Take 60 mg by mouth daily.     [provider]  gabapentin (NEURONTIN) 600 MG tablet Take 600 mg by mouth 3 (three) times daily.     [provider]  HYDROcodone-acetaminophen (NORCO) 5-325 MG per tablet Take by mouth. 12/15/13   [provider]  multivitamin [facility]) per tablet Take 1 tablet by mouth daily.      [provider]  timolol (BETIMOL) 0.5 % ophthalmic solution Place 1 drop into both eyes daily.    [provider]  zolpidem (AMBIEN) 10 MG tablet Take 5 mg by mouth at bedtime as needed.      [provider]    Allergies    Latex and Sulfonamide derivatives  Review of Systems   Review of Systems  Constitutional: Negative.   HENT: Negative.   Respiratory: Negative.   Cardiovascular: Negative.   Gastrointestinal: Negative.   Genitourinary: Negative.   Musculoskeletal: Negative.   Skin: Positive for wound.  Neurological: Negative.   All other systems reviewed and are negative.   Physical Exam Updated Vital Signs BP 137/80   Pulse 88   Temp 98.1 F (36.7 C) (Oral)   Resp 18   Ht 5\' 5"  (1.651 m)   Wt 66.7 kg   SpO2 99%   BMI 24.46 kg/m   Physical Exam  Physical Exam  Constitutional: Pt is oriented to person, place, and time. Pt appears  well-developed and well-nourished. No distress.  HENT:  Head: Normocephalic and atraumatic.  Mouth/Throat: Oropharynx is clear and moist.  Eyes: Conjunctivae and EOM are normal. Pupils are equal, round, and reactive to light. No scleral icterus.  No horizontal, vertical or rotational nystagmus  Neck: Normal range of motion. Neck supple.  Full active and passive ROM without pain No midline or paraspinal tenderness No nuchal rigidity or meningeal signs  Cardiovascular: Normal rate, regular rhythm and  intact distal pulses.   Pulmonary/Chest: Effort normal and breath sounds normal. No respiratory distress. Pt has no wheezes. No rales.  Abdominal: Soft. Bowel sounds are normal. There is no tenderness. There is no rebound and no guarding. Pelvis stable, non tender to palpation Musculoskeletal: Normal range of motion.  Lymphadenopathy:    No cervical adenopathy.  Neurological: Pt. is alert and oriented to person, place, and time. He has normal reflexes. No cranial nerve deficit.  Exhibits normal muscle tone. Coordination normal.  Mental Status:  Alert, oriented, thought content appropriate. Speech fluent without evidence of aphasia. Able to follow 2 step commands without difficulty.  Cranial Nerves:  II:  Peripheral visual fields grossly normal, pupils equal, round, reactive to light III,IV, VI: ptosis not present, extra-ocular motions intact bilaterally  V,VII: smile symmetric, facial light touch sensation equal VIII: hearing grossly normal bilaterally  IX,X: midline uvula rise  XI: bilateral shoulder shrug equal and strong XII: midline tongue extension  Motor:  5/5 in upper and lower extremities bilaterally including strong and equal grip strength and dorsiflexion/plantar flexion Sensory: Pinprick and light touch normal in all extremities.  Deep Tendon Reflexes: 2+ and symmetric  Cerebellar: normal finger-to-nose with bilateral upper extremities Gait: normal gait and balance CV: distal  pulses palpable throughout   Skin: Skin is warm and dry. No rash noted. Pt is not diaphoretic. Skin tear to midline superior scalp, No bleeding, drainage. Old eccymosis to right zygomatic process  Psychiatric: Pt has a normal mood and affect. Behavior is normal. Judgment and thought content normal.  Nursing note and vitals reviewed. .ED Results / Procedures / Treatments   Labs (all labs ordered are listed, but only abnormal results are displayed) Labs Reviewed - No data to display  EKG None  Radiology CT Head Wo Contrast  Result Date: 08/11/2019 CLINICAL DATA:  Fall 3 days ago, head injury, headache and neck pain EXAM: CT HEAD WITHOUT CONTRAST CT CERVICAL SPINE WITHOUT CONTRAST TECHNIQUE: Multidetector CT imaging of the head and cervical spine was performed following the standard protocol without intravenous contrast. Multiplanar CT image reconstructions of the cervical spine were also generated. COMPARISON:  10/17/2005 FINDINGS: CT HEAD FINDINGS Brain: No evidence of acute infarction, hemorrhage, hydrocephalus, extra-axial collection or mass lesion/mass effect. Periventricular and deep white matter hypodensity. Vascular: No hyperdense vessel or unexpected calcification. Skull: Normal. Negative for fracture or focal lesion. Sinuses/Orbits: No acute finding. Other: None. CT CERVICAL SPINE FINDINGS Alignment: Normal. Skull base and vertebrae: No acute fracture. No primary bone lesion or focal pathologic process. Soft tissues and spinal canal: No prevertebral fluid or swelling. No visible canal hematoma. Disc levels: Mild to moderate multilevel disc space height loss and osteophytosis. Upper chest: Negative. Other: None. IMPRESSION: 1. No acute intracranial pathology. Small-vessel white matter disease. 2. No fracture or static subluxation of the cervical spine Electronically Signed   By: Eddie Candle M.D.   On: 08/11/2019 16:10   CT Cervical Spine Wo Contrast  Result Date: 08/11/2019 CLINICAL DATA:   Fall 3 days ago, head injury, headache and neck pain EXAM: CT HEAD WITHOUT CONTRAST CT CERVICAL SPINE WITHOUT CONTRAST TECHNIQUE: Multidetector CT imaging of the head and cervical spine was performed following the standard protocol without intravenous contrast. Multiplanar CT image reconstructions of the cervical spine were also generated. COMPARISON:  10/17/2005 FINDINGS: CT HEAD FINDINGS Brain: No evidence of acute infarction, hemorrhage, hydrocephalus, extra-axial collection or mass lesion/mass effect. Periventricular and deep white matter hypodensity. Vascular: No hyperdense vessel or unexpected calcification. Skull: Normal.  Negative for fracture or focal lesion. Sinuses/Orbits: No acute finding. Other: None. CT CERVICAL SPINE FINDINGS Alignment: Normal. Skull base and vertebrae: No acute fracture. No primary bone lesion or focal pathologic process. Soft tissues and spinal canal: No prevertebral fluid or swelling. No visible canal hematoma. Disc levels: Mild to moderate multilevel disc space height loss and osteophytosis. Upper chest: Negative. Other: None. IMPRESSION: 1. No acute intracranial pathology. Small-vessel white matter disease. 2. No fracture or static subluxation of the cervical spine Electronically Signed   By: Eddie Candle M.D.   On: 08/11/2019 16:10    Procedures Procedures (including critical care time)  Medications Ordered in ED Medications  bacitracin ointment (has no administration in time range)    ED Course  I have reviewed the triage vital signs and the nursing notes.  Pertinent labs & imaging results that were available during my care of the patient were reviewed by me and considered in my medical decision making (see chart for details).  86 year old presents for evaluation after mechanical fall 3 days ago hitting superior aspect of head. Hx of recurrent falls 2/2 lower extremity neuropathy. No prodromal symptoms. No LOC, anticoagulation. Small skin teat to superior scalp  without bleeding or drainage. Old ecchymosis to right zygomatic process from fall 3-4 weeks ago. No bony step offs, crepitus to face. No septal hematoma, dentition intake. Able to ambulate after fall. Sent from [facility] for head CT. Patient without any complaints on exam. Ambulatory in ED and NV intact, Pelvis stable, non tender to palpation.  CT head/ neck negative for acute abnormality.  Patient wounds cleaned and dressed.  She follow-up outpatient with her PCP.  Ambulatory in ED without difficulty.  The patient has been appropriately medically screened and/or stabilized in the ED. I have low suspicion for any other emergent medical condition which would require further screening, evaluation or treatment in the ED or require inpatient management.  Patient is hemodynamically stable and in no acute distress.  Patient able to ambulate in department prior to ED.  Evaluation does not show acute pathology that would require ongoing or additional emergent interventions while in the emergency department or further inpatient treatment.  I have discussed the diagnosis with the patient and answered all questions.  Pain is been managed while in the emergency department and patient has no further complaints prior to discharge.  Patient is comfortable with plan discussed in room and is stable for discharge at this time.  I have discussed strict return precautions for returning to the emergency department.  Patient was encouraged to follow-up with PCP/specialist refer to at discharge.  Patient seen eval by attending physician, Dr. Johnney Killian who agrees with the treatment, plan and disposition.    MDM Rules/Calculators/A&P                       Final Clinical Impression(s) / ED Diagnoses Final diagnoses:  Fall, initial encounter  Contusion of scalp, initial encounter    Rx / DC Orders ED Discharge Orders    None       Shykeem Resurreccion A, PA-C 08/11/19 1630    Charlesetta Shanks, MD 08/11/19 1647

## 2019-09-24 ENCOUNTER — Other Ambulatory Visit: Payer: Self-pay

## 2019-09-30 ENCOUNTER — Encounter: Payer: Self-pay | Admitting: Internal Medicine

## 2019-09-30 ENCOUNTER — Ambulatory Visit (INDEPENDENT_AMBULATORY_CARE_PROVIDER_SITE_OTHER): Payer: Medicare Other | Admitting: Internal Medicine

## 2019-09-30 ENCOUNTER — Other Ambulatory Visit: Payer: Self-pay

## 2019-09-30 VITALS — BP 136/63 | HR 64 | Temp 97.5°F | Resp 18 | Ht 65.0 in | Wt 148.2 lb

## 2019-09-30 DIAGNOSIS — T452X1A Poisoning by vitamins, accidental (unintentional), initial encounter: Secondary | ICD-10-CM

## 2019-09-30 DIAGNOSIS — M8580 Other specified disorders of bone density and structure, unspecified site: Secondary | ICD-10-CM | POA: Diagnosis not present

## 2019-09-30 DIAGNOSIS — Z780 Asymptomatic menopausal state: Secondary | ICD-10-CM | POA: Diagnosis not present

## 2019-09-30 DIAGNOSIS — E1140 Type 2 diabetes mellitus with diabetic neuropathy, unspecified: Secondary | ICD-10-CM

## 2019-09-30 DIAGNOSIS — E785 Hyperlipidemia, unspecified: Secondary | ICD-10-CM | POA: Diagnosis not present

## 2019-09-30 LAB — LIPID PANEL
Cholesterol: 137 mg/dL (ref 0–200)
HDL: 44.3 mg/dL (ref >39.00)
LDL Cholesterol: 59 mg/dL (ref 0–99)
NonHDL: 92.85
Total CHOL/HDL Ratio: 3
Triglycerides: 168 mg/dL — ABNORMAL HIGH (ref 0.0–149.0)
VLDL: 33.6 mg/dL (ref 0.0–40.0)

## 2019-09-30 LAB — HEMOGLOBIN A1C: Hgb A1c MFr Bld: 5.3 % (ref 4.6–6.5)

## 2019-09-30 LAB — COMPREHENSIVE METABOLIC PANEL
ALT: 126 U/L — ABNORMAL HIGH (ref 0–35)
AST: 103 U/L — ABNORMAL HIGH (ref 0–37)
Albumin: 3.9 g/dL (ref 3.5–5.2)
Alkaline Phosphatase: 324 U/L — ABNORMAL HIGH (ref 39–117)
BUN: 14 mg/dL (ref 6–23)
CO2: 30 mEq/L (ref 19–32)
Calcium: 10.2 mg/dL (ref 8.4–10.5)
Chloride: 104 mEq/L (ref 96–112)
Creatinine, Ser: 0.55 mg/dL (ref 0.40–1.20)
GFR: 104.82 mL/min (ref >60.00)
Glucose, Bld: 150 mg/dL — ABNORMAL HIGH (ref 70–99)
Potassium: 4.8 mEq/L (ref 3.5–5.1)
Sodium: 139 mEq/L (ref 135–145)
Total Bilirubin: 0.7 mg/dL (ref 0.2–1.2)
Total Protein: 5.8 g/dL — ABNORMAL LOW (ref 6.0–8.3)

## 2019-09-30 LAB — CBC WITH DIFFERENTIAL/PLATELET
Basophils Absolute: 0 10*3/uL (ref 0.0–0.1)
Basophils Relative: 0.5 % (ref 0.0–3.0)
Eosinophils Absolute: 0.1 10*3/uL (ref 0.0–0.7)
Eosinophils Relative: 2.3 % (ref 0.0–5.0)
HCT: 38.3 % (ref 36.0–46.0)
Hemoglobin: 12.2 g/dL (ref 12.0–15.0)
Lymphocytes Relative: 22.5 % (ref 12.0–46.0)
Lymphs Abs: 1 10*3/uL (ref 0.7–4.0)
MCHC: 31.8 g/dL (ref 30.0–36.0)
MCV: 90.9 fl (ref 78.0–100.0)
Monocytes Absolute: 0.5 10*3/uL (ref 0.1–1.0)
Monocytes Relative: 10.9 % (ref 3.0–12.0)
Neutro Abs: 2.8 10*3/uL (ref 1.4–7.7)
Neutrophils Relative %: 63.8 % (ref 43.0–77.0)
Platelets: 215 10*3/uL (ref 150.0–400.0)
RBC: 4.22 Mil/uL (ref 3.87–5.11)
RDW: 14.9 % (ref 11.5–15.5)
WBC: 4.3 10*3/uL (ref 4.0–10.5)

## 2019-09-30 LAB — VITAMIN D 25 HYDROXY (VIT D DEFICIENCY, FRACTURES): VITD: 32.96 ng/mL (ref 30.00–100.00)

## 2019-09-30 NOTE — Progress Notes (Signed)
Subjective:    Patient ID: Sierra Klein, female    DOB: [birth year], 86 y.o.   MRN: [id number]  DOS:  09/30/2019 Type of visit - description: Routine visit, last seen 05/2018 In general feeling well. Did have 2 falls in the last few months, she thinks related to neuropathy. Went to the ER 08/11/2019, note reviewed, CT head and neck no acute.    Review of Systems Admit that this is sleeping more than usual, she thinks he is a slightly more depressed due to the quarantine, has not been able to see her family. Denies any suicidal ideas.   Past Medical History:  Diagnosis Date  . B12 deficiency anemia   . Cholelithiasis   . Colon polyps   . Diabetes mellitus   . Disturbance, sleep    narcolepsy  . Diverticulitis   . Hiatal hernia   . Hyperlipidemia    started med 6/09  . Neuropathy 1998   not releated to DM w/u neg (idopatic progressive neuropathy)= f/u Sierra Klein  . Osteoarthritis     Past Surgical History:  Procedure Laterality Date  . ABDOMINAL HYSTERECTOMY     no oophorectomy  . BELPHAROPTOSIS REPAIR    . BREAST BIOPSY  09/1983   left  . CATARACT EXTRACTION     l- 9/09, r- 3/10  . CLOSED REDUCTION METACARPAL WITH PERCUTANEOUS PINNING Right 01/12/2015   Procedure: PINNING RIGHT 4 & 5 METACARPAL FRACTURES;  Surgeon: Sierra Jakob, MD;  Location: [place];  Service: Orthopedics;  Laterality: Right;  . FOOT SURGERY  03/1988   cyst removed from right heel  . LAPAROSCOPIC LYSIS OF ADHESIONS  2013    RLQ pain resolved after surgery  . pelvic surgery  2003   cysto-recto-enetero cele repair     Allergies as of 09/30/2019      Reactions   Latex    Sulfonamide Derivatives Hives      Medication List       Accurate as of September 30, 2019 11:59 PM. If you have any questions, ask your nurse or doctor.        STOP taking these medications   aspirin EC 81 MG tablet Stopped by: Sierra November, MD     TAKE these medications   Ambien 10 MG  tablet Generic drug: zolpidem Take 5 mg by mouth at bedtime as needed.   calcium carbonate 1250 MG capsule Take 1,250 mg by mouth 2 (two) times daily with a meal.   cyanocobalamin 1000 MCG tablet Take 100 mcg by mouth daily.   diclofenac sodium 1 % Gel Commonly known as: VOLTAREN Apply 2 g topically as needed.   diphenoxylate-atropine 2.5-0.025 MG tablet Commonly known as: LOMOTIL TAKE 1 TABLET BY MOUTH THREE TIMES DAILY AS NEEDED FOR DIARRHEA   DULoxetine 60 MG capsule Commonly known as: CYMBALTA Take 60 mg by mouth daily.   gabapentin 600 MG tablet Commonly known as: NEURONTIN Take 600 mg by mouth 3 (three) times daily.   multivitamin per tablet Take 1 tablet by mouth daily.   Norco 5-325 MG tablet Generic drug: HYDROcodone-acetaminophen Take by mouth.   Nuvigil 150 MG tablet Generic drug: Armodafinil Take 150 mg by mouth daily as needed.   timolol 0.5 % ophthalmic solution Commonly known as: BETIMOL Place 1 drop into both eyes daily.   VITAMIN D-3 PO Take 1 capsule by mouth daily.          Objective:   Physical Exam BP 136/63 (BP Location:  Left Arm, Patient Position: Sitting, Cuff Size: Small)   Pulse 64   Temp (!) 97.5 F (36.4 C) (Temporal)   Resp 18   Ht 5\' 5"  (1.651 m)   Wt 148 lb 4 oz (67.2 kg)   SpO2 96%   BMI 24.67 kg/m  General:   Well developed, NAD, BMI noted.  HEENT:  Normocephalic . Face symmetric, atraumatic Lungs:  CTA B Normal respiratory effort, no intercostal retractions, no accessory muscle use. Heart: RRR,  no murmur.  Abdomen:  Not distended, soft, non-tender. No rebound or rigidity.   Skin: Not pale. Not jaundice Lower extremities: Some left leg edema.  At baseline. neurologic:  alert & oriented X3.  Speech normal, gait appropriate for age and unassisted Psych--  Cognition and judgment appear intact.  Cooperative with normal attention span and concentration.  Behavior appropriate. No anxious or depressed  appearing.     Assessment      Assessment DM Hyperlipidemia Depression, insomnia b 12 deficiency Neuro: -H/o vertigo -Narcolepsy Sierra Klein -Neuropathy, 1998, not related to DM, workup negative, idiopathic. Sierra Klein [facility] neurology; hydrocodone per Sierra Klein Chronic L ankle edema DJD   Wrist Fx 12-2014 DEXA 12-07 (-) and 7-10 normal; T score -1.8 (07-2015) GI:   HH; colon polyps;  Diverticulitis;  cholelithiasis per CT 2012  PLAN: DM: Diet controlled, check a A1c, CMP, CBC Hyperkalemia: Potassium has been high before, low potassium diet discussed, checking labs. High cholesterol: Diet controlled, check FLP Neuropathy: Follow-up by neurology, had 2 falls recently, fall prevention discussed, encouraged to use cane. B12 deficiency: Good compliance with medications Depression, insomnia: Has been very lonely due to the Covid quarantine, no suicidal ideas, we talk about possibly increase Cymbalta but we agreed to simply stay in the same medications and reassess in 4 months.  Hardly ever takes Ambien Osteopenia: Last DEXA 2017, recheck a bone density test High vitamin D: Recheck. Preventive care: Had Covid shot x2 RTC 4 months     This visit occurred during the SARS-CoV-2 public health emergency.  Safety protocols were in place, including screening questions prior to the visit, additional usage of staff PPE, and extensive cleaning of exam room while observing appropriate contact time as indicated for disinfecting solutions.

## 2019-09-30 NOTE — Progress Notes (Signed)
Pre visit review using our clinic review tool, if applicable. No additional management support is needed unless otherwise documented below in the visit note. 

## 2019-09-30 NOTE — Patient Instructions (Addendum)
Per our records you are due for an eye exam. Please contact your eye doctor to schedule an appointment. Please have them send copies of your office visit notes to [place]. Our fax number is [phone].     GO TO THE LAB : Get the blood work     [place], please reschedule your appointments Come back for   a checkup in 4 months      Reading food labels  Check the amount of sodium in foods. Choose foods that have less than 300 milligrams (mg) per serving.  Check the ingredient list for phosphorus or potassium-based additives or preservatives.  Check the amount of saturated and trans fat. Limit or avoid these fats as told by your dietitian. ?  Meal planning  Limit the amount of protein from plant and animal sources you eat each day.  Do not add salt to food when cooking or before eating.  Eat meals and snacks at around the same time each day. If you have diabetes:  If you have diabetes (diabetes mellitus) and chronic kidney disease, it is important to keep your blood glucose in the target range recommended by your health care provider. Follow your diabetes management plan. This may include: ? Checking your blood glucose regularly. ? Taking oral medicines, insulin, or both. ? Exercising for at least 30 minutes on 5 or more days each week, or as told by your health care provider. ? Tracking how many servings of carbohydrates you eat at each meal.  You may be given specific guidelines on how much of certain foods and nutrients you may eat, depending on your stage of kidney disease and whether you have high blood pressure (hypertension). Follow your meal plan as told by your dietitian.  Potassium Potassium affects how steadily your heart beats. If too much potassium builds up in your blood, it can cause an irregular heartbeat or even a heart attack. You may need to eat less potassium, depending on your blood potassium levels and the stage of kidney disease. Talk to your  dietitian about how much potassium you may have each day. You may need to limit or avoid foods that are high in potassium, such as:  Milk and soy milk.  Fruits, such as bananas, papaya, apricots, nectarines, melon, prunes, raisins, kiwi, and oranges.  Vegetables, such as potatoes, sweet potatoes, yams, tomatoes, leafy greens, beets, okra, avocado, pumpkin, and winter squash. White and lima beans.  Sodium Sodium, which is found in salt, helps maintain a healthy balance of fluids in your body. Too much sodium can increase your blood pressure and have a negative effect on the function of your heart and lungs. Too much sodium can also cause your body to retain too much fluid, making your kidneys work harder. Most people should have less than 2,300 milligrams (mg) of sodium each day. If you have hypertension, you may need to limit your sodium to 1,500 mg each day. Talk to your dietitian about how much sodium you may have each day. You may need to limit or avoid foods that are high in sodium, such as:  Salt seasonings.  Soy sauce.  Cured and processed meats.  Salted crackers and snack foods.  Fast food.  Canned soups and most canned foods.  Pickled foods.  Vegetable juice.  Boxed mixes or ready-to-eat boxed meals and side dishes. Bottled dressings, sauces, and marinades.

## 2019-10-01 ENCOUNTER — Telehealth: Payer: Self-pay | Admitting: Internal Medicine

## 2019-10-01 DIAGNOSIS — R7989 Other specified abnormal findings of blood chemistry: Secondary | ICD-10-CM

## 2019-10-01 NOTE — Assessment & Plan Note (Signed)
DM: Diet controlled, check a A1c, CMP, CBC Hyperkalemia: Potassium has been high before, low potassium diet discussed, checking labs. High cholesterol: Diet controlled, check FLP Neuropathy: Follow-up by neurology, had 2 falls recently, fall prevention discussed, encouraged to use cane. B12 deficiency: Good compliance with medications Depression, insomnia: Has been very lonely due to the Covid quarantine, no suicidal ideas, we talk about possibly increase Cymbalta but we agreed to simply stay in the same medications and reassess in 4 months.  Hardly ever takes Ambien Osteopenia: Last DEXA 2017, recheck a bone density test High vitamin D: Recheck. Preventive care: Had Covid shot x2 RTC 4 months

## 2019-10-01 NOTE — Telephone Encounter (Signed)
Arrange: Abdominal ultrasound, DX LFTs GI referral, [facility], dx increase LFTs   ==== Previously, alkaline phosphatase was slightly elevated, normal GGT, bone scan was negative. Now alkaline phosphatase is much higher and LFTs are normal. Plan as above. Other tests are very good. Patient called, aware, encouraged to call me if there are questions.

## 2019-10-01 NOTE — Telephone Encounter (Signed)
Order placed for US abdomen and GI referral placed.

## 2019-10-03 ENCOUNTER — Encounter: Payer: Self-pay | Admitting: Internal Medicine

## 2019-10-07 ENCOUNTER — Other Ambulatory Visit: Payer: Self-pay

## 2019-10-07 ENCOUNTER — Ambulatory Visit (HOSPITAL_BASED_OUTPATIENT_CLINIC_OR_DEPARTMENT_OTHER)
Admission: RE | Admit: 2019-10-07 | Discharge: 2019-10-07 | Disposition: A | Discharge status: Home / Self Care | Payer: Medicare Other | Source: Ambulatory Visit | Attending: Internal Medicine | Admitting: Internal Medicine

## 2019-10-07 DIAGNOSIS — Z780 Asymptomatic menopausal state: Secondary | ICD-10-CM | POA: Diagnosis not present

## 2019-10-07 DIAGNOSIS — K8020 Calculus of gallbladder without cholecystitis without obstruction: Secondary | ICD-10-CM | POA: Diagnosis not present

## 2019-10-07 DIAGNOSIS — R7989 Other specified abnormal findings of blood chemistry: Secondary | ICD-10-CM | POA: Insufficient documentation

## 2019-10-07 DIAGNOSIS — K760 Fatty (change of) liver, not elsewhere classified: Secondary | ICD-10-CM | POA: Diagnosis not present

## 2019-10-07 DIAGNOSIS — M85852 Other specified disorders of bone density and structure, left thigh: Secondary | ICD-10-CM | POA: Diagnosis not present

## 2019-10-07 DIAGNOSIS — M8580 Other specified disorders of bone density and structure, unspecified site: Secondary | ICD-10-CM | POA: Diagnosis not present

## 2019-10-29 ENCOUNTER — Other Ambulatory Visit (INDEPENDENT_AMBULATORY_CARE_PROVIDER_SITE_OTHER): Payer: Medicare Other

## 2019-10-29 ENCOUNTER — Encounter: Payer: Self-pay | Admitting: Internal Medicine

## 2019-10-29 ENCOUNTER — Ambulatory Visit (INDEPENDENT_AMBULATORY_CARE_PROVIDER_SITE_OTHER): Payer: Medicare Other | Admitting: Internal Medicine

## 2019-10-29 VITALS — BP 104/48 | HR 77 | Temp 98.3°F | Ht 64.0 in | Wt 145.0 lb

## 2019-10-29 DIAGNOSIS — R7989 Other specified abnormal findings of blood chemistry: Secondary | ICD-10-CM | POA: Diagnosis not present

## 2019-10-29 LAB — GAMMA GT: GGT: 189 U/L — ABNORMAL HIGH (ref 7–51)

## 2019-10-29 LAB — HEPATIC FUNCTION PANEL
ALT: 80 U/L — ABNORMAL HIGH (ref 0–35)
AST: 170 U/L — ABNORMAL HIGH (ref 0–37)
Albumin: 4.4 g/dL (ref 3.5–5.2)
Alkaline Phosphatase: 348 U/L — ABNORMAL HIGH (ref 39–117)
Bilirubin, Direct: 0.3 mg/dL (ref 0.0–0.3)
Total Bilirubin: 0.8 mg/dL (ref 0.2–1.2)
Total Protein: 7.1 g/dL (ref 6.0–8.3)

## 2019-10-29 LAB — IRON: Iron: 60 ug/dL (ref 42–145)

## 2019-10-29 LAB — PROTIME-INR
INR: 0.9 ratio (ref 0.8–1.0)
Prothrombin Time: 10.5 s (ref 9.6–13.1)

## 2019-10-29 LAB — FERRITIN: Ferritin: 22.6 ng/mL (ref 10.0–291.0)

## 2019-10-29 LAB — IBC PANEL
Iron: 60 ug/dL (ref 42–145)
Saturation Ratios: 12 % — ABNORMAL LOW (ref 20.0–50.0)
Transferrin: 356 mg/dL (ref 212.0–360.0)

## 2019-10-29 MED ORDER — DIPHENOXYLATE-ATROPINE 2.5-0.025 MG PO TABS: ORAL_TABLET | ORAL | 2 refills | Status: DC

## 2019-10-29 NOTE — Progress Notes (Signed)
HISTORY OF PRESENT ILLNESS:  Sierra Klein is a 86 y.o. female with past medical history as listed below who was sent today by Dr. Larose Kells regarding abnormal liver test.  Reviewing the patient's chart blood work from 2014 showed a mildly elevated ALT of 37.  Otherwise normal liver test.  Alkaline phosphatase was 97.  In 2016 the alkaline phosphatase was 151.  Other liver tests normal.  In January 2019 the alkaline phosphatase is 168.  Other liver tests normal.  GGT was normal.  September 30, 2019 AST 103, ALT 126, alkaline phosphatase 324, total bilirubin 0.7.  CBC was normal with platelets 215.  Hemoglobin A1c 5.3.  Normal globulins.  Patient is weight has been unchanged over the years.  She did undergo an abdominal ultrasound October 01, 2019 to evaluate her liver test abnormalities.  The ultrasound showed changes consistent with fatty liver.  Cholelithiasis without complication.  Otherwise unremarkable.  Patient has no GI complaints today.  No abdominal pain.  She rarely uses alcohol.  No family history of liver disease.  No personal history of liver disease.  No new medications.  No pruritus.  Colonoscopy in 2012 was unremarkable except for diverticulosis  REVIEW OF SYSTEMS:  All non-GI ROS negative unless otherwise stated in the HPI except for fatigue  Past Medical History:  Diagnosis Date  . B12 deficiency anemia   . Cholelithiasis   . Colon polyps   . Diabetes mellitus   . Disturbance, sleep    narcolepsy  . Diverticulitis   . Hiatal hernia   . Hyperlipidemia    started med 6/09  . 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 Osteoarthritis     Past Surgical History:  Procedure Laterality Date  . ABDOMINAL HYSTERECTOMY     no oophorectomy  . BELPHAROPTOSIS REPAIR    . BREAST BIOPSY  09/1983   left  . CATARACT EXTRACTION     l- 9/09, r- 3/10  . CLOSED REDUCTION METACARPAL WITH PERCUTANEOUS PINNING Right 01/12/2015   Procedure:  PINNING RIGHT 4 & 5 METACARPAL FRACTURES;  Surgeon: Milly Jakob, MD;  Location: [facility];  Service: Orthopedics;  Laterality: Right;  . FOOT SURGERY  03/1988   cyst removed from right heel  . LAPAROSCOPIC LYSIS OF ADHESIONS  2013    RLQ pain resolved after surgery  . pelvic surgery  2003   cysto-recto-enetero cele repair     Social History SEYDI FREDERICKSEN  reports that she quit smoking about 65 years ago. Her smoking use included cigarettes. She has never used smokeless tobacco. She reports current alcohol use of about 4.0 standard drinks of alcohol per week. She reports that she does not use drugs.  family history includes Alzheimer's disease in her mother; Coronary artery disease in her brother, mother, and sister; Diabetes in an other family member; Heart disease in her sister; Hyperlipidemia in her brother; Hypertension in her brother and brother.  Allergies  Allergen Reactions  . Latex   . Sulfonamide Derivatives Hives       PHYSICAL EXAMINATION: Vital signs: BP (!) 104/48   Pulse 77   Temp 98.3 F (36.8 C)   Ht 5\' 4"  (1.626 m)   Wt 145 lb (65.8 kg)   BMI 24.89 kg/m   Constitutional: generally well-appearing, no acute distress Psychiatric: alert and oriented x3, cooperative Eyes: extraocular movements intact, anicteric, conjunctiva pink Mouth: oral pharynx moist, no lesions Neck: supple no  lymphadenopathy Cardiovascular: heart regular rate and rhythm, no murmur Lungs: clear to auscultation bilaterally Abdomen: soft, nontender, nondistended, no obvious ascites, no peritoneal signs, normal bowel sounds, no organomegaly Rectal: Omitted Extremities: no clubbing, cyanosis, or lower extremity edema bilaterally Skin: no lesions on visible extremities Neuro: No focal deficits. No asterixis.    ASSESSMENT:  1.  Liver test abnormalities.  Has had various degrees over the years.  Etiology unclear.  No evidence for chronic liver disease or hepatic synthetic  dysfunction. 2.  Colonoscopy 2012 with diverticulosis 3.  Fatty liver on ultrasound 4.  Incidental cholelithiasis   PLAN:  1.  Laboratories today to evaluate for viral nonviral causes of elevated liver test.  As well celiac testing with a prior history of issues with diarrhea.  Finally, testing to assess for her synthetic function. 2.  We will contact the patient with the results.  If there is a particular abnormality it will be addressed accordingly.  However if no particular abnormality then I would like to see her back for routine follow-up in about 6 months. 3.  Ongoing general medical care with Dr. Larose Kells A total time of 30 minutes was spent preparing to see the patient, reviewing outside laboratories and x-rays.  Obtaining comprehensive history and performing comprehensive physical examination.  Counseling and educating the patient regarding her condition ordering advanced laboratory testing.  Finally documenting all information that is clinical in her health record

## 2019-10-29 NOTE — Patient Instructions (Addendum)
We have sent the following medications to your pharmacy for you to pick up at your convenience:  Lomotil  Your provider has requested that you go to the basement level for lab work before leaving today. Press "B" on the elevator. The lab is located at the first door on the left as you exit the elevator.   Please follow up in 6 months

## 2019-11-05 ENCOUNTER — Other Ambulatory Visit: Payer: Self-pay

## 2019-11-05 DIAGNOSIS — R7989 Other specified abnormal findings of blood chemistry: Secondary | ICD-10-CM

## 2019-11-05 LAB — HEPATITIS B SURFACE ANTIGEN: Hepatitis B Surface Ag: NONREACTIVE

## 2019-11-05 LAB — ANTI-SMOOTH MUSCLE ANTIBODY, IGG: Actin (Smooth Muscle) Antibody (IGG): <20 U (ref <20)

## 2019-11-05 LAB — HEPATITIS B SURFACE ANTIBODY,QUALITATIVE: Hep B S Ab: NONREACTIVE

## 2019-11-05 LAB — ANA: Anti Nuclear Antibody (ANA): POSITIVE — AB

## 2019-11-05 LAB — MITOCHONDRIAL ANTIBODIES: Mitochondrial M2 Ab, IgG: <=20 U

## 2019-11-05 LAB — ANTI-NUCLEAR AB-TITER (ANA TITER): ANA Titer 1: 1:40 {titer} — ABNORMAL HIGH

## 2019-11-05 LAB — CERULOPLASMIN: Ceruloplasmin: 32 mg/dL (ref 18–53)

## 2019-11-05 LAB — HEPATITIS C ANTIBODY
Hepatitis C Ab: NONREACTIVE
SIGNAL TO CUT-OFF: 0.01 (ref <1.00)

## 2019-11-05 LAB — TISSUE TRANSGLUTAMINASE, IGA: (tTG) Ab, IgA: 1 U/mL

## 2019-11-05 LAB — ALPHA-1-ANTITRYPSIN: A-1 Antitrypsin, Ser: 184 mg/dL (ref 83–199)

## 2019-11-11 ENCOUNTER — Telehealth (INDEPENDENT_AMBULATORY_CARE_PROVIDER_SITE_OTHER): Payer: Medicare Other | Admitting: Internal Medicine

## 2019-11-11 ENCOUNTER — Other Ambulatory Visit: Payer: Self-pay

## 2019-11-11 ENCOUNTER — Encounter: Payer: Self-pay | Admitting: Internal Medicine

## 2019-11-11 VITALS — Ht 64.0 in | Wt 139.3 lb

## 2019-11-11 DIAGNOSIS — R05 Cough: Secondary | ICD-10-CM | POA: Diagnosis not present

## 2019-11-11 DIAGNOSIS — R059 Cough, unspecified: Secondary | ICD-10-CM

## 2019-11-11 NOTE — Progress Notes (Signed)
Pre visit review using our clinic review tool, if applicable. No additional management support is needed unless otherwise documented below in the visit note. 

## 2019-11-11 NOTE — Progress Notes (Signed)
Subjective:    Patient ID: Sierra Klein, female    DOB: Nov 10, 1931, 86 y.o.   MRN: [id number]  DOS:  11/11/2019 Type of visit - description: Virtual Visit via Video Note  I connected with the above patient  by a video enabled telemedicine application and verified that I am speaking with the correct person using two identifiers.   THIS ENCOUNTER IS A VIRTUAL VISIT DUE TO COVID-19 - PATIENT WAS NOT SEEN IN THE OFFICE. PATIENT HAS CONSENTED TO VIRTUAL VISIT / TELEMEDICINE VISIT   Location of patient: home  Location of provider: office  I discussed the limitations of evaluation and management by telemedicine and the availability of in person appointments. The patient expressed understanding and agreed to proceed.  Acute Symptoms started approximately 2 weeks ago, initially mild, they are now increasing. Reports dry cough, + chest congestion. Also when asked, admits to a temperature or around 100 degrees on and off.    Review of Systems Admits to some sore throat. No chest pain, mild DOE noting that she is only active within her house. Appetite is not very good, she feels fatigue. No nausea, vomiting, myalgias No lower extremity edema  Past Medical History:  Diagnosis Date  . B12 deficiency anemia   . Cholelithiasis   . Colon polyps   . Diabetes mellitus   . Disturbance, sleep    narcolepsy  . Diverticulitis   . Hiatal hernia   . Hyperlipidemia    started med 6/09  . 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 Osteoarthritis     Past Surgical History:  Procedure Laterality Date  . ABDOMINAL HYSTERECTOMY     no oophorectomy  . BELPHAROPTOSIS REPAIR    . BREAST BIOPSY  09/1983   left  . CATARACT EXTRACTION     l- 9/09, r- 3/10  . CLOSED REDUCTION METACARPAL WITH PERCUTANEOUS PINNING Right 01/12/2015   Procedure: PINNING RIGHT 4 & 5 METACARPAL FRACTURES;  Surgeon: Milly Jakob, MD;  Location: [facility];  Service: Orthopedics;  Laterality: Right;  . FOOT SURGERY  03/1988   cyst removed from right heel  . LAPAROSCOPIC LYSIS OF ADHESIONS  2013    RLQ pain resolved after surgery  . pelvic surgery  2003   cysto-recto-enetero cele repair     Allergies as of 11/11/2019      Reactions   Latex    Sulfonamide Derivatives Hives      Medication List       Accurate as of Nov 11, 2019  4:24 PM. If you have any questions, ask your nurse or doctor.        Ambien 10 MG tablet Generic drug: zolpidem Take 5 mg by mouth at bedtime as needed.   calcium carbonate 1250 MG capsule Take 1,250 mg by mouth 2 (two) times daily with a meal.   cyanocobalamin 1000 MCG tablet Take 100 mcg by mouth daily.   diclofenac sodium 1 % Gel Commonly known as: VOLTAREN Apply 2 g topically as needed.   diphenoxylate-atropine 2.5-0.025 MG tablet Commonly known as: LOMOTIL TAKE 1 TABLET BY MOUTH THREE TIMES DAILY AS NEEDED FOR DIARRHEA   DULoxetine 60 MG capsule Commonly known as: CYMBALTA Take 60 mg by mouth daily.   gabapentin 600 MG tablet Commonly known as: NEURONTIN Take 600 mg by mouth 3 (three) times daily.   multivitamin per tablet Take 1 tablet by mouth daily.   Norco  5-325 MG tablet Generic drug: HYDROcodone-acetaminophen Take by mouth.   Nuvigil 150 MG tablet Generic drug: Armodafinil Take 150 mg by mouth daily as needed.   timolol 0.5 % ophthalmic solution Commonly known as: BETIMOL Place 1 drop into both eyes daily.   VITAMIN D-3 PO Take 1 capsule by mouth daily.          Objective:   Physical Exam Ht 5\' 4"  (1.626 m)   Wt 139 lb 5 oz (63.2 kg)   BMI 23.91 kg/m  This is a virtual video visit, she looks well, alert oriented x3.  I did notice cough with some chest congestion but no distress. Does not have the means to check her blood pressure or O2 sats.    Assessment      Assessment DM Hyperlipidemia Depression, insomnia b 12 deficiency Neuro: -H/o  vertigo -Narcolepsy Dr Bertrum Sol -Neuropathy, 1998, not related to DM, workup negative, idiopathic. Dr Jannifer [facility]; hydrocodone per Dr Jannifer Franklin Chronic L ankle edema DJD   Wrist Fx 12-2014 DEXA 12-07 (-) and 7-10 normal; T score -1.8 (07-2015) GI:   HH; colon polyps;  Diverticulitis;  cholelithiasis per CT 2012  PLAN: Cough, low-grade temperature: As described above, she is 86, she had 2 Covid shots already, last was 07-2019. She needs to be seen in person, likely have a x-ray, basic labs and being checked for Covid. At this point, my office is unable to provide the service, I advised her to go to one of our urgent cares, near [place] that she will go tomorrow first thing in the morning. In the meantime, ER if increased symptoms, chest pain, severe difficulty breathing.     I discussed the assessment and treatment plan with the patient. The patient was provided an opportunity to ask questions and all were answered. The patient agreed with the plan and demonstrated an understanding of the instructions.   The patient was advised to call back or seek an in-person evaluation if the symptoms worsen or if the condition fails to improve as anticipated.

## 2019-11-12 ENCOUNTER — Ambulatory Visit (HOSPITAL_COMMUNITY)
Admission: EM | Admit: 2019-11-12 | Discharge: 2019-11-12 | Disposition: A | Discharge status: Home / Self Care | Payer: Medicare Other | Attending: Emergency Medicine | Admitting: Emergency Medicine

## 2019-11-12 ENCOUNTER — Other Ambulatory Visit: Payer: Self-pay

## 2019-11-12 ENCOUNTER — Encounter (HOSPITAL_COMMUNITY): Payer: Self-pay | Admitting: Family Medicine

## 2019-11-12 ENCOUNTER — Ambulatory Visit (INDEPENDENT_AMBULATORY_CARE_PROVIDER_SITE_OTHER): Payer: Medicare Other

## 2019-11-12 DIAGNOSIS — E785 Hyperlipidemia, unspecified: Secondary | ICD-10-CM | POA: Diagnosis not present

## 2019-11-12 DIAGNOSIS — E119 Type 2 diabetes mellitus without complications: Secondary | ICD-10-CM | POA: Insufficient documentation

## 2019-11-12 DIAGNOSIS — R509 Fever, unspecified: Secondary | ICD-10-CM | POA: Diagnosis not present

## 2019-11-12 DIAGNOSIS — R0602 Shortness of breath: Secondary | ICD-10-CM | POA: Diagnosis not present

## 2019-11-12 DIAGNOSIS — F419 Anxiety disorder, unspecified: Secondary | ICD-10-CM | POA: Diagnosis not present

## 2019-11-12 DIAGNOSIS — Z87891 Personal history of nicotine dependence: Secondary | ICD-10-CM | POA: Insufficient documentation

## 2019-11-12 DIAGNOSIS — Z79899 Other long term (current) drug therapy: Secondary | ICD-10-CM | POA: Insufficient documentation

## 2019-11-12 DIAGNOSIS — Z20822 Contact with and (suspected) exposure to covid-19: Secondary | ICD-10-CM | POA: Diagnosis not present

## 2019-11-12 DIAGNOSIS — M1990 Unspecified osteoarthritis, unspecified site: Secondary | ICD-10-CM | POA: Insufficient documentation

## 2019-11-12 DIAGNOSIS — G4700 Insomnia, unspecified: Secondary | ICD-10-CM | POA: Insufficient documentation

## 2019-11-12 DIAGNOSIS — B349 Viral infection, unspecified: Secondary | ICD-10-CM

## 2019-11-12 DIAGNOSIS — E538 Deficiency of other specified B group vitamins: Secondary | ICD-10-CM | POA: Diagnosis not present

## 2019-11-12 DIAGNOSIS — D649 Anemia, unspecified: Secondary | ICD-10-CM | POA: Insufficient documentation

## 2019-11-12 DIAGNOSIS — R05 Cough: Secondary | ICD-10-CM | POA: Diagnosis not present

## 2019-11-12 MED ORDER — CETIRIZINE HCL 10 MG PO TABS: 10.0000 mg | ORAL_TABLET | Freq: Every day | ORAL | 0 refills | Status: AC

## 2019-11-12 MED ORDER — BENZONATATE 100 MG PO CAPS: 100.0000 mg | ORAL_CAPSULE | Freq: Three times a day (TID) | ORAL | 0 refills | Status: DC

## 2019-11-12 NOTE — ED Provider Notes (Signed)
[place]    CSN: [id number] Arrival date & time: 11/12/19  1104      History   Chief Complaint Chief Complaint  Patient presents with  . Cough  . Fever    HPI Sierra Klein is a 86 y.o. female.   Patient is a 86 year old female with past medical history of B12 deficiency, cholelithiasis, diabetes, diverticulitis, hyperlipidemia, neuropathy, osteoarthritis. She presents today with approximate 2 weeks of nonproductive cough, congestion, hoarseness, fever, runny nose.  Symptoms have been constant, waxing waning.  Highest fever reported at home is 100.  Denies any chest pain or shortness of breath.  She does not smoke.  No recent traveling.  Was Covid vaccinated in February.  No no recent sick contacts or recent traveling.  No abdominal pain, nausea, vomiting or diarrhea. No LE swelling.   ROS per HPI      Past Medical History:  Diagnosis Date  . B12 deficiency anemia   . Cholelithiasis   . Colon polyps   . Diabetes mellitus   . Disturbance, sleep    narcolepsy  . Diverticulitis   . Hiatal hernia   . Hyperlipidemia    started med 6/09  . 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 Osteoarthritis     Patient Active Problem List   Diagnosis Date Noted  . PCP NOTES >>>>> 05/26/2015  . Anxiety 09/08/2014  . Other malaise and fatigue 11/11/2013  . Abnormal gait 09/02/2013  . Idiopathic progressive polyneuropathy 09/02/2013  . Annual physical exam 11/14/2011  . Calculus of gallbladder without mention of cholecystitis or obstruction 05/17/2011  . Diverticulosis of colon (without mention of hemorrhage) 05/17/2011  . Diarrhea 03/08/2010  . Hyperlipidemia 04/22/2008  . ANEMIA, B12 DEFICIENCY 08/20/2007  . Neuropathy 08/20/2007  . OSTEOARTHRITIS 08/20/2007  . DM II (diabetes mellitus, type II), controlled ([place]) 02/06/2007  . Insomnia 02/06/2007    Past Surgical History:  Procedure  Laterality Date  . ABDOMINAL HYSTERECTOMY     no oophorectomy  . BELPHAROPTOSIS REPAIR    . BREAST BIOPSY  09/1983   left  . CATARACT EXTRACTION     l- 9/09, r- 3/10  . CLOSED REDUCTION METACARPAL WITH PERCUTANEOUS PINNING Right 01/12/2015   Procedure: PINNING RIGHT 4 & 5 METACARPAL FRACTURES;  Surgeon: Milly Jakob, MD;  Location: [facility];  Service: Orthopedics;  Laterality: Right;  . FOOT SURGERY  03/1988   cyst removed from right heel  . LAPAROSCOPIC LYSIS OF ADHESIONS  2013    RLQ pain resolved after surgery  . pelvic surgery  2003   cysto-recto-enetero cele repair     OB History   No obstetric history on file.      Home Medications    Prior to Admission medications   Medication Sig Start Date End Date Taking? Authorizing Provider  Armodafinil (NUVIGIL) 150 MG tablet Take 150 mg by mouth daily as needed. 02/11/13   [provider]  benzonatate (TESSALON) 100 MG capsule Take 1 capsule (100 mg total) by mouth every 8 (eight) hours. 11/12/19   Loura Halt A, NP  calcium carbonate 1250 MG capsule Take 1,250 mg by mouth 2 (two) times daily with a meal.    [provider]  cetirizine (ZYRTEC) 10 MG tablet Take 1 tablet (10 mg total) by mouth daily. 11/12/19   Loura Halt A, NP  Cholecalciferol (VITAMIN D-3 PO) Take 1 capsule by mouth daily.  [provider]  cyanocobalamin 1000 MCG tablet Take 100 mcg by mouth daily.      [provider]  diclofenac sodium (VOLTAREN) 1 % GEL Apply 2 g topically as needed.    [provider]  diphenoxylate-atropine (LOMOTIL) 2.5-0.025 MG tablet TAKE 1 TABLET BY MOUTH THREE TIMES DAILY AS NEEDED FOR DIARRHEA 10/29/19   Irene Shipper, MD  DULoxetine (CYMBALTA) 60 MG capsule Take 60 mg by mouth daily.     [provider]  gabapentin (NEURONTIN) 600 MG tablet Take 600 mg by mouth 3 (three) times daily.     [provider]  HYDROcodone-acetaminophen (NORCO) 5-325 MG per  tablet Take by mouth. 12/15/13   [provider]  multivitamin [facility]) per tablet Take 1 tablet by mouth daily.      [provider]  timolol (BETIMOL) 0.5 % ophthalmic solution Place 1 drop into both eyes daily.    [provider]  zolpidem (AMBIEN) 10 MG tablet Take 5 mg by mouth at bedtime as needed.      [provider]    Family History Family History  Problem Relation Age of Onset  . Coronary artery disease Sister   . Heart disease Sister   . Coronary artery disease Mother        tachycardia  . Alzheimer's disease Mother   . Coronary artery disease Brother        x 2 brothers  . Hypertension Brother        x 2 brothers  . Hyperlipidemia Brother   . Hypertension Brother   . Diabetes Other        numerous cousins  . Colon cancer Neg Hx   . Breast cancer Neg Hx   . Ovarian cancer Neg Hx     Social History Social History   Tobacco Use  . Smoking status: Former Smoker    Types: Cigarettes    Quit date: 06/28/1954    Years since quitting: 65.4  . Smokeless tobacco: Never Used  Substance Use Topics  . Alcohol use: Yes    Alcohol/week: 4.0 standard drinks    Types: 4 Glasses of wine per week    Comment: 3-4x/week  . Drug use: No     Allergies   Latex and Sulfonamide derivatives   Review of Systems Review of Systems   Physical Exam Triage Vital Signs ED Triage Vitals  Enc Vitals Group     BP 11/12/19 1204 (!) 143/93     Pulse Rate 11/12/19 1204 (!) 114     Resp 11/12/19 1204 20     Temp 11/12/19 1204 99.4 F (37.4 C)     Temp Source 11/12/19 1204 Oral     SpO2 11/12/19 1204 95 %     Weight --      Height --      Head Circumference --      Peak Flow --      Pain Score 11/12/19 1202 0     Pain Loc --      Pain Edu? --      Excl. in [place]? --    No data found.  Updated Vital Signs BP (!) 143/93 (BP Location: Left Arm)   Pulse (!) 114   Temp 99.4 F (37.4 C) (Oral)   Resp 20   SpO2 95%   Visual Acuity Right  Eye Distance:   Left Eye Distance:   Bilateral Distance:    Right Eye Near:   Left Eye  Near:    Bilateral Near:     Physical Exam Vitals and nursing note reviewed.  Constitutional:      General: She is not in acute distress.    Appearance: Normal appearance. She is not ill-appearing, toxic-appearing or diaphoretic.  HENT:     Head: Normocephalic.     Right Ear: Tympanic membrane and ear canal normal.     Left Ear: Tympanic membrane and ear canal normal.     Nose: Nose normal.     Mouth/Throat:     Pharynx: Oropharynx is clear.     Comments: Laryngitis  Cardiovascular:     Rate and Rhythm: Normal rate and regular rhythm.  Pulmonary:     Effort: Pulmonary effort is normal.     Breath sounds: Normal breath sounds.  Musculoskeletal:        General: Normal range of motion.     Cervical back: Normal range of motion.  Skin:    General: Skin is warm and dry.     Findings: No rash.  Neurological:     Mental Status: She is alert.  Psychiatric:        Mood and Affect: Mood normal.      UC Treatments / Results  Labs (all labs ordered are listed, but only abnormal results are displayed) Labs Reviewed  SARS CORONAVIRUS 2 (TAT 6-24 HRS)    EKG   Radiology DG Chest 2 View  Result Date: 11/12/2019 CLINICAL DATA:  Shortness of breath with cough and wheezing.  Fever. EXAM: CHEST - 2 VIEW COMPARISON:  December 08, 2005 FINDINGS: There is slight left base atelectasis. Lungs elsewhere clear. Heart size and pulmonary vascularity are normal. No adenopathy. There is a sizable hiatal type hernia. There is mild degenerative change in the thoracic spine. IMPRESSION: Slight left base atelectasis. Lungs elsewhere clear. Cardiac silhouette normal. Sizable hiatal type hernia. Electronically Signed   By: Lowella Grip III M.D.   On: 11/12/2019 13:04    Procedures Procedures (including critical care time)  Medications Ordered in UC Medications - No data to display  Initial Impression /  Assessment and Plan / UC Course  I have reviewed the triage vital signs and the nursing notes.  Pertinent labs & imaging results that were available during my care of the patient were reviewed by me and considered in my medical decision making (see chart for details).     Viral illness X-ray without infection or anything worrisome. Covid swab sent for testing with labs pending Tessalon Perles given for cough Recommended starting daily Zyrtec.  This chronic cough could be allergy related. Recommend follow-up with her doctor for any continued or worsening problems Final Clinical Impressions(s) / UC Diagnoses   Final diagnoses:  Viral illness     Discharge Instructions     Your x-ray was normal No concerns for pneumonia at this time. We will give you something for your cough Recommend starting a daily allergy pill Follow up as needed for continued or worsening symptoms     ED Prescriptions    Medication Sig Dispense Auth. Provider   benzonatate (TESSALON) 100 MG capsule Take 1 capsule (100 mg total) by mouth every 8 (eight) hours. 21 capsule Andrena Margerum A, NP   cetirizine (ZYRTEC) 10 MG tablet Take 1 tablet (10 mg total) by mouth daily. 30 tablet Loura Halt A, NP     PDMP not reviewed this encounter.   Loura Halt A, NP 11/12/19 1412

## 2019-11-12 NOTE — Discharge Instructions (Signed)
Your x-ray was normal No concerns for pneumonia at this time. We will give you something for your cough Recommend starting a daily allergy pill Follow up as needed for continued or worsening symptoms

## 2019-11-12 NOTE — ED Triage Notes (Signed)
Pt c/o acute onset non-productive cough, congestion, hoarseness, fever, runny nose for approx 2 weeks. Reports Tmax of 100.0.    Denies abdom pain, n/v/d, SOB.  States has had both COVID vaccines; last dose in mid-February.

## 2019-11-13 LAB — SARS CORONAVIRUS 2 (TAT 6-24 HRS): SARS Coronavirus 2: NEGATIVE

## 2019-11-13 NOTE — Assessment & Plan Note (Addendum)
Cough, low-grade temperature: As described above, she is 27, she had 2 Covid shots already, last was 07-2019. She needs to be seen in person, likely have a x-ray, basic labs and being checked for Covid. At this point, my office is unable to provide the service, I advised her to go to one of our urgent cares, near [place] that she will go tomorrow first thing in the morning. In the meantime, ER if increased symptoms, chest pain, severe difficulty breathing.

## 2019-11-17 DIAGNOSIS — H52203 Unspecified astigmatism, bilateral: Secondary | ICD-10-CM | POA: Diagnosis not present

## 2019-11-17 DIAGNOSIS — H40053 Ocular hypertension, bilateral: Secondary | ICD-10-CM | POA: Diagnosis not present

## 2019-11-17 DIAGNOSIS — H40013 Open angle with borderline findings, low risk, bilateral: Secondary | ICD-10-CM | POA: Diagnosis not present

## 2019-11-17 DIAGNOSIS — E119 Type 2 diabetes mellitus without complications: Secondary | ICD-10-CM | POA: Diagnosis not present

## 2019-11-17 LAB — HM DIABETES EYE EXAM

## 2019-11-18 ENCOUNTER — Encounter: Payer: Self-pay | Admitting: Internal Medicine

## 2020-01-23 DIAGNOSIS — G4719 Other hypersomnia: Secondary | ICD-10-CM | POA: Diagnosis not present

## 2020-01-23 DIAGNOSIS — R296 Repeated falls: Secondary | ICD-10-CM | POA: Diagnosis not present

## 2020-01-23 DIAGNOSIS — G603 Idiopathic progressive neuropathy: Secondary | ICD-10-CM | POA: Diagnosis not present

## 2020-01-30 ENCOUNTER — Other Ambulatory Visit: Payer: Self-pay

## 2020-01-30 ENCOUNTER — Encounter: Payer: Self-pay | Admitting: Internal Medicine

## 2020-01-30 ENCOUNTER — Ambulatory Visit (INDEPENDENT_AMBULATORY_CARE_PROVIDER_SITE_OTHER): Payer: Medicare Other | Admitting: Internal Medicine

## 2020-01-30 VITALS — BP 120/72 | HR 86 | Temp 98.0°F | Resp 16 | Ht 64.0 in | Wt 139.1 lb

## 2020-01-30 DIAGNOSIS — R7989 Other specified abnormal findings of blood chemistry: Secondary | ICD-10-CM | POA: Diagnosis not present

## 2020-01-30 DIAGNOSIS — W19XXXA Unspecified fall, initial encounter: Secondary | ICD-10-CM | POA: Diagnosis not present

## 2020-01-30 DIAGNOSIS — S40011A Contusion of right shoulder, initial encounter: Secondary | ICD-10-CM

## 2020-01-30 DIAGNOSIS — M25511 Pain in right shoulder: Secondary | ICD-10-CM

## 2020-01-30 DIAGNOSIS — M810 Age-related osteoporosis without current pathological fracture: Secondary | ICD-10-CM | POA: Diagnosis not present

## 2020-01-30 DIAGNOSIS — K7689 Other specified diseases of liver: Secondary | ICD-10-CM

## 2020-01-30 LAB — HEPATIC FUNCTION PANEL
ALT: 219 U/L — ABNORMAL HIGH (ref 0–35)
AST: 49 U/L — ABNORMAL HIGH (ref 0–37)
Albumin: 4 g/dL (ref 3.5–5.2)
Alkaline Phosphatase: 327 U/L — ABNORMAL HIGH (ref 39–117)
Bilirubin, Direct: 0.2 mg/dL (ref 0.0–0.3)
Total Bilirubin: 0.7 mg/dL (ref 0.2–1.2)
Total Protein: 6.5 g/dL (ref 6.0–8.3)

## 2020-01-30 LAB — PROTIME-INR
INR: 0.9 ratio (ref 0.8–1.0)
Prothrombin Time: 10.4 s (ref 9.6–13.1)

## 2020-01-30 MED ORDER — ALENDRONATE SODIUM 70 MG PO TABS
70.0000 mg | ORAL_TABLET | ORAL | 3 refills | Status: DC
Start: 2020-01-30 — End: 2020-03-31

## 2020-01-30 NOTE — Progress Notes (Signed)
Subjective:    Patient ID: Sierra Klein, female    DOB: 01-31-1932, 86 y.o.   MRN: [id number]  DOS:  01/30/2020 Type of visit - description: Follow-up Since the last office visit had mechanical fall while walking on her yard (6 weeks ago). Had ankle sprain which is better Developed right shoulder pain, not improving. Denies any neck, head or hip injury/pain.  Labs from last GI visit reviewed.  Review of Systems Denies abdominal pain, nausea or vomiting.   Past Medical History:  Diagnosis Date   B12 deficiency anemia    Cholelithiasis    Colon polyps    Diabetes mellitus    Disturbance, sleep    narcolepsy   Diverticulitis    Hiatal hernia    Hyperlipidemia    started med 6/09   Neuropathy 1998   not releated to DM w/u neg (idopatic progressive neuropathy)= f/u [facility]   Osteoarthritis     Past Surgical History:  Procedure Laterality Date   ABDOMINAL HYSTERECTOMY     no oophorectomy   BELPHAROPTOSIS REPAIR     BREAST BIOPSY  09/1983   left   CATARACT EXTRACTION     l- 9/09, r- 3/10   CLOSED REDUCTION METACARPAL WITH PERCUTANEOUS PINNING Right 01/12/2015   Procedure: PINNING RIGHT 4 & 5 METACARPAL FRACTURES;  Surgeon: Milly Jakob, MD;  Location: [facility];  Service: Orthopedics;  Laterality: Right;   FOOT SURGERY  03/1988   cyst removed from right heel   LAPAROSCOPIC LYSIS OF ADHESIONS  2013    RLQ pain resolved after surgery   pelvic surgery  2003   cysto-recto-enetero cele repair     Allergies as of 01/30/2020      Reactions   Latex    Sulfonamide Derivatives Hives      Medication List       Accurate as of January 30, 2020 11:59 PM. If you have any questions, ask your nurse or doctor.        STOP taking these medications   benzonatate 100 MG capsule Commonly known as: TESSALON Stopped by: Kathlene November, MD     TAKE these medications   alendronate 70 MG tablet Commonly known as: FOSAMAX Take 1 tablet (70  mg total) by mouth once a week. Take with a full glass of water on an empty stomach. Started by: Kathlene November, MD   Ambien 10 MG tablet Generic drug: zolpidem Take 5 mg by mouth at bedtime as needed.   calcium carbonate 1250 MG capsule Take 1,250 mg by mouth 2 (two) times daily with a meal.   cetirizine 10 MG tablet Commonly known as: ZYRTEC Take 1 tablet (10 mg total) by mouth daily.   cyanocobalamin 1000 MCG tablet Take 100 mcg by mouth daily.   diclofenac sodium 1 % Gel Commonly known as: VOLTAREN Apply 2 g topically as needed.   diphenoxylate-atropine 2.5-0.025 MG tablet Commonly known as: LOMOTIL TAKE 1 TABLET BY MOUTH THREE TIMES DAILY AS NEEDED FOR DIARRHEA   DULoxetine 60 MG capsule Commonly known as: CYMBALTA Take 60 mg by mouth daily.   gabapentin 600 MG tablet Commonly known as: NEURONTIN Take 600 mg by mouth 3 (three) times daily.   multivitamin per tablet Take 1 tablet by mouth daily.   Norco 5-325 MG tablet Generic drug: HYDROcodone-acetaminophen Take by mouth.   Nuvigil 150 MG tablet Generic drug: Armodafinil Take 150 mg by mouth daily as needed.   timolol 0.5 % ophthalmic solution Commonly known  as: BETIMOL Place 1 drop into both eyes daily.   VITAMIN D-3 PO Take 1 capsule by mouth daily.          Objective:   Physical Exam BP 120/72 (BP Location: Left Arm, Patient Position: Sitting, Cuff Size: Small)    Pulse 86    Temp 98 F (36.7 C) (Oral)    Resp 16    Ht 5\' 4"  (1.626 m)    Wt 139 lb 2 oz (63.1 kg)    SpO2 96%    BMI 23.88 kg/m  General:   Well developed, NAD, BMI noted. HEENT:  Normocephalic . Face symmetric, atraumatic Lungs:  CTA B Normal respiratory effort, no intercostal retractions, no accessory muscle use. Heart: RRR,  no murmur.  Lower extremities: no pretibial edema bilaterally  Skin: Not pale. Not jaundice MSK: Shoulders: Symmetric, no TTP, range of motion decreased more so on the left than on the right. Neurologic:    alert & oriented X3.  Speech normal, gait appropriate for age and unassisted Psych--  Cognition and judgment appear intact.  Cooperative with normal attention span and concentration.  Behavior appropriate. No anxious or depressed appearing.      Assessment    Assessment DM Hyperlipidemia Depression, insomnia b 12 deficiency Neuro: -H/o vertigo -Narcolepsy Dr Bertrum Sol -Neuropathy, 1998, not related to DM, workup negative, idiopathic. Dr Jannifer [facility]; hydrocodone per Dr Jannifer Franklin Chronic L ankle edema DJD   Wrist Fx 12-2014 DEXA 12-07 (-) and 7-10 normal; T score -1.8 (07-2015) GI:   HH; colon polyps;  Diverticulitis;  cholelithiasis per CT 2012  PLAN: Increase LFTs: Seen by GI, next follow-up 04-2020.  Number of tests were done and they were satisfactory.  Plan: Recheck LFTs and INR. Depression, insomnia: Well-controlled on Cymbalta, rarely take ambien. Narcolepsy, neuropathy: Per neurology. Osteoporosis: T score for the last 2021 was -1.7, at the time I rec conservative treatment however on further review of the chart I realized that she had a fracture of the wrist after a fall from standing. Consequently recommend a more aggressive treatment. We agreed on Fosamax once weekly, precautions discussed.  Last vitamin D satisfactory Falls: Prevention discussed, encourage consistent use of her cane.  Offered PT however she has already referred to PT by neurology. Shoulder contusion: Refer to sports medicine. RTC 4 months     This visit occurred during the SARS-CoV-2 public health emergency.  Safety protocols were in place, including screening questions prior to the visit, additional usage of staff PPE, and extensive cleaning of exam room while observing appropriate contact time as indicated for disinfecting solutions.

## 2020-01-30 NOTE — Patient Instructions (Addendum)
Start taking Fosamax once weekly  GO TO THE LAB : Get the blood work     [place], [place] back for for a checkup in 4 months  Alendronate tablets What is this medicine? ALENDRONATE (a LEN droe nate) slows calcium loss from bones. It helps to make normal healthy bone and to slow bone loss in people with Paget's disease and osteoporosis. It may be used in others at risk for bone loss. This medicine may be used for other purposes; ask your health care provider or pharmacist if you have questions. COMMON BRAND NAME(S): Fosamax What should I tell my health care provider before I take this medicine? They need to know if you have any of these conditions:  dental disease  esophagus, stomach, or intestine problems, like acid reflux or GERD  kidney disease  low blood calcium  low vitamin D  problems sitting or standing 30 minutes  trouble swallowing  an unusual or allergic reaction to alendronate, other medicines, foods, dyes, or preservatives  pregnant or trying to get pregnant  breast-feeding How should I use this medicine? You must take this medicine exactly as directed or you will lower the amount of the medicine you absorb into your body or you may cause yourself harm. Take this medicine by mouth first thing in the morning, after you are up for the day. Do not eat or drink anything before you take your medicine. Swallow the tablet with a full glass (6 to 8 fluid ounces) of plain water. Do not take this medicine with any other drink. Do not chew or crush the tablet. After taking this medicine, do not eat breakfast, drink, or take any medicines or vitamins for at least 30 minutes. Sit or stand up for at least 30 minutes after you take this medicine; do not lie down. Do not take your medicine more often than directed. Talk to your pediatrician regarding the use of this medicine in children. Special care may be needed. Overdosage: If you think  you have taken too much of this medicine contact a poison control center or emergency room at once. NOTE: This medicine is only for you. Do not share this medicine with others. What if I miss a dose? If you miss a dose, do not take it later in the day. Continue your normal schedule starting the next morning. Do not take double or extra doses. What may interact with this medicine?  aluminum hydroxide  antacids  aspirin  calcium supplements  drugs for inflammation like ibuprofen, naproxen, and others  iron supplements  magnesium supplements  vitamins with minerals This list may not describe all possible interactions. Give your health care provider a list of all the medicines, herbs, non-prescription drugs, or dietary supplements you use. Also tell them if you smoke, drink alcohol, or use illegal drugs. Some items may interact with your medicine. What should I watch for while using this medicine? Visit your doctor or health care professional for regular checks ups. It may be some time before you see benefit from this medicine. Do not stop taking your medicine except on your doctor's advice. Your doctor or health care professional may order blood tests and other tests to see how you are doing. You should make sure you get enough calcium and vitamin D while you are taking this medicine, unless your doctor tells you not to. Discuss the foods you eat and the vitamins you take with your health care professional. Some  people who take this medicine have severe bone, joint, and/or muscle pain. This medicine may also increase your risk for a broken thigh bone. Tell your doctor right away if you have pain in your upper leg or groin. Tell your doctor if you have any pain that does not go away or that gets worse. This medicine can make you more sensitive to the sun. If you get a rash while taking this medicine, sunlight may cause the rash to get worse. Keep out of the sun. If you cannot avoid being in the  sun, wear protective clothing and use sunscreen. Do not use sun lamps or tanning beds/booths. What side effects may I notice from receiving this medicine? Side effects that you should report to your doctor or health care professional as soon as possible:  allergic reactions like skin rash, itching or hives, swelling of the face, lips, or tongue  black or tarry stools  bone, muscle or joint pain  changes in vision  chest pain  heartburn or stomach pain  jaw pain, especially after dental work  pain or trouble when swallowing  redness, blistering, peeling or loosening of the skin, including inside the mouth Side effects that usually do not require medical attention (report to your doctor or health care professional if they continue or are bothersome):  changes in taste  diarrhea or constipation  eye pain or itching  headache  nausea or vomiting  stomach gas or fullness This list may not describe all possible side effects. Call your doctor for medical advice about side effects. You may report side effects to FDA at 1-800-FDA-1088. Where should I keep my medicine? Keep out of the reach of children. Store at room temperature of 15 and 30 degrees C (59 and 86 degrees F). Throw away any unused medicine after the expiration date. NOTE: This sheet is a summary. It may not cover all possible information. If you have questions about this medicine, talk to your doctor, pharmacist, or health care provider.  2020 Elsevier/Gold Standard (2010-12-09 08:56:09)

## 2020-01-30 NOTE — Progress Notes (Signed)
Pre visit review using our clinic review tool, if applicable. No additional management support is needed unless otherwise documented below in the visit note. 

## 2020-01-31 DIAGNOSIS — M810 Age-related osteoporosis without current pathological fracture: Secondary | ICD-10-CM | POA: Insufficient documentation

## 2020-01-31 NOTE — Assessment & Plan Note (Signed)
Increase LFTs: Seen by GI, next follow-up 04-2020.  Number of tests were done and they were satisfactory.  Plan: Recheck LFTs and INR. Depression, insomnia: Well-controlled on Cymbalta, rarely take ambien. Narcolepsy, neuropathy: Per neurology. Osteoporosis: T score for the last 2021 was -1.7, at the time I rec conservative treatment however on further review of the chart I realized that she had a fracture of the wrist after a fall from standing. Consequently recommend a more aggressive treatment. We agreed on Fosamax once weekly, precautions discussed.  Last vitamin D satisfactory Falls: Prevention discussed, encourage consistent use of her cane.  Offered PT however she has already referred to PT by neurology. Shoulder contusion: Refer to sports medicine. RTC 4 months

## 2020-02-05 ENCOUNTER — Encounter: Payer: Self-pay | Admitting: Family Medicine

## 2020-02-05 ENCOUNTER — Ambulatory Visit: Payer: Self-pay

## 2020-02-05 ENCOUNTER — Other Ambulatory Visit: Payer: Self-pay

## 2020-02-05 ENCOUNTER — Ambulatory Visit (INDEPENDENT_AMBULATORY_CARE_PROVIDER_SITE_OTHER): Payer: Medicare Other

## 2020-02-05 ENCOUNTER — Ambulatory Visit (INDEPENDENT_AMBULATORY_CARE_PROVIDER_SITE_OTHER): Payer: Medicare Other | Admitting: Family Medicine

## 2020-02-05 VITALS — BP 110/64 | HR 89 | Ht 64.0 in | Wt 138.4 lb

## 2020-02-05 DIAGNOSIS — S46011A Strain of muscle(s) and tendon(s) of the rotator cuff of right shoulder, initial encounter: Secondary | ICD-10-CM

## 2020-02-05 DIAGNOSIS — M25511 Pain in right shoulder: Secondary | ICD-10-CM

## 2020-02-05 NOTE — Patient Instructions (Addendum)
Thank you for coming in today. I think you have a partial rotator cuff tear.   We should be able to combine your balance therapy.  Please let me know where you are scheduled to go.   This should be treatable with physical therapy.  Get xray You should hear from PT soon to schedule.  Recheck with me in 4-6 weeks.  Please let me know sooner if you have a problem or are not doing well.

## 2020-02-05 NOTE — Progress Notes (Signed)
Subjective:    CC: R shoulder pain  I, Molly Weber, LAT, ATC, am serving as scribe for Dr. Lynne Leader.  HPI: Pt is an 86 y/o female presenting w/ R shoulder pain x 6 weeks after falling on a tile floor and landing on her R side/shoulder.  She locates her pain to her entire R shoulder.  Pain is present with abduction and posterior shoulder motion.  Patient notes that she has been referred to balance physical therapy by her neurologist.  She starts soon.  She is not sure which location she was referred to.  Neurologist works for [employer].  Radiating pain: yes into her R upper arm R shoulder mechanical symptoms: No Aggravating factors: R shoulder AROM above approximately 100 deg; R shoulder horiz aBd Treatments tried: Voltaren  Pertinent review of Systems: No fevers or chills  Relevant historical information: Diabetes   Objective:    Vitals:   02/05/20 1431  BP: 110/64  Pulse: 89  SpO2: 96%   General: Well Developed, well nourished, and in no acute distress.   MSK: Right shoulder normal-appearing Nontender. Range of motion abduction 120 degrees.  External rotation full internal rotation lumbar spine. Strength intact abduction external and internal rotation. Positive Hawkins and Neer's test.  Positive empty can test. Negative Neer's and speeds test.  Lab and Radiology Results  X-ray right shoulder obtained today personally and independently reviewed No acute fractures minimal degenerative changes. Await formal radiology review  Diagnostic Limited MSK Ultrasound of: Right shoulder Biceps tendon intact.  Tendon enlarged with hypoechoic fluid within tendon sheath proximal biceps tendon. Subscapularis tendon is thinned with decreased bulk with possible partial tear.  No full retraction. Supraspinatus tendon also shows hypoechoic change within the mid substance of tendon without retraction.  Probable partial-thickness bursal surface tear distally.  No significant  retraction. Infraspinatus tendon is normal appearing AC joint narrowed degenerative. Impression: Probable incomplete rotator cuff tear of supraspinatus tendon and possibly of subscapularis tendon.    Impression and Recommendations:    Assessment and Plan: 86 y.o. female with right shoulder pain after fall.  Likely partial rotator cuff tears.  Plan for physical therapy and recheck in 4 to 6 weeks.  Patient will get home and let me know which physical therapy office she was referred to by her neurologist.  We may be able to consolidate her physical therapy.   Orders Placed This Encounter  Procedures  . [place] LIMITED JOINT SPACE STRUCTURES UP RIGHT(NO LINKED CHARGES)    Order Specific Question:   Reason for Exam (SYMPTOM  OR DIAGNOSIS REQUIRED)    Answer:   R shoulder pain    Order Specific Question:   Preferred imaging location?    Answer:   [facility]  . DG Shoulder Right    Standing Status:   Future    Number of Occurrences:   1    Standing Expiration Date:   02/04/2021    Order Specific Question:   Reason for Exam (SYMPTOM  OR DIAGNOSIS REQUIRED)    Answer:   eval shoulder pain    Order Specific Question:   Preferred imaging location?    Answer:   [facility]    Order Specific Question:   Radiology Contrast Protocol - do NOT remove file path    Answer:   [email or web address]  . Ambulatory referral to Physical Therapy    Referral Priority:   Routine    Referral Type:   Physical Medicine    Referral  Reason:   Specialty Services Required    Requested Specialty:   Physical Therapy   No orders of the defined types were placed in this encounter.   Discussed warning signs or symptoms. Please see discharge instructions. Patient expresses understanding.   The above documentation has been reviewed and is accurate and complete Lynne Leader, M.D.

## 2020-02-06 NOTE — Progress Notes (Signed)
X-ray shoulder shows some mild arthritis

## 2020-02-17 ENCOUNTER — Other Ambulatory Visit (INDEPENDENT_AMBULATORY_CARE_PROVIDER_SITE_OTHER): Payer: Medicare Other

## 2020-02-17 DIAGNOSIS — R7989 Other specified abnormal findings of blood chemistry: Secondary | ICD-10-CM

## 2020-02-17 LAB — PROTIME-INR
INR: 1 ratio (ref 0.8–1.0)
Prothrombin Time: 11 s (ref 9.6–13.1)

## 2020-02-17 LAB — HEPATIC FUNCTION PANEL
ALT: 30 U/L (ref 0–35)
AST: 20 U/L (ref 0–37)
Albumin: 3.9 g/dL (ref 3.5–5.2)
Alkaline Phosphatase: 252 U/L — ABNORMAL HIGH (ref 39–117)
Bilirubin, Direct: 0.1 mg/dL (ref 0.0–0.3)
Total Bilirubin: 0.7 mg/dL (ref 0.2–1.2)
Total Protein: 6.5 g/dL (ref 6.0–8.3)

## 2020-02-18 ENCOUNTER — Other Ambulatory Visit: Payer: Self-pay

## 2020-02-18 DIAGNOSIS — R7989 Other specified abnormal findings of blood chemistry: Secondary | ICD-10-CM

## 2020-02-20 ENCOUNTER — Other Ambulatory Visit: Payer: Self-pay

## 2020-02-20 ENCOUNTER — Ambulatory Visit (INDEPENDENT_AMBULATORY_CARE_PROVIDER_SITE_OTHER): Payer: Medicare Other | Admitting: Rehabilitative and Restorative Service Providers"

## 2020-02-20 ENCOUNTER — Encounter: Payer: Self-pay | Admitting: Rehabilitative and Restorative Service Providers"

## 2020-02-20 DIAGNOSIS — Z9181 History of falling: Secondary | ICD-10-CM

## 2020-02-20 DIAGNOSIS — M25511 Pain in right shoulder: Secondary | ICD-10-CM

## 2020-02-20 DIAGNOSIS — M6281 Muscle weakness (generalized): Secondary | ICD-10-CM | POA: Diagnosis not present

## 2020-02-20 NOTE — Patient Instructions (Signed)
Access Code: [email or web address] URL: [email or web address] Date: 02/20/2020 Prepared by: Scot Jun  Exercises Supine Shoulder Flexion Extension AAROM with Dowel - 2 x daily - 7 x weekly - 10 reps - 3 sets - 5 hold Isometric Shoulder Flexion at Wall - 2 x daily - 7 x weekly - 1 sets - 10 reps - 5 hold Standing Isometric Shoulder External Rotation with Doorway - 2 x daily - 7 x weekly - 1 sets - 10 reps - 5 hold Standing Isometric Shoulder Abduction with Doorway - Arm Bent - 2 x daily - 7 x weekly - 1 sets - 10 reps - 5 hold

## 2020-02-20 NOTE — Therapy (Addendum)
[facility] Physical Therapy [address] [place], [place], [address] Phone: [phone]   Fax:  [phone]  Physical Therapy Evaluation  Patient Details  Name: Sierra Klein MRN: [id number] Date of Birth: 1931-09-25 Referring Provider (PT): Dr. Lynne Leader   Encounter Date: 02/20/2020   PT End of Session - 02/20/20 1351    Visit Number 1    Number of Visits 12    Date for PT Re-Evaluation 04/16/20    Authorization Type Medicare    Authorization - Visit Number 1    Progress Note Due on Visit 10    PT Start Time 1310    PT Stop Time 1345    PT Time Calculation (min) 35 min    Activity Tolerance Patient tolerated treatment well    Behavior During Therapy [facility] for tasks assessed/performed           Past Medical History:  Diagnosis Date  . B12 deficiency anemia   . Cholelithiasis   . Colon polyps   . Diabetes mellitus   . Disturbance, sleep    narcolepsy  . Diverticulitis   . Hiatal hernia   . Hyperlipidemia    started med 6/09  . 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 Osteoarthritis     Past Surgical History:  Procedure Laterality Date  . ABDOMINAL HYSTERECTOMY     no oophorectomy  . BELPHAROPTOSIS REPAIR    . BREAST BIOPSY  09/1983   left  . CATARACT EXTRACTION     l- 9/09, r- 3/10  . CLOSED REDUCTION METACARPAL WITH PERCUTANEOUS PINNING Right 01/12/2015   Procedure: PINNING RIGHT 4 & 5 METACARPAL FRACTURES;  Surgeon: Milly Jakob, MD;  Location: [place];  Service: Orthopedics;  Laterality: Right;  . FOOT SURGERY  03/1988   cyst removed from right heel  . LAPAROSCOPIC LYSIS OF ADHESIONS  2013    RLQ pain resolved after surgery  . pelvic surgery  2003   cysto-recto-enetero cele repair     There were no vitals filed for this visit.    Subjective Assessment - 02/20/20 1315    Subjective Pt. indicated she fell into a door and hit shoulder on floor.   Pt. stated feeling a bruise in shoulder and it kept hurting.  Pt. indicated MD thought she might have partial tear in shoulder.  Pt. stated fall occurred about 3 weeks ago.  Pt stated having therapy for balance at times off and on in past.  Pt. stated she had an appointment for balance at another office but canceled to come here with shoulder.    Limitations House hold activities    Diagnostic tests xrays performed    Patient Stated Goals Reduce pain, get arm use back    Currently in Pain? Yes    Pain Score 0-No pain   at worst 10/10   Pain Location Shoulder    Pain Orientation Right    Pain Descriptors / Indicators Aching;Sharp    Pain Type Acute pain    Pain Onset 1 to 4 weeks ago    Pain Frequency Intermittent    Aggravating Factors  various arm movements (catch of pain), lifting, carrying, sleeping    Pain Relieving Factors rest, avoid quick movements    Effect of Pain on Daily Activities Daily housework, laundry              [facility] PT Assessment - 02/20/20 0001  Assessment   Medical Diagnosis Rt shoulder pain, balance deficits    Referring Provider (PT) Dr. Lynne Leader    Onset Date/Surgical Date 01/25/20    Hand Dominance Right      Precautions   Precautions None      Balance Screen   Has the patient fallen in the past 6 months Yes    How many times? 1    Is the patient reluctant to leave their home because of a fear of falling?  No      Home Social worker Private residence    Home Layout Two level;Able to live on main level with bedroom/bathroom    Additional Comments Lives alone      Prior Function   Level of Independence Independent      Cognition   Overall Cognitive Status Within Functional Limits for tasks assessed      Functional Tests   Functional tests Single leg stance      Single Leg Stance   Comments Unable to maintain SLS either LE      Posture/Postural Control   Posture Comments Shoulder protraction, mild anterior tilt       ROM / Strength   AROM / PROM / Strength Strength;PROM;AROM      AROM   Overall AROM Comments Rt shoulder pain at end range    AROM Assessment Site Shoulder    Right/Left Shoulder Left;Right    Left Shoulder Flexion 120 Degrees    Left Shoulder ABduction 85 Degrees    Left Shoulder Internal Rotation 80 Degrees   measured in supine 45 deg abd   Left Shoulder External Rotation 90 Degrees   measured in supine 45 deg abd     PROM   PROM Assessment Site Shoulder    Right/Left Shoulder Left;Right    Right Shoulder ABduction 110 Degrees    Left Shoulder Flexion 135 Degrees    Left Shoulder Internal Rotation 80 Degrees   measured in supine 45 deg abd   Left Shoulder External Rotation 95 Degrees   measured in supine 45 deg abd     Strength   Overall Strength Comments Pain in Rt shoulder noted c flexion, abd, er MMT    Strength Assessment Site Shoulder;Elbow;Hip;Knee;Ankle    Right/Left Shoulder Left;Right    Right Shoulder Flexion 2+/5    Right Shoulder ABduction 2+/5    Right Shoulder Internal Rotation 5/5    Right Shoulder External Rotation 4/5    Left Shoulder Flexion 4+/5    Left Shoulder ABduction 4+/5    Left Shoulder Internal Rotation 5/5    Left Shoulder External Rotation 5/5    Right/Left Elbow Left;Right    Right Elbow Flexion 5/5    Right Elbow Extension 5/5    Left Elbow Flexion 5/5    Left Elbow Extension 5/5    Right/Left Hip Left;Right    Right/Left Knee Left;Right    Right/Left Ankle Left;Right      Special Tests   Other special tests Rt (+) Empty can, LAG, painful arc      Standardized Balance Assessment   Standardized Balance Assessment Timed Up and Go Test      Timed Up and Go Test   Normal TUG (seconds) 8.5                      Objective measurements completed on examination: See above findings.       [facility] Adult PT Treatment/Exercise - 02/20/20 0001  Exercises   Exercises Other Exercises    Other Exercises  Isometric flexion,  abd, ER Rt UE in doorway 5 sec hold x 10 each, supine wand flexion x 15.  Additional time spent c cues for intervention techniques                  PT Education - 02/20/20 1351    Education Details HEP, POC    Person(s) Educated Patient    Methods Explanation;Demonstration;Handout;Verbal cues    Comprehension Verbalized understanding;Returned demonstration            PT Short Term Goals - 02/20/20 1351      PT SHORT TERM GOAL #1   Title Patient will demonstrate independent use of home exercise program to maintain progress from in clinic treatments.    Time 2    Period Weeks    Status New    Target Date 03/05/20             PT Long Term Goals - 02/20/20 1350      PT LONG TERM GOAL #1   Title Patient will demonstrate/report pain at worst less than or equal to 2/10 to facilitate minimal limitation in daily activity secondary to pain symptoms.    Time 8    Period Weeks    Status New    Target Date 04/16/20      PT LONG TERM GOAL #2   Title Patient will demonstrate independent use of home exercise program to facilitate ability to maintain/progress functional gains from skilled physical therapy services.    Time 8    Period Weeks    Status New    Target Date 04/16/20      PT LONG TERM GOAL #3   Title Patient will demonstrate Rt GH joint mobility WFL to facilitate usual self care, dressing, reaching overhead at PLOF s limitation due to symptoms.    Time 8    Period Weeks    Status New    Target Date 04/16/20      PT LONG TERM GOAL #4   Title Pt. will demonstrate Rt UE MMT equal to Lt to facilitate usual daily activity in home at [facility].    Time 8    Period Weeks    Status New    Target Date 04/16/20                  Plan - 02/20/20 1341    Clinical Impression Statement Patient is a 86 y.o. female who comes to clinic with complaints of Rt shoulder pain with mobility, strength deficits, history of fall that impair her ability to perform usual daily  and recreational functional activities without increase difficulty/symptoms at this time.  Patient to benefit from skilled PT services to address impairments and limitations to improve to previous level of function without restriction secondary to condition.    Examination-Activity Limitations Carry;Hygiene/Grooming;Lift;Reach Overhead    Examination-Participation Restrictions Laundry;Other   household stuff   Stability/Clinical Decision Making Stable/Uncomplicated    Clinical Decision Making Low    Rehab Potential Fair    PT Frequency 2x / week    PT Duration 8 weeks    PT Treatment/Interventions ADLs/Self Care Home Management;Cryotherapy;Electrical Stimulation;Iontophoresis 4mg /ml Dexamethasone;Moist Heat;Balance training;Therapeutic exercise;Therapeutic activities;Functional mobility training;Stair training;Gait training;Ultrasound;Neuromuscular re-education;Patient/family education;Manual techniques;Dry needling;Passive range of motion;Spinal Manipulations;Joint Manipulations    PT Next Visit Plan Promote improved active mobility, strength for Rt UE improvements, balance improvements    PT Home Exercise Plan [email or web address]  Consulted and Agree with Plan of Care Patient           Patient will benefit from skilled therapeutic intervention in order to improve the following deficits and impairments:  Decreased endurance, Decreased activity tolerance, Decreased strength, Impaired UE functional use, Pain, Decreased balance, Decreased range of motion, Impaired perceived functional ability  Visit Diagnosis: Acute pain of right shoulder - Plan: PT plan of care cert/re-cert  Muscle weakness (generalized) - Plan: PT plan of care cert/re-cert  History of falling - Plan: PT plan of care cert/re-cert     Problem List Patient Active Problem List   Diagnosis Date Noted  . Osteoporosis 01/31/2020  . PCP NOTES >>>>> 05/26/2015  . Anxiety 09/08/2014  . Other malaise and fatigue 11/11/2013  .  Abnormal gait 09/02/2013  . Idiopathic progressive polyneuropathy 09/02/2013  . Annual physical exam 11/14/2011  . Calculus of gallbladder without mention of cholecystitis or obstruction 05/17/2011  . Diverticulosis of colon (without mention of hemorrhage) 05/17/2011  . Diarrhea 03/08/2010  . Hyperlipidemia 04/22/2008  . ANEMIA, B12 DEFICIENCY 08/20/2007  . Neuropathy 08/20/2007  . OSTEOARTHRITIS 08/20/2007  . DM II (diabetes mellitus, type II), controlled ([place]) 02/06/2007  . Insomnia 02/06/2007   Scot Jun, PT, DPT, OCS, ATC 02/20/20  1:56 PM     02/20/20 0001  Assessment  Medical Diagnosis Rt shoulder pain, balance deficits  Referring Provider (PT) Dr. Lynne Leader  Onset Date/Surgical Date 01/25/20  Hand Dominance Right  Precautions  Precautions None  Balance Screen  Has the patient fallen in the past 6 months Yes  How many times? 1  Is the patient reluctant to leave their home because of a fear of falling?  No  Home Teaching laboratory technician Private residence  Home Layout Two level;Able to live on main level with bedroom/bathroom  Additional Comments Lives alone  Prior Function  Level of Independence Independent  Cognition  Overall Cognitive Status Within Functional Limits for tasks assessed  Functional Tests  Functional tests Single leg stance  Single Leg Stance  Comments Unable to maintain SLS either LE  Posture/Postural Control  Posture Comments Shoulder protraction, mild anterior tilt  ROM / Strength  AROM / PROM / Strength Strength;PROM;AROM  AROM  Overall AROM Comments Rt shoulder pain at end range  AROM Assessment Site Shoulder  Right/Left Shoulder Left;Right  Right Shoulder Flexion 120 Degrees  Right Shoulder ABduction 85 Degrees  Right Shoulder Internal Rotation 80 Degrees  Right Shoulder External Rotation 90 Degrees  Left Shoulder Internal Rotation  (measured in supine 45 deg abd)  Left Shoulder External Rotation  (measured in supine 45  deg abd)  PROM  PROM Assessment Site Shoulder  Right/Left Shoulder Left;Right  Right Shoulder ABduction 110 Degrees  Left Shoulder Flexion 135 Degrees  Left Shoulder Internal Rotation 80 Degrees (measured in supine 45 deg abd)  Left Shoulder External Rotation 95 Degrees (measured in supine 45 deg abd)  Strength  Overall Strength Comments Pain in Rt shoulder noted c flexion, abd, er MMT  Strength Assessment Site Shoulder;Elbow;Hip;Knee;Ankle  Right/Left Shoulder Left;Right  Right/Left Elbow Left;Right  Right/Left Hip Left;Right  Right/Left Knee Left;Right  Right/Left Ankle Left;Right  Right Elbow Flexion 5/5  Right Elbow Extension 5/5  Left Elbow Flexion 5/5  Left Elbow Extension 5/5  Right Shoulder Flexion 2+/5  Right Shoulder ABduction 2+/5  Right Shoulder Internal Rotation 5/5  Right Shoulder External Rotation 4/5  Left Shoulder Flexion 4+/5  Left Shoulder ABduction 4+/5  Left Shoulder Internal Rotation 5/5  Left Shoulder External Rotation 5/5  Special Tests  Other special tests Rt (+) Empty can, LAG, painful arc  Standardized Balance Assessment  Standardized Balance Assessment TUG  Timed Up and Go Test  Normal TUG (seconds) 8.5   Addended visit information to correct AROM data to Rt shoulder  Scot Jun, PT, DPT, OCS, ATC 03/12/20  1:06 PM      [facility] Physical Therapy [address] [place], [place], [address] Phone: [phone]   Fax:  [phone]  Name: Sierra Klein MRN: [id number] Date of Birth: 08-06-1931

## 2020-03-09 ENCOUNTER — Ambulatory Visit (INDEPENDENT_AMBULATORY_CARE_PROVIDER_SITE_OTHER): Payer: Medicare Other | Admitting: Rehabilitative and Restorative Service Providers"

## 2020-03-09 ENCOUNTER — Encounter: Payer: Self-pay | Admitting: Rehabilitative and Restorative Service Providers"

## 2020-03-09 ENCOUNTER — Other Ambulatory Visit: Payer: Self-pay

## 2020-03-09 DIAGNOSIS — Z9181 History of falling: Secondary | ICD-10-CM | POA: Diagnosis not present

## 2020-03-09 DIAGNOSIS — M25511 Pain in right shoulder: Secondary | ICD-10-CM

## 2020-03-09 DIAGNOSIS — M6281 Muscle weakness (generalized): Secondary | ICD-10-CM | POA: Diagnosis not present

## 2020-03-09 NOTE — Therapy (Signed)
[facility] Physical Therapy [address] [place], [place], [address] Phone: [phone]   Fax:  [phone]  Physical Therapy Treatment  Patient Details  Name: Sierra Klein MRN: [id number] Date of Birth: 1932-01-01 Referring Provider (PT): Dr. Lynne Leader   Encounter Date: 03/09/2020   PT End of Session - 03/09/20 1303    Visit Number 2    Number of Visits 12    Date for PT Re-Evaluation 04/16/20    Authorization Type Medicare    Authorization - Visit Number 2    Progress Note Due on Visit 10    PT Start Time 1300    PT Stop Time 1340    PT Time Calculation (min) 40 min    Activity Tolerance Patient tolerated treatment well    Behavior During Therapy [facility] for tasks assessed/performed           Past Medical History:  Diagnosis Date   B12 deficiency anemia    Cholelithiasis    Colon polyps    Diabetes mellitus    Disturbance, sleep    narcolepsy   Diverticulitis    Hiatal hernia    Hyperlipidemia    started med 6/09   Neuropathy 1998   not releated to DM w/u neg (idopatic progressive neuropathy)= f/u [facility]   Osteoarthritis     Past Surgical History:  Procedure Laterality Date   ABDOMINAL HYSTERECTOMY     no oophorectomy   BELPHAROPTOSIS REPAIR     BREAST BIOPSY  09/1983   left   CATARACT EXTRACTION     l- 9/09, r- 3/10   CLOSED REDUCTION METACARPAL WITH PERCUTANEOUS PINNING Right 01/12/2015   Procedure: PINNING RIGHT 4 & 5 METACARPAL FRACTURES;  Surgeon: Milly Jakob, MD;  Location: [place];  Service: Orthopedics;  Laterality: Right;   FOOT SURGERY  03/1988   cyst removed from right heel   LAPAROSCOPIC LYSIS OF ADHESIONS  2013    RLQ pain resolved after surgery   pelvic surgery  2003   cysto-recto-enetero cele repair     There were no vitals filed for this visit.   Subjective Assessment - 03/09/20 1301    Subjective Pt. indicated feeling like she is doing some better.  Pt. stated  using arm a bit more.  Still having pain but doing some better.    Limitations House hold activities    Diagnostic tests xrays performed    Patient Stated Goals Reduce pain, get arm use back    Currently in Pain? No/denies    Pain Score 0-No pain   pain at worst 6/10   Pain Location Shoulder    Pain Orientation Right    Pain Descriptors / Indicators Aching;Sharp    Pain Type Acute pain    Pain Onset 1 to 4 weeks ago    Pain Frequency Intermittent    Aggravating Factors  quick movements                             [facility] Adult PT Treatment/Exercise - 03/09/20 0001      Neuro Re-ed    Neuro Re-ed Details  rhythmic stabilizations in 100 deg flexion c very mild resistance 15 sec bouts       Exercises   Exercises Shoulder    Other Exercises  Review of HEP      Shoulder Exercises: Supine   Horizontal ABduction AROM;Both;Strengthening;10 reps    Flexion AROM;Both;10 reps  Other Supine Exercises supine 2 lb bar wand flexion x 10      Shoulder Exercises: Standing   Extension Both;20 reps    Theraband Level (Shoulder Extension) Level 3 (Green)    Row Both;Strengthening;20 reps    Theraband Level (Shoulder Row) Level 3 (Green)      Shoulder Exercises: ROM/Strengthening   UBE (Upper Arm Bike) 3 mins fwd/back each way lvl 2.0      Shoulder Exercises: Isometric Strengthening   Other Isometric Exercises 5 seconds x 5 flex, abd, ir, er                    PT Short Term Goals - 03/09/20 1306      PT SHORT TERM GOAL #1   Title Patient will demonstrate independent use of home exercise program to maintain progress from in clinic treatments.    Baseline 03/09/2020: Pt's 1st return visit c cues for technique adjustments    Time 2    Period Weeks    Status On-going    Target Date 03/23/20             PT Long Term Goals - 02/20/20 1350      PT LONG TERM GOAL #1   Title Patient will demonstrate/report pain at worst less than or equal to 2/10 to  facilitate minimal limitation in daily activity secondary to pain symptoms.    Time 8    Period Weeks    Status New    Target Date 04/16/20      PT LONG TERM GOAL #2   Title Patient will demonstrate independent use of home exercise program to facilitate ability to maintain/progress functional gains from skilled physical therapy services.    Time 8    Period Weeks    Status New    Target Date 04/16/20      PT LONG TERM GOAL #3   Title Patient will demonstrate Rt GH joint mobility WFL to facilitate usual self care, dressing, reaching overhead at PLOF s limitation due to symptoms.    Time 8    Period Weeks    Status New    Target Date 04/16/20      PT LONG TERM GOAL #4   Title Pt. will demonstrate Rt UE MMT equal to Lt to facilitate usual daily activity in home at [facility].    Time 8    Period Weeks    Status New    Target Date 04/16/20                 Plan - 03/09/20 1324    Clinical Impression Statement Moderate cues required for HEP technique improvements but overall recall acceptable.  Pt. demonstrated mild improvement in tolerance to active movement in gravity reduced positioning but continued strength and AROM deficits c concordant symptoms noted.  Continued skilled PT services indicated.    Examination-Activity Limitations Carry;Hygiene/Grooming;Lift;Reach Overhead    Examination-Participation Restrictions Laundry;Other   household stuff   Stability/Clinical Decision Making Stable/Uncomplicated    Rehab Potential Fair    PT Frequency 2x / week    PT Duration 8 weeks    PT Treatment/Interventions ADLs/Self Care Home Management;Cryotherapy;Electrical Stimulation;Iontophoresis 4mg /ml Dexamethasone;Moist Heat;Balance training;Therapeutic exercise;Therapeutic activities;Functional mobility training;Stair training;Gait training;Ultrasound;Neuromuscular re-education;Patient/family education;Manual techniques;Dry needling;Passive range of motion;Spinal Manipulations;Joint  Manipulations    PT Next Visit Plan Promote improved active mobility, strength for Rt UE improvements, balance improvements    PT Home Exercise Plan [email or web address]    Consulted and Agree with Plan of  Care Patient           Patient will benefit from skilled therapeutic intervention in order to improve the following deficits and impairments:  Decreased endurance, Decreased activity tolerance, Decreased strength, Impaired UE functional use, Pain, Decreased balance, Decreased range of motion, Impaired perceived functional ability  Visit Diagnosis: Acute pain of right shoulder  Muscle weakness (generalized)  History of falling     Problem List Patient Active Problem List   Diagnosis Date Noted   Osteoporosis 01/31/2020   PCP NOTES >>>>> 05/26/2015   Anxiety 09/08/2014   Other malaise and fatigue 11/11/2013   Abnormal gait 09/02/2013   Idiopathic progressive polyneuropathy 09/02/2013   Annual physical exam 11/14/2011   Calculus of gallbladder without mention of cholecystitis or obstruction 05/17/2011   Diverticulosis of colon (without mention of hemorrhage) 05/17/2011   Diarrhea 03/08/2010   Hyperlipidemia 04/22/2008   ANEMIA, B12 DEFICIENCY 08/20/2007   Neuropathy 08/20/2007   OSTEOARTHRITIS 08/20/2007   DM II (diabetes mellitus, type II), controlled ([place]) 02/06/2007   Insomnia 02/06/2007    Scot Jun, PT, DPT, OCS, ATC 03/09/20  1:36 PM    [facility] [address] [place], [place], [address] Phone: [phone]   Fax:  [phone]  Name: MAYGEN SIRICO MRN: [id number] Date of Birth: 1931/12/11

## 2020-03-11 ENCOUNTER — Ambulatory Visit: Payer: Self-pay

## 2020-03-11 ENCOUNTER — Ambulatory Visit (INDEPENDENT_AMBULATORY_CARE_PROVIDER_SITE_OTHER): Payer: Medicare Other | Admitting: Family Medicine

## 2020-03-11 ENCOUNTER — Ambulatory Visit: Payer: Medicare Other | Admitting: Family Medicine

## 2020-03-11 ENCOUNTER — Encounter: Payer: Self-pay | Admitting: Family Medicine

## 2020-03-11 ENCOUNTER — Other Ambulatory Visit: Payer: Self-pay

## 2020-03-11 VITALS — BP 114/64 | HR 62 | Ht 64.0 in | Wt 136.0 lb

## 2020-03-11 DIAGNOSIS — S46011A Strain of muscle(s) and tendon(s) of the rotator cuff of right shoulder, initial encounter: Secondary | ICD-10-CM

## 2020-03-11 NOTE — Patient Instructions (Signed)
Thank you for coming in today. Plan to continue home exercises and PT.  Recheck with me in 6 weeks. Let me know if you are not doing well.

## 2020-03-11 NOTE — Progress Notes (Signed)
   Sierra Klein, am serving as a scribe for Dr. Lynne Leader. This visit occurred during the SARS-CoV-2 public health emergency.  Safety protocols were in place, including screening questions prior to the visit, additional usage of staff PPE, and extensive cleaning of exam room while observing appropriate contact time as indicated for disinfecting solutions.   Sierra Klein is a 86 y.o. female who presents to [place] [facility] today for f/u of R shoulder pain after falling and landing on her shoulder on a tile floor.  She was last seen by Dr. Georgina Snell on 02/05/20 and was referred to PT of which she has completed 2 visits.  Since her last visit, pt reports that she has gone to 2 sessions of PT. Overall feels like she is moving in right direction. Pain with overhead motions.   Diagnostic imaging: R shoulder XR- 02/05/20  Pertinent review of systems: Klein fevers or chills  Relevant historical information: Diabetes   Exam:  BP 114/64   Pulse 62   Ht 5\' 4"  (1.626 m)   Wt 136 lb (61.7 kg)   SpO2 97%   BMI 23.34 kg/m  General: Well Developed, well nourished, and in Klein acute distress.   MSK: Right shoulder normal-appearing Nontender. Range of motion abduction 130 degrees.  Normal external/internal rotation. Strength intact within limits of motion. Negative Hawkins and Neer's test.     Assessment and Plan: 86 y.o. female with right shoulder pain thought to be due to impingement and bursitis and possible partial-thickness rotator cuff tear.  Improving with limited physical therapy.  Plan to continue PT and home exercise program..  Recheck in 6 weeks.  Return sooner if needed.   PDMP not reviewed this encounter. Orders Placed This Encounter  Procedures  . [place] LIMITED JOINT SPACE STRUCTURES UP RIGHT(Klein LINKED CHARGES)    Order Specific Question:   Reason for Exam (SYMPTOM  OR DIAGNOSIS REQUIRED)    Answer:   R shoulder pain    Order Specific Question:   Preferred  imaging location?    Answer:   [place]   Klein orders of the defined types were placed in this encounter.    Discussed warning signs or symptoms. Please see discharge instructions. Patient expresses understanding.   The above documentation has been reviewed and is accurate and complete Lynne Leader, M.D.   Total encounter time 20 minutes including face-to-face time with the patient and charting on the date of service.

## 2020-03-12 ENCOUNTER — Encounter: Payer: Self-pay | Admitting: Rehabilitative and Restorative Service Providers"

## 2020-03-12 ENCOUNTER — Ambulatory Visit (INDEPENDENT_AMBULATORY_CARE_PROVIDER_SITE_OTHER): Payer: Medicare Other | Admitting: Rehabilitative and Restorative Service Providers"

## 2020-03-12 DIAGNOSIS — M25511 Pain in right shoulder: Secondary | ICD-10-CM

## 2020-03-12 DIAGNOSIS — M6281 Muscle weakness (generalized): Secondary | ICD-10-CM | POA: Diagnosis not present

## 2020-03-12 DIAGNOSIS — Z9181 History of falling: Secondary | ICD-10-CM | POA: Diagnosis not present

## 2020-03-12 NOTE — Patient Instructions (Signed)
Access Code: [email or web address] URL: [email or web address] Date: 03/12/2020 Prepared by: Scot Jun  Exercises Supine Shoulder Flexion Extension AAROM with [place]el - 1 x daily - 7 x weekly - 10 reps - 3 sets - 5 hold Isometric Shoulder Flexion at Wall - 2 x daily - 7 x weekly - 1 sets - 10 reps - 5 hold Standing Isometric Shoulder External Rotation with Doorway - 2 x daily - 7 x weekly - 1 sets - 10 reps - 5 hold Standing Isometric Shoulder Abduction with Doorway - Arm Bent - 2 x daily - 7 x weekly - 1 sets - 10 reps - 5 hold Sidelying Shoulder Abduction Palm Forward - 1 x daily - 7 x weekly - 3 sets - 10 reps Sidelying Shoulder External Rotation - 1 x daily - 7 x weekly - 3 sets - 10 reps Supine Shoulder Flexion Extension Full Range AROM - 1 x daily - 7 x weekly - 3 sets - 10 reps

## 2020-03-12 NOTE — Therapy (Signed)
[facility] [address] [place], [place], [address] Phone: [phone]   Fax:  [phone]  Physical Therapy Treatment  Patient Details  Name: Sierra Klein MRN: [id number] Date of Birth: 1932/02/18 Referring Provider (PT): Dr. Lynne Leader   Encounter Date: 03/12/2020   PT End of Session - 03/12/20 1255    Visit Number 3    Number of Visits 12    Date for PT Re-Evaluation 04/16/20    Authorization Type Medicare    Authorization - Visit Number 3    Progress Note Due on Visit 10    PT Start Time 1255    PT Stop Time 1335    PT Time Calculation (min) 40 min    Activity Tolerance Patient tolerated treatment well    Behavior During Therapy [facility] for tasks assessed/performed           Past Medical History:  Diagnosis Date  . B12 deficiency anemia   . Cholelithiasis   . Colon polyps   . Diabetes mellitus   . Disturbance, sleep    narcolepsy  . Diverticulitis   . Hiatal hernia   . Hyperlipidemia    started med 6/09  . 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 Osteoarthritis     Past Surgical History:  Procedure Laterality Date  . ABDOMINAL HYSTERECTOMY     no oophorectomy  . BELPHAROPTOSIS REPAIR    . BREAST BIOPSY  09/1983   left  . CATARACT EXTRACTION     l- 9/09, r- 3/10  . CLOSED REDUCTION METACARPAL WITH PERCUTANEOUS PINNING Right 01/12/2015   Procedure: PINNING RIGHT 4 & 5 METACARPAL FRACTURES;  Surgeon: Milly Jakob, MD;  Location: [facility];  Service: Orthopedics;  Laterality: Right;  . FOOT SURGERY  03/1988   cyst removed from right heel  . LAPAROSCOPIC LYSIS OF ADHESIONS  2013    RLQ pain resolved after surgery  . pelvic surgery  2003   cysto-recto-enetero cele repair     There were no vitals filed for this visit.   Subjective Assessment - 03/12/20 1259    Subjective Pt. indicated feeling "puny" today as a whole.  Pt. stated shoulder  still hurting some overall at times.    Limitations House hold activities    Diagnostic tests xrays performed    Patient Stated Goals Reduce pain, get arm use back    Currently in Pain? No/denies    Pain Score 0-No pain    Pain Location Shoulder    Pain Orientation Right    Pain Descriptors / Indicators Aching    Pain Onset 1 to 4 weeks ago    Pain Frequency Occasional    Aggravating Factors  lifting, quick movements    Pain Relieving Factors some movement helps stiffness              [facility] PT Assessment - 03/12/20 0001      PROM   Right Shoulder Flexion 128 Degrees                         [facility] Adult PT Treatment/Exercise - 03/12/20 0001      Neuro Re-ed    Neuro Re-ed Details  rhythmic stabilizations in 100 deg flexion c very mild resistance 15 sec bouts       Shoulder Exercises: Supine   Flexion AROM;Right   AROM 2.5 mins, 1 lb x 5  Shoulder Exercises: Sidelying   External Rotation AROM;Strengthening;Right;20 reps   1 lb   External Rotation Weight (lbs) 1    ABduction AROM;Right;20 reps      Shoulder Exercises: Pulleys   Flexion 2 minutes      Shoulder Exercises: ROM/Strengthening   UBE (Upper Arm Bike) 3 mins fwd/back each way lvl 2.0                  PT Education - 03/12/20 1317    Education Details HEP update    Person(s) Educated Patient    Methods Explanation;Demonstration;Verbal cues;Handout    Comprehension Verbalized understanding;Returned demonstration            PT Short Term Goals - 03/09/20 1306      PT SHORT TERM GOAL #1   Title Patient will demonstrate independent use of home exercise program to maintain progress from in clinic treatments.    Baseline 03/09/2020: Pt's 1st return visit c cues for technique adjustments    Time 2    Period Weeks    Status On-going    Target Date 03/23/20             PT Long Term Goals - 02/20/20 1350      PT LONG TERM GOAL #1   Title Patient will demonstrate/report pain  at worst less than or equal to 2/10 to facilitate minimal limitation in daily activity secondary to pain symptoms.    Time 8    Period Weeks    Status New    Target Date 04/16/20      PT LONG TERM GOAL #2   Title Patient will demonstrate independent use of home exercise program to facilitate ability to maintain/progress functional gains from skilled physical therapy services.    Time 8    Period Weeks    Status New    Target Date 04/16/20      PT LONG TERM GOAL #3   Title Patient will demonstrate Rt GH joint mobility WFL to facilitate usual self care, dressing, reaching overhead at PLOF s limitation due to symptoms.    Time 8    Period Weeks    Status New    Target Date 04/16/20      PT LONG TERM GOAL #4   Title Pt. will demonstrate Rt UE MMT equal to Lt to facilitate usual daily activity in home at [facility].    Time 8    Period Weeks    Status New    Target Date 04/16/20                 Plan - 03/12/20 1317    Clinical Impression Statement Mild improvement in active mobility noted in Rt GH jt at this time c reduced complaints.  Unable to perform movement to full range against resistance without noted fatigue easily in small amount of reps.  Continued skilled PT services indicated to progress strength.    Examination-Activity Limitations Carry;Hygiene/Grooming;Lift;Reach Overhead    Examination-Participation Restrictions Laundry;Other   household stuff   Stability/Clinical Decision Making Stable/Uncomplicated    Rehab Potential Fair    PT Frequency 2x / week    PT Duration 8 weeks    PT Treatment/Interventions ADLs/Self Care Home Management;Cryotherapy;Electrical Stimulation;Iontophoresis 4mg /ml Dexamethasone;Moist Heat;Balance training;Therapeutic exercise;Therapeutic activities;Functional mobility training;Stair training;Gait training;Ultrasound;Neuromuscular re-education;Patient/family education;Manual techniques;Dry needling;Passive range of motion;Spinal  Manipulations;Joint Manipulations    PT Next Visit Plan Gravity reduced AROM, strengthening to improve elevation atttempts.    [place]  Consulted and Agree with Plan of Care Patient           Patient will benefit from skilled therapeutic intervention in order to improve the following deficits and impairments:  Decreased endurance, Decreased activity tolerance, Decreased strength, Impaired UE functional use, Pain, Decreased balance, Decreased range of motion, Impaired perceived functional ability  Visit Diagnosis: Acute pain of right shoulder  Muscle weakness (generalized)  History of falling     Problem List Patient Active Problem List   Diagnosis Date Noted  . Osteoporosis 01/31/2020  . PCP NOTES >>>>> 05/26/2015  . Anxiety 09/08/2014  . Other malaise and fatigue 11/11/2013  . Abnormal gait 09/02/2013  . Idiopathic progressive polyneuropathy 09/02/2013  . Annual physical exam 11/14/2011  . Calculus of gallbladder without mention of cholecystitis or obstruction 05/17/2011  . Diverticulosis of colon (without mention of hemorrhage) 05/17/2011  . Diarrhea 03/08/2010  . Hyperlipidemia 04/22/2008  . ANEMIA, B12 DEFICIENCY 08/20/2007  . Neuropathy 08/20/2007  . OSTEOARTHRITIS 08/20/2007  . DM II (diabetes mellitus, type II), controlled ([place]) 02/06/2007  . Insomnia 02/06/2007    Scot Jun, PT, DPT, OCS, ATC 03/12/20  1:29 PM    [facility] [address] [place], [place], [address] Phone: [phone]   Fax:  [phone]  Name: Sierra Klein MRN: [id number] Date of Birth: [birth year]

## 2020-03-16 ENCOUNTER — Other Ambulatory Visit: Payer: Self-pay

## 2020-03-16 ENCOUNTER — Encounter: Payer: Self-pay | Admitting: Rehabilitative and Restorative Service Providers"

## 2020-03-16 ENCOUNTER — Ambulatory Visit (INDEPENDENT_AMBULATORY_CARE_PROVIDER_SITE_OTHER): Payer: Medicare Other | Admitting: Rehabilitative and Restorative Service Providers"

## 2020-03-16 DIAGNOSIS — M6281 Muscle weakness (generalized): Secondary | ICD-10-CM

## 2020-03-16 DIAGNOSIS — M25511 Pain in right shoulder: Secondary | ICD-10-CM

## 2020-03-16 DIAGNOSIS — Z9181 History of falling: Secondary | ICD-10-CM

## 2020-03-16 NOTE — Therapy (Signed)
[facility] Therapy [address] [place], [place], [address] Phone: [phone]   Fax:  [phone]  Physical Therapy Treatment  Patient Details  Name: Sierra Klein MRN: [id number] Date of Birth: 07/23/1931 Referring Provider (PT): Dr. Lynne Leader   Encounter Date: 03/16/2020   PT End of Session - 03/16/20 1310    Visit Number 4    Number of Visits 12    Date for PT Re-Evaluation 04/16/20    Authorization Type Medicare    Authorization - Visit Number 4    Progress Note Due on Visit 10    PT Start Time 0093    PT Stop Time 8182    PT Time Calculation (min) 39 min    Activity Tolerance Patient tolerated treatment well;Patient limited by fatigue    Behavior During Therapy [facility] for tasks assessed/performed           Past Medical History:  Diagnosis Date   B12 deficiency anemia    Cholelithiasis    Colon polyps    Diabetes mellitus    Disturbance, sleep    narcolepsy   Diverticulitis    Hiatal hernia    Hyperlipidemia    started med 6/09   Neuropathy 1998   not releated to DM w/u neg (idopatic progressive neuropathy)= f/u [facility]   Osteoarthritis     Past Surgical History:  Procedure Laterality Date   ABDOMINAL HYSTERECTOMY     no oophorectomy   BELPHAROPTOSIS REPAIR     BREAST BIOPSY  09/1983   left   CATARACT EXTRACTION     l- 9/09, r- 3/10   CLOSED REDUCTION METACARPAL WITH PERCUTANEOUS PINNING Right 01/12/2015   Procedure: PINNING RIGHT 4 & 5 METACARPAL FRACTURES;  Surgeon: Milly Jakob, MD;  Location: [facility];  Service: Orthopedics;  Laterality: Right;   FOOT SURGERY  03/1988   cyst removed from right heel   LAPAROSCOPIC LYSIS OF ADHESIONS  2013    RLQ pain resolved after surgery   pelvic surgery  2003   cysto-recto-enetero cele repair     There were no vitals filed for this visit.   Subjective Assessment - 03/16/20 1309    Subjective Pt. indicated she didn't get much  exercise in on the weekend due to family in town.  Reported no pain today upon arrival. Pt. stated improvement in reaching but still some limitation.    Limitations House hold activities    Diagnostic tests xrays performed    Patient Stated Goals Reduce pain, get arm use back    Currently in Pain? No/denies    Pain Score 0-No pain    Pain Onset 1 to 4 weeks ago    Aggravating Factors  reaching behind back              [facility] PT Assessment - 03/16/20 0001      Assessment   Medical Diagnosis Rt shoulder pain, balance deficits    Referring Provider (PT) Dr. Lynne Leader    Onset Date/Surgical Date 01/25/20    Hand Dominance Right      AROM   Right Shoulder Flexion 135 Degrees    Right Shoulder ABduction 128 Degrees                         [facility] Adult PT Treatment/Exercise - 03/16/20 0001      Neuro Re-ed    Neuro Re-ed Details  rhythmic stabilizations in 100 deg flexion c very mild resistance  15 sec bouts       Shoulder Exercises: Supine   Horizontal ABduction Strengthening;Both;20 reps;Theraband    Theraband Level (Shoulder Horizontal ABduction) Level 2 (Red)    Flexion Strengthening;Right   1 lb 2 x 10 (to fatigue), x 10 0 lbs     Shoulder Exercises: Seated   Row Strengthening;Both   3x10   Theraband Level (Shoulder Row) Level 3 (Green)      Shoulder Exercises: Sidelying   ABduction AROM;Right;20 reps      Shoulder Exercises: ROM/Strengthening   UBE (Upper Arm Bike) 3 mins fwd/back each lvl 2                    PT Short Term Goals - 03/09/20 1306      PT SHORT TERM GOAL #1   Title Patient will demonstrate independent use of home exercise program to maintain progress from in clinic treatments.    Baseline 03/09/2020: Pt's 1st return visit c cues for technique adjustments    Time 2    Period Weeks    Status On-going    Target Date 03/23/20             PT Long Term Goals - 03/16/20 1319      PT LONG TERM GOAL #1   Title Patient will  demonstrate/report pain at worst less than or equal to 2/10 to facilitate minimal limitation in daily activity secondary to pain symptoms.    Time 8    Period Weeks    Status On-going    Target Date 04/16/20      PT LONG TERM GOAL #2   Title Patient will demonstrate independent use of home exercise program to facilitate ability to maintain/progress functional gains from skilled physical therapy services.    Time 8    Period Weeks    Status On-going    Target Date 04/16/20      PT LONG TERM GOAL #3   Title Patient will demonstrate Rt [place] joint mobility WFL to facilitate usual self care, dressing, reaching overhead at PLOF s limitation due to symptoms.    Time 8    Period Weeks    Status Partially Met    Target Date 04/16/20      PT LONG TERM GOAL #4   Title Pt. will demonstrate Rt UE MMT equal to Lt to facilitate usual daily activity in home at [facility].    Time 8    Period Weeks    Status On-going    Target Date 04/16/20                 Plan - 03/16/20 1318    Clinical Impression Statement Pt. has demonstrated improved supine active range of motion in all directions as documented.  Fatigue still noted early in active and resisted activity and that impairment continued to be limiting in daily activity.  Skilled PT services warranted to continue to improve and progress.    Examination-Activity Limitations Carry;Hygiene/Grooming;Lift;Reach Overhead    Examination-Participation Restrictions Laundry;Other   household stuff   Stability/Clinical Decision Making Stable/Uncomplicated    Rehab Potential Fair    PT Frequency 2x / week    PT Duration 8 weeks    PT Treatment/Interventions ADLs/Self Care Home Management;Cryotherapy;Electrical Stimulation;Iontophoresis 38m/ml Dexamethasone;Moist Heat;Balance training;Therapeutic exercise;Therapeutic activities;Functional mobility training;Stair training;Gait training;Ultrasound;Neuromuscular re-education;Patient/family education;Manual  techniques;Dry needling;Passive range of motion;Spinal Manipulations;Joint Manipulations    PT Next Visit Plan Gravity reduced AROM, strengthening to improve elevation atttempts.    PT Home  Exercise Plan [id number]    Consulted and Agree with Plan of Care Patient           Patient will benefit from skilled therapeutic intervention in order to improve the following deficits and impairments:  Decreased endurance, Decreased activity tolerance, Decreased strength, Impaired UE functional use, Pain, Decreased balance, Decreased range of motion, Impaired perceived functional ability  Visit Diagnosis: Acute pain of right shoulder  Muscle weakness (generalized)  History of falling     Problem List Patient Active Problem List   Diagnosis Date Noted   Osteoporosis 01/31/2020   PCP NOTES >>>>> 05/26/2015   Anxiety 09/08/2014   Other malaise and fatigue 11/11/2013   Abnormal gait 09/02/2013   Idiopathic progressive polyneuropathy 09/02/2013   Annual physical exam 11/14/2011   Calculus of gallbladder without mention of cholecystitis or obstruction 05/17/2011   Diverticulosis of colon (without mention of hemorrhage) 05/17/2011   Diarrhea 03/08/2010   Hyperlipidemia 04/22/2008   ANEMIA, B12 DEFICIENCY 08/20/2007   Neuropathy 08/20/2007   OSTEOARTHRITIS 08/20/2007   DM II (diabetes mellitus, type II), controlled ([place]) 02/06/2007   Insomnia 02/06/2007    Scot Jun, PT, DPT, OCS, ATC 03/16/20  1:36 PM     [facility] [address] [place], [place], [address] Phone: [phone]   Fax:  [phone]  Name: Sierra Klein MRN: [id number] Date of Birth: 03-Apr-1932

## 2020-03-18 ENCOUNTER — Ambulatory Visit (INDEPENDENT_AMBULATORY_CARE_PROVIDER_SITE_OTHER): Payer: Medicare Other | Admitting: Rehabilitative and Restorative Service Providers"

## 2020-03-18 ENCOUNTER — Encounter: Payer: Self-pay | Admitting: Rehabilitative and Restorative Service Providers"

## 2020-03-18 ENCOUNTER — Other Ambulatory Visit: Payer: Self-pay

## 2020-03-18 DIAGNOSIS — M6281 Muscle weakness (generalized): Secondary | ICD-10-CM | POA: Diagnosis not present

## 2020-03-18 DIAGNOSIS — Z9181 History of falling: Secondary | ICD-10-CM

## 2020-03-18 DIAGNOSIS — M25511 Pain in right shoulder: Secondary | ICD-10-CM | POA: Diagnosis not present

## 2020-03-18 NOTE — Therapy (Signed)
[facility] [address] [place], [place], [address] Phone: [phone]   Fax:  [phone]  Physical Therapy Treatment  Patient Details  Name: Sierra Klein MRN: [id number] Date of Birth: Nov 19, 1931 Referring Provider (PT): Dr. Lynne Leader   Encounter Date: 03/18/2020   PT End of Session - 03/18/20 1302    Visit Number 5    Number of Visits 12    Date for PT Re-Evaluation 04/16/20    Authorization Type Medicare    Authorization - Visit Number 5    Progress Note Due on Visit 10    PT Start Time 6734    PT Stop Time 1338    PT Time Calculation (min) 39 min    Activity Tolerance Patient tolerated treatment well;Patient limited by fatigue    Behavior During Therapy [facility] for tasks assessed/performed           Past Medical History:  Diagnosis Date  . B12 deficiency anemia   . Cholelithiasis   . Colon polyps   . Diabetes mellitus   . Disturbance, sleep    narcolepsy  . Diverticulitis   . Hiatal hernia   . Hyperlipidemia    started med 6/09  . 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 Osteoarthritis     Past Surgical History:  Procedure Laterality Date  . ABDOMINAL HYSTERECTOMY     no oophorectomy  . BELPHAROPTOSIS REPAIR    . BREAST BIOPSY  09/1983   left  . CATARACT EXTRACTION     l- 9/09, r- 3/10  . CLOSED REDUCTION METACARPAL WITH PERCUTANEOUS PINNING Right 01/12/2015   Procedure: PINNING RIGHT 4 & 5 METACARPAL FRACTURES;  Surgeon: Milly Jakob, MD;  Location: [facility];  Service: Orthopedics;  Laterality: Right;  . FOOT SURGERY  03/1988   cyst removed from right heel  . LAPAROSCOPIC LYSIS OF ADHESIONS  2013    RLQ pain resolved after surgery  . pelvic surgery  2003   cysto-recto-enetero cele repair     There were no vitals filed for this visit.   Subjective Assessment - 03/18/20 1301    Subjective Pt. indicated feeling tired today due to  not sleeping (unrelated to shoulder).  Pt. stated she was able to pull blanket while making bed with less complaints/difficulty than before.    Limitations House hold activities    Diagnostic tests xrays performed    Patient Stated Goals Reduce pain, get arm use back    Currently in Pain? No/denies    Pain Score 0-No pain    Pain Onset 1 to 4 weeks ago                             [facility] Adult PT Treatment/Exercise - 03/18/20 0001      Shoulder Exercises: Seated   Other Seated Exercises green band lat pull down 3 x 10       Shoulder Exercises: Standing   External Rotation Strengthening;Theraband;Right   to fatigue x 30   Theraband Level (Shoulder External Rotation) Level 2 (Red)    Internal Rotation Right;20 reps;Theraband    Theraband Level (Shoulder Internal Rotation) Level 3 (Green)    Other Standing Exercises UE ranger flexion 2 x 10 c ipsilateral stepping      Shoulder Exercises: ROM/Strengthening   UBE (Upper Arm Bike) Lvl 2.5 3 mins fwd/back each way  PT Short Term Goals - 03/09/20 1306      PT SHORT TERM GOAL #1   Title Patient will demonstrate independent use of home exercise program to maintain progress from in clinic treatments.    Baseline 03/09/2020: Pt's 1st return visit c cues for technique adjustments    Time 2    Period Weeks    Status On-going    Target Date 03/23/20             PT Long Term Goals - 03/16/20 1319      PT LONG TERM GOAL #1   Title Patient will demonstrate/report pain at worst less than or equal to 2/10 to facilitate minimal limitation in daily activity secondary to pain symptoms.    Time 8    Period Weeks    Status On-going    Target Date 04/16/20      PT LONG TERM GOAL #2   Title Patient will demonstrate independent use of home exercise program to facilitate ability to maintain/progress functional gains from skilled physical therapy services.    Time 8    Period Weeks    Status  On-going    Target Date 04/16/20      PT LONG TERM GOAL #3   Title Patient will demonstrate Rt [place] joint mobility WFL to facilitate usual self care, dressing, reaching overhead at PLOF s limitation due to symptoms.    Time 8    Period Weeks    Status Partially Met    Target Date 04/16/20      PT LONG TERM GOAL #4   Title Pt. will demonstrate Rt UE MMT equal to Lt to facilitate usual daily activity in home at [facility].    Time 8    Period Weeks    Status On-going    Target Date 04/16/20                 Plan - 03/18/20 1329    Clinical Impression Statement Pt. demonstrated marked improvement in active mobility today.  Static balance c narrow base of support poor to fair and can be improved, implemented c HEP.    Examination-Activity Limitations Carry;Hygiene/Grooming;Lift;Reach Overhead    Examination-Participation Restrictions Laundry;Other   household stuff   Stability/Clinical Decision Making Stable/Uncomplicated    Rehab Potential Fair    PT Frequency 2x / week    PT Duration 8 weeks    PT Treatment/Interventions ADLs/Self Care Home Management;Cryotherapy;Electrical Stimulation;Iontophoresis 53m/ml Dexamethasone;Moist Heat;Balance training;Therapeutic exercise;Therapeutic activities;Functional mobility training;Stair training;Gait training;Ultrasound;Neuromuscular re-education;Patient/family education;Manual techniques;Dry needling;Passive range of motion;Spinal Manipulations;Joint Manipulations    PT Next Visit Plan Continue strnegthening of Rt UE, balance.    PT Home Exercise Plan [email or web address]    Consulted and Agree with Plan of Care Patient           Patient will benefit from skilled therapeutic intervention in order to improve the following deficits and impairments:  Decreased endurance, Decreased activity tolerance, Decreased strength, Impaired UE functional use, Pain, Decreased balance, Decreased range of motion, Impaired perceived functional ability  Visit  Diagnosis: Acute pain of right shoulder  Muscle weakness (generalized)  History of falling     Problem List Patient Active Problem List   Diagnosis Date Noted  . Osteoporosis 01/31/2020  . PCP NOTES >>>>> 05/26/2015  . Anxiety 09/08/2014  . Other malaise and fatigue 11/11/2013  . Abnormal gait 09/02/2013  . Idiopathic progressive polyneuropathy 09/02/2013  . Annual physical exam 11/14/2011  . Calculus of gallbladder without mention of cholecystitis or obstruction  05/17/2011  . Diverticulosis of colon (without mention of hemorrhage) 05/17/2011  . Diarrhea 03/08/2010  . Hyperlipidemia 04/22/2008  . ANEMIA, B12 DEFICIENCY 08/20/2007  . Neuropathy 08/20/2007  . OSTEOARTHRITIS 08/20/2007  . DM II (diabetes mellitus, type II), controlled ([place]) 02/06/2007  . Insomnia 02/06/2007    Scot Jun, PT, DPT, OCS, ATC 03/18/20  1:32 PM     [facility] [address] [place], [place], [address] Phone: [phone]   Fax:  [phone]  Name: Sierra Klein MRN: [id number] Date of Birth: 01-May-1932

## 2020-03-23 ENCOUNTER — Ambulatory Visit (INDEPENDENT_AMBULATORY_CARE_PROVIDER_SITE_OTHER): Payer: Medicare Other | Admitting: Rehabilitative and Restorative Service Providers"

## 2020-03-23 ENCOUNTER — Encounter: Payer: Self-pay | Admitting: Rehabilitative and Restorative Service Providers"

## 2020-03-23 ENCOUNTER — Other Ambulatory Visit: Payer: Self-pay

## 2020-03-23 DIAGNOSIS — M6281 Muscle weakness (generalized): Secondary | ICD-10-CM

## 2020-03-23 DIAGNOSIS — Z9181 History of falling: Secondary | ICD-10-CM | POA: Diagnosis not present

## 2020-03-23 DIAGNOSIS — M25511 Pain in right shoulder: Secondary | ICD-10-CM

## 2020-03-23 NOTE — Therapy (Signed)
[facility] [address] [place], [place], [address] Phone: [phone]   Fax:  [phone]  Physical Therapy Treatment  Patient Details  Name: Sierra Klein MRN: [id number] Date of Birth: [birth year] Referring Provider (PT): Dr. Lynne Leader   Encounter Date: 03/23/2020   PT End of Session - 03/23/20 1308    Visit Number 6    Number of Visits 12    Date for PT Re-Evaluation 04/16/20    Authorization Type Medicare    Authorization - Visit Number 6    Progress Note Due on Visit 10    PT Start Time 1300    PT Stop Time 1339    PT Time Calculation (min) 39 min    Activity Tolerance Patient tolerated treatment well;Patient limited by fatigue    Behavior During Therapy [facility] for tasks assessed/performed           Past Medical History:  Diagnosis Date  . B12 deficiency anemia   . Cholelithiasis   . Colon polyps   . Diabetes mellitus   . Disturbance, sleep    narcolepsy  . Diverticulitis   . Hiatal hernia   . Hyperlipidemia    started med 6/09  . 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 Osteoarthritis     Past Surgical History:  Procedure Laterality Date  . ABDOMINAL HYSTERECTOMY     no oophorectomy  . BELPHAROPTOSIS REPAIR    . BREAST BIOPSY  09/1983   left  . CATARACT EXTRACTION     l- 9/09, r- 3/10  . CLOSED REDUCTION METACARPAL WITH PERCUTANEOUS PINNING Right 01/12/2015   Procedure: PINNING RIGHT 4 & 5 METACARPAL FRACTURES;  Surgeon: Milly Jakob, MD;  Location: [facility];  Service: Orthopedics;  Laterality: Right;  . FOOT SURGERY  03/1988   cyst removed from right heel  . LAPAROSCOPIC LYSIS OF ADHESIONS  2013    RLQ pain resolved after surgery  . pelvic surgery  2003   cysto-recto-enetero cele repair     There were no vitals filed for this visit.   Subjective Assessment - 03/23/20 1305    Subjective Pt. reported arm is feeling ok today, no  specific complaints of pain increase reported upon arrival.    Limitations House hold activities    Diagnostic tests xrays performed    Patient Stated Goals Reduce pain, get arm use back    Currently in Pain? No/denies    Pain Score 0-No pain    Pain Onset 1 to 4 weeks ago                             [facility] Adult PT Treatment/Exercise - 03/23/20 0001      Neuro Re-ed    Neuro Re-ed Details  100 deg flexion rhythmic stabs in supine 30 sec bouts, er/ir in neutral 30 sec bouts      Shoulder Exercises: Supine   Flexion Strengthening;Right   to fatigue x 20, x 6   Shoulder Flexion Weight (lbs) 1      Shoulder Exercises: Sidelying   External Rotation AROM;Strengthening;Right   to fatigue x22, x21    External Rotation Weight (lbs) 1    ABduction AROM;Right;Strengthening   3 x 10   ABduction Weight (lbs) 1      Shoulder Exercises: Standing   Other Standing Exercises flexion to 100 deg x 10  Shoulder Exercises: ROM/Strengthening   UBE (Upper Arm Bike) Lvl 2 2 mins alt fwd/back for 8 mins total      Manual Therapy   Manual therapy comments g2-g3 inferior jt mobs in flexion, scaption                    PT Short Term Goals - 03/09/20 1306      PT SHORT TERM GOAL #1   Title Patient will demonstrate independent use of home exercise program to maintain progress from in clinic treatments.    Baseline 03/09/2020: Pt's 1st return visit c cues for technique adjustments    Time 2    Period Weeks    Status On-going    Target Date 03/23/20             PT Long Term Goals - 03/16/20 1319      PT LONG TERM GOAL #1   Title Patient will demonstrate/report pain at worst less than or equal to 2/10 to facilitate minimal limitation in daily activity secondary to pain symptoms.    Time 8    Period Weeks    Status On-going    Target Date 04/16/20      PT LONG TERM GOAL #2   Title Patient will demonstrate independent use of home exercise program to facilitate  ability to maintain/progress functional gains from skilled physical therapy services.    Time 8    Period Weeks    Status On-going    Target Date 04/16/20      PT LONG TERM GOAL #3   Title Patient will demonstrate Rt [place] joint mobility WFL to facilitate usual self care, dressing, reaching overhead at PLOF s limitation due to symptoms.    Time 8    Period Weeks    Status Partially Met    Target Date 04/16/20      PT LONG TERM GOAL #4   Title Pt. will demonstrate Rt UE MMT equal to Lt to facilitate usual daily activity in home at [facility].    Time 8    Period Weeks    Status On-going    Target Date 04/16/20                 Plan - 03/23/20 1329    Clinical Impression Statement Pt. performed approx. 75% of flexion movement prior to compensatory shrug and elbow flexion when performed against gravity today, showing improvement in ability.  Coming along slow and steady in pre intervention.    Examination-Activity Limitations Carry;Hygiene/Grooming;Lift;Reach Overhead    Examination-Participation Restrictions Laundry;Other   household stuff   Stability/Clinical Decision Making Stable/Uncomplicated    Rehab Potential Fair    PT Frequency 2x / week    PT Duration 8 weeks    PT Treatment/Interventions ADLs/Self Care Home Management;Cryotherapy;Electrical Stimulation;Iontophoresis 47m/ml Dexamethasone;Moist Heat;Balance training;Therapeutic exercise;Therapeutic activities;Functional mobility training;Stair training;Gait training;Ultrasound;Neuromuscular re-education;Patient/family education;Manual techniques;Dry needling;Passive range of motion;Spinal Manipulations;Joint Manipulations    PT Next Visit Plan Transition to gravity resisted strengthening.    PT Home Exercise Plan [id number]    Consulted and Agree with Plan of Care Patient           Patient will benefit from skilled therapeutic intervention in order to improve the following deficits and impairments:  Decreased endurance,  Decreased activity tolerance, Decreased strength, Impaired UE functional use, Pain, Decreased balance, Decreased range of motion, Impaired perceived functional ability  Visit Diagnosis: Acute pain of right shoulder  Muscle weakness (generalized)  History of falling  Problem List Patient Active Problem List   Diagnosis Date Noted  . Osteoporosis 01/31/2020  . PCP NOTES >>>>> 05/26/2015  . Anxiety 09/08/2014  . Other malaise and fatigue 11/11/2013  . Abnormal gait 09/02/2013  . Idiopathic progressive polyneuropathy 09/02/2013  . Annual physical exam 11/14/2011  . Calculus of gallbladder without mention of cholecystitis or obstruction 05/17/2011  . Diverticulosis of colon (without mention of hemorrhage) 05/17/2011  . Diarrhea 03/08/2010  . Hyperlipidemia 04/22/2008  . ANEMIA, B12 DEFICIENCY 08/20/2007  . Neuropathy 08/20/2007  . OSTEOARTHRITIS 08/20/2007  . DM II (diabetes mellitus, type II), controlled ([place]) 02/06/2007  . Insomnia 02/06/2007   Scot Jun, PT, DPT, OCS, ATC 03/23/20  1:32 PM    [facility] Physical Therapy [address] [place], [place], [address] Phone: [phone]   Fax:  [phone]  Name: Sierra Klein MRN: [id number] Date of Birth: January 13, 1932

## 2020-03-31 ENCOUNTER — Ambulatory Visit (INDEPENDENT_AMBULATORY_CARE_PROVIDER_SITE_OTHER): Payer: Medicare Other | Admitting: Internal Medicine

## 2020-03-31 ENCOUNTER — Encounter: Payer: Self-pay | Admitting: Internal Medicine

## 2020-03-31 ENCOUNTER — Other Ambulatory Visit: Payer: Self-pay

## 2020-03-31 VITALS — BP 126/79 | HR 80 | Temp 98.0°F | Resp 18 | Ht 64.0 in | Wt 135.5 lb

## 2020-03-31 DIAGNOSIS — R7989 Other specified abnormal findings of blood chemistry: Secondary | ICD-10-CM

## 2020-03-31 DIAGNOSIS — M810 Age-related osteoporosis without current pathological fracture: Secondary | ICD-10-CM | POA: Diagnosis not present

## 2020-03-31 DIAGNOSIS — K439 Ventral hernia without obstruction or gangrene: Secondary | ICD-10-CM | POA: Diagnosis not present

## 2020-03-31 NOTE — Assessment & Plan Note (Signed)
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 Explained the patient what this is, treatment options: - observation with avoidance of heavy lifting and Valsalva situations.  ER if pain, nausea, vomiting. -Surgical consultation for further advice, she really does not like to proceed with that option at this point. Elected observation, risk of incarceration carefully discussed, including indications for ER. Increase LFTs: To see gastroenterology in November Osteoporosis: See last visit, try Fosamax x2, the last time she had esophageal discomfort and stop taking it.  She is not inclined to try again.  We talked about Prolia and she will think about it.  Risk of osteoporosis discussed Fractures).

## 2020-03-31 NOTE — Progress Notes (Signed)
Pre visit review using our clinic review tool, if applicable. No additional management support is needed unless otherwise documented below in the visit note. 

## 2020-03-31 NOTE — Progress Notes (Signed)
Subjective:    Patient ID: Sierra Klein, female    DOB: 02-Jan-1932, 86 y.o.   MRN: [id number]  DOS:  03/31/2020 Type of visit - description: Acute 8 days ago noted a lump at the right side of the abdominal wall, the size of "half egg". She noted it twice and she was able to push the lump in. The 3th and last time she experience the lump it was the size of a whole egg and again she was able to push it in. Otherwise the area is not swollen    Review of Systems Denies fever chills.  No nausea or vomiting.  No rash  Past Medical History:  Diagnosis Date  . B12 deficiency anemia   . Cholelithiasis   . Colon polyps   . Diabetes mellitus   . Disturbance, sleep    narcolepsy  . Diverticulitis   . Hiatal hernia   . Hyperlipidemia    started med 6/09  . 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 Osteoarthritis     Past Surgical History:  Procedure Laterality Date  . ABDOMINAL HYSTERECTOMY     no oophorectomy  . BELPHAROPTOSIS REPAIR    . BREAST BIOPSY  09/1983   left  . CATARACT EXTRACTION     l- 9/09, r- 3/10  . CLOSED REDUCTION METACARPAL WITH PERCUTANEOUS PINNING Right 01/12/2015   Procedure: PINNING RIGHT 4 & 5 METACARPAL FRACTURES;  Surgeon: Milly Jakob, MD;  Location: [facility];  Service: Orthopedics;  Laterality: Right;  . FOOT SURGERY  03/1988   cyst removed from right heel  . LAPAROSCOPIC LYSIS OF ADHESIONS  2013    RLQ pain resolved after surgery  . pelvic surgery  2003   cysto-recto-enetero cele repair     Allergies as of 03/31/2020      Reactions   Latex    Sulfonamide Derivatives Hives      Medication List       Accurate as of March 31, 2020  3:45 PM. If you have any questions, ask your nurse or doctor.        alendronate 70 MG tablet Commonly known as: FOSAMAX Take 1 tablet (70 mg total) by mouth once a week. Take with a full glass of water on an empty stomach.    calcium carbonate 1250 MG capsule Take 1,250 mg by mouth 2 (two) times daily with a meal.   cetirizine 10 MG tablet Commonly known as: ZYRTEC Take 1 tablet (10 mg total) by mouth daily.   cyanocobalamin 1000 MCG tablet Take 100 mcg by mouth daily.   diclofenac sodium 1 % Gel Commonly known as: VOLTAREN Apply 2 g topically as needed.   diphenoxylate-atropine 2.5-0.025 MG tablet Commonly known as: LOMOTIL TAKE 1 TABLET BY MOUTH THREE TIMES DAILY AS NEEDED FOR DIARRHEA   DULoxetine 60 MG capsule Commonly known as: CYMBALTA Take 60 mg by mouth daily.   gabapentin 600 MG tablet Commonly known as: NEURONTIN Take 600 mg by mouth 3 (three) times daily.   multivitamin per tablet Take 1 tablet by mouth daily.   Norco 5-325 MG tablet Generic drug: HYDROcodone-acetaminophen Take by mouth.   Nuvigil 150 MG tablet Generic drug: Armodafinil Take 150 mg by mouth daily as needed.   timolol 0.5 % ophthalmic solution Commonly known as: BETIMOL Place 1 drop into both eyes daily.   VITAMIN D-3 PO Take 1 capsule by mouth daily.  Objective:   Physical Exam Abdominal:      BP 126/79 (BP Location: Left Arm, Patient Position: Sitting, Cuff Size: Small)   Pulse 80   Temp 98 F (36.7 C) (Oral)   Resp 18   Ht 5\' 4"  (1.626 m)   Wt 135 lb 8 oz (61.5 kg)   SpO2 97%   BMI 23.26 kg/m  General:   Well developed, NAD, BMI noted. HEENT:  Normocephalic . Face symmetric, atraumatic Abdomen: Soft, not tender, no inguinal hernia.  See graphic. Lower extremities: no pretibial edema bilaterally  Skin: Not pale. Not jaundice Neurologic:  alert & oriented X3.  Speech normal, gait appropriate for age and unassisted Psych--  Cognition and judgment appear intact.  Cooperative with normal attention span and concentration.  Behavior appropriate. No anxious or depressed appearing.      Assessment     Assessment DM Hyperlipidemia Depression, insomnia b 12  deficiency Neuro: -H/o vertigo -Narcolepsy Dr Bertrum Sol -Neuropathy, 1998, not related to DM, workup negative, idiopathic. Dr Jannifer Franklin [facility]; hydrocodone per Dr Jannifer Franklin Chronic L ankle edema DJD   Wrist Fx 12-2014 DEXA 12-07 (-) and 7-10 normal; T score -1.8 (07-2015) GI:   HH; colon polyps;  Diverticulitis;  cholelithiasis per CT 2012  PLAN: Spigelian hernia: Suspect spigelian hernia on clinical grounds,  I have not been able to find hernia on exam today though. Explained the patient what this is, treatment options: - observation with avoidance of heavy lifting and Valsalva situations.  ER if pain, nausea, vomiting. -Surgical consultation for further advice, she really does not like to proceed with that option at this point. Elected observation, risk of incarceration carefully discussed, including indications for ER. Increase LFTs: To see gastroenterology in November Osteoporosis: See last visit, try Fosamax x2, the last time she had esophageal discomfort and stop taking it.  She is not inclined to try again.  We talked about Prolia and she will think about it.  Risk of osteoporosis discussed Fractures).    This visit occurred during the SARS-CoV-2 public health emergency.  Safety protocols were in place, including screening questions prior to the visit, additional usage of staff PPE, and extensive cleaning of exam room while observing appropriate contact time as indicated for disinfecting solutions.

## 2020-03-31 NOTE — Patient Instructions (Signed)
PROLIA Denosumab injection What is this medicine? DENOSUMAB (den oh sue mab) slows bone breakdown. Prolia is used to treat osteoporosis in women after menopause and in men, and in people who are taking corticosteroids for 6 months or more. Delton See is used to treat a high calcium level due to cancer and to prevent bone fractures and other bone problems caused by multiple myeloma or cancer bone metastases. Delton See is also used to treat giant cell tumor of the bone. This medicine may be used for other purposes; ask your health care provider or pharmacist if you have questions. COMMON BRAND NAME(S): Prolia, XGEVA What should I tell my health care provider before I take this medicine? They need to know if you have any of these conditions:  dental disease  having surgery or tooth extraction  infection  kidney disease  low levels of calcium or Vitamin D in the blood  malnutrition  on hemodialysis  skin conditions or sensitivity  thyroid or parathyroid disease  an unusual reaction to denosumab, other medicines, foods, dyes, or preservatives  pregnant or trying to get pregnant  breast-feeding How should I use this medicine? This medicine is for injection under the skin. It is given by a health care professional in a hospital or clinic setting. A special MedGuide will be given to you before each treatment. Be sure to read this information carefully each time. For Prolia, talk to your pediatrician regarding the use of this medicine in children. Special care may be needed. For Delton See, talk to your pediatrician regarding the use of this medicine in children. While this drug may be prescribed for children as young as 13 years for selected conditions, precautions do apply. Overdosage: If you think you have taken too much of this medicine contact a poison control center or emergency room at once. NOTE: This medicine is only for you. Do not share this medicine with others. What if I miss a dose? It  is important not to miss your dose. Call your doctor or health care professional if you are unable to keep an appointment. What may interact with this medicine? Do not take this medicine with any of the following medications:  other medicines containing denosumab This medicine may also interact with the following medications:  medicines that lower your chance of fighting infection  steroid medicines like prednisone or cortisone This list may not describe all possible interactions. Give your health care provider a list of all the medicines, herbs, non-prescription drugs, or dietary supplements you use. Also tell them if you smoke, drink alcohol, or use illegal drugs. Some items may interact with your medicine. What should I watch for while using this medicine? Visit your doctor or health care professional for regular checks on your progress. Your doctor or health care professional may order blood tests and other tests to see how you are doing. Call your doctor or health care professional for advice if you get a fever, chills or sore throat, or other symptoms of a cold or flu. Do not treat yourself. This drug may decrease your body's ability to fight infection. Try to avoid being around people who are sick. You should make sure you get enough calcium and vitamin D while you are taking this medicine, unless your doctor tells you not to. Discuss the foods you eat and the vitamins you take with your health care professional. See your dentist regularly. Brush and floss your teeth as directed. Before you have any dental work done, tell your dentist you  are receiving this medicine. Do not become pregnant while taking this medicine or for 5 months after stopping it. Talk with your doctor or health care professional about your birth control options while taking this medicine. Women should inform their doctor if they wish to become pregnant or think they might be pregnant. There is a potential for serious side  effects to an unborn child. Talk to your health care professional or pharmacist for more information. What side effects may I notice from receiving this medicine? Side effects that you should report to your doctor or health care professional as soon as possible:  allergic reactions like skin rash, itching or hives, swelling of the face, lips, or tongue  bone pain  breathing problems  dizziness  jaw pain, especially after dental work  redness, blistering, peeling of the skin  signs and symptoms of infection like fever or chills; cough; sore throat; pain or trouble passing urine  signs of low calcium like fast heartbeat, muscle cramps or muscle pain; pain, tingling, numbness in the hands or feet; seizures  unusual bleeding or bruising  unusually weak or tired Side effects that usually do not require medical attention (report to your doctor or health care professional if they continue or are bothersome):  constipation  diarrhea  headache  joint pain  loss of appetite  muscle pain  runny nose  tiredness  upset stomach This list may not describe all possible side effects. Call your doctor for medical advice about side effects. You may report side effects to FDA at 1-800-FDA-1088. Where should I keep my medicine? This medicine is only given in a clinic, doctor's office, or other health care setting and will not be stored at home. NOTE: This sheet is a summary. It may not cover all possible information. If you have questions about this medicine, talk to your doctor, pharmacist, or health care provider.  2020 Elsevier/Gold Standard (2017-10-19 16:10:44)

## 2020-04-08 ENCOUNTER — Encounter: Payer: Medicare Other | Admitting: Physical Therapy

## 2020-04-10 ENCOUNTER — Ambulatory Visit: Payer: Medicare Other | Attending: Internal Medicine

## 2020-04-10 DIAGNOSIS — Z23 Encounter for immunization: Secondary | ICD-10-CM

## 2020-04-10 NOTE — Progress Notes (Signed)
   Covid-19 Vaccination Clinic  Name:  AREEJ TAYLER    MRN: [id number] DOB: 1931/08/19  04/10/2020  Ms. Rincon was observed post Covid-19 immunization for 15 minutes without incident. She was provided with Vaccine Information Sheet and instruction to access the V-Safe system.   Ms. Fajardo was instructed to call 911 with any severe reactions post vaccine: Marland Kitchen Difficulty breathing  . Swelling of face and throat  . A fast heartbeat  . A bad rash all over body  . Dizziness and weakness

## 2020-04-12 ENCOUNTER — Encounter: Payer: Medicare Other | Admitting: Rehabilitative and Restorative Service Providers"

## 2020-04-12 ENCOUNTER — Telehealth: Payer: Self-pay | Admitting: Rehabilitative and Restorative Service Providers"

## 2020-04-12 NOTE — Telephone Encounter (Signed)
Called and left message after no show for appointment time today.   Scot Jun, PT, DPT, OCS, ATC 04/12/20  1:20 PM

## 2020-04-19 ENCOUNTER — Ambulatory Visit (INDEPENDENT_AMBULATORY_CARE_PROVIDER_SITE_OTHER): Payer: Medicare Other | Admitting: Rehabilitative and Restorative Service Providers"

## 2020-04-19 ENCOUNTER — Other Ambulatory Visit: Payer: Self-pay

## 2020-04-19 DIAGNOSIS — M25511 Pain in right shoulder: Secondary | ICD-10-CM

## 2020-04-19 DIAGNOSIS — M6281 Muscle weakness (generalized): Secondary | ICD-10-CM

## 2020-04-19 DIAGNOSIS — Z9181 History of falling: Secondary | ICD-10-CM

## 2020-04-19 NOTE — Therapy (Signed)
[facility] Physical Therapy [address] [place], [place], [address] Phone: [phone]   Fax:  [phone]  Physical Therapy Discharge  Patient Details  Name: Sierra Klein MRN: [id number] Date of Birth: 12-09-1931 Referring Provider (PT): Dr. Lynne Leader   Encounter Date: 04/19/2020   PT End of Session - 04/19/20 1314    Visit Number 6    Number of Visits 12    Date for PT Re-Evaluation 04/16/20    Authorization Type Medicare    Authorization - Visit Number 6    Progress Note Due on Visit 10    PT Start Time 1164    PT Stop Time 1310    PT Time Calculation (min) 5 min           Past Medical History:  Diagnosis Date  . B12 deficiency anemia   . Cholelithiasis   . Colon polyps   . Diabetes mellitus   . Disturbance, sleep    narcolepsy  . Diverticulitis   . Hiatal hernia   . Hyperlipidemia    started med 6/09  . 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 Osteoarthritis     Past Surgical History:  Procedure Laterality Date  . ABDOMINAL HYSTERECTOMY     no oophorectomy  . BELPHAROPTOSIS REPAIR    . BREAST BIOPSY  09/1983   left  . CATARACT EXTRACTION     l- 9/09, r- 3/10  . CLOSED REDUCTION METACARPAL WITH PERCUTANEOUS PINNING Right 01/12/2015   Procedure: PINNING RIGHT 4 & 5 METACARPAL FRACTURES;  Surgeon: Milly Jakob, MD;  Location: [facility] [place];  Service: Orthopedics;  Laterality: Right;  . FOOT SURGERY  03/1988   cyst removed from right heel  . LAPAROSCOPIC LYSIS OF ADHESIONS  2013    RLQ pain resolved after surgery  . pelvic surgery  2003   cysto-recto-enetero cele repair     There were no vitals filed for this visit.   Subjective Assessment - 04/19/20 1312    Subjective Pt. arrived stating still feeling after effects from COVID booster shot.  Pt. indicated shoulder was well prior to booster.    Limitations House hold activities    Diagnostic tests  xrays performed    Patient Stated Goals Reduce pain, get arm use back    Pain Onset 1 to 4 weeks ago                                       PT Short Term Goals - 03/09/20 1306      PT SHORT TERM GOAL #1   Title Patient will demonstrate independent use of home exercise program to maintain progress from in clinic treatments.    Baseline 03/09/2020: Pt's 1st return visit c cues for technique adjustments    Time 2    Period Weeks    Status On-going    Target Date 03/23/20             PT Long Term Goals - 03/16/20 1319      PT LONG TERM GOAL #1   Title Patient will demonstrate/report pain at worst less than or equal to 2/10 to facilitate minimal limitation in daily activity secondary to pain symptoms.    Time 8    Period Weeks    Status On-going    Target Date 04/16/20  PT LONG TERM GOAL #2   Title Patient will demonstrate independent use of home exercise program to facilitate ability to maintain/progress functional gains from skilled physical therapy services.    Time 8    Period Weeks    Status On-going    Target Date 04/16/20      PT LONG TERM GOAL #3   Title Patient will demonstrate Rt [place] joint mobility WFL to facilitate usual self care, dressing, reaching overhead at PLOF s limitation due to symptoms.    Time 8    Period Weeks    Status Partially Met    Target Date 04/16/20      PT LONG TERM GOAL #4   Title Pt. will demonstrate Rt UE MMT equal to Lt to facilitate usual daily activity in home at [facility].    Time 8    Period Weeks    Status On-going    Target Date 04/16/20                 Plan - 04/19/20 1313    Clinical Impression Statement No treatment performed today secondary to post vaccine side effects.  At this time, Pt. is appropriate for d/c to HEP due to reported improvements. (expired POC)    Examination-Activity Limitations Carry;Hygiene/Grooming;Lift;Reach Overhead    Examination-Participation Restrictions  Laundry;Other   household stuff   Stability/Clinical Decision Making Stable/Uncomplicated    Rehab Potential Fair    PT Frequency 2x / week    PT Duration 8 weeks    PT Treatment/Interventions ADLs/Self Care Home Management;Cryotherapy;Electrical Stimulation;Iontophoresis 48m/ml Dexamethasone;Moist Heat;Balance training;Therapeutic exercise;Therapeutic activities;Functional mobility training;Stair training;Gait training;Ultrasound;Neuromuscular re-education;Patient/family education;Manual techniques;Dry needling;Passive range of motion;Spinal Manipulations;Joint Manipulations    PT Next Visit Plan D/C to HEP    PT Home Exercise Plan [id number]    Consulted and Agree with Plan of Care Patient           Patient will benefit from skilled therapeutic intervention in order to improve the following deficits and impairments:  Decreased endurance, Decreased activity tolerance, Decreased strength, Impaired UE functional use, Pain, Decreased balance, Decreased range of motion, Impaired perceived functional ability  Visit Diagnosis: Acute pain of right shoulder  Muscle weakness (generalized)  History of falling     Problem List Patient Active Problem List   Diagnosis Date Noted  . Osteoporosis 01/31/2020  . PCP NOTES >>>>> 05/26/2015  . Anxiety 09/08/2014  . Other malaise and fatigue 11/11/2013  . Abnormal gait 09/02/2013  . Idiopathic progressive polyneuropathy 09/02/2013  . Annual physical exam 11/14/2011  . Calculus of gallbladder without mention of cholecystitis or obstruction 05/17/2011  . Diverticulosis of colon (without mention of hemorrhage) 05/17/2011  . Diarrhea 03/08/2010  . Hyperlipidemia 04/22/2008  . ANEMIA, B12 DEFICIENCY 08/20/2007  . Neuropathy 08/20/2007  . OSTEOARTHRITIS 08/20/2007  . DM II (diabetes mellitus, type II), controlled ([place] 02/06/2007  . Insomnia 02/06/2007   PHYSICAL THERAPY DISCHARGE SUMMARY  Visits from Start of Care: 6  Current functional level  related to goals / functional outcomes: See notes   Remaining deficits: See notes   Education / Equipment: HEP  Plan: Patient agrees to discharge.  Patient goals were partially met. Patient is being discharged due to being pleased with the current functional level.  ?????     MScot Jun PT, DPT, OCS, ATC 04/19/20  1:15 PM      [facility] [address] [address]Phone: [phone]  Fax:  [phone] Name: Sierra Klein  MRN: [id number] Date of Birth: [birth year]

## 2020-04-22 ENCOUNTER — Encounter: Payer: Self-pay | Admitting: Family Medicine

## 2020-04-22 ENCOUNTER — Other Ambulatory Visit: Payer: Self-pay

## 2020-04-22 ENCOUNTER — Ambulatory Visit (INDEPENDENT_AMBULATORY_CARE_PROVIDER_SITE_OTHER): Payer: Medicare Other | Admitting: Family Medicine

## 2020-04-22 VITALS — BP 120/68 | HR 68 | Ht 64.0 in | Wt 129.4 lb

## 2020-04-22 DIAGNOSIS — M25511 Pain in right shoulder: Secondary | ICD-10-CM

## 2020-04-22 NOTE — Patient Instructions (Signed)
Thank you for coming in today.  Recheck with me as needed.   Continue home exercises.   If needed could repeat PT in the future.

## 2020-04-22 NOTE — Progress Notes (Signed)
   I, Sierra Klein, LAT, ATC, am serving as scribe for Dr. Lynne Klein.  Sierra Klein is a 86 y.o. female who presents to [place] at [facility] today for f/u of R shoulder pain.  She was last seen by Dr. Georgina Snell on 03/11/20 and noted improvement in her symptoms and was advised to con't PT.  She has now completed 6 PT sessions and has been d/c from PT.  Since her last visit, pt reports that her R shoulder is doing better but it will con't to intermittently catch.  She rates her improvement at 85%.  She was provided a HEP at PT and is doing those on a consistent basis.  Diagnostic testing: R shoulder XR- 02/05/20   Pertinent review of systems: No fevers or chills  Relevant historical information: Diabetes   Exam:  BP 120/68 (BP Location: Right Arm, Patient Position: Sitting, Cuff Size: Normal)   Pulse 68   Ht 5\' 4"  (1.626 m)   Wt 129 lb 6.4 oz (58.7 kg)   SpO2 95%   BMI 22.21 kg/m  General: Well Developed, well nourished, and in no acute distress.   MSK: Right shoulder normal-appearing normal motion.  Strength 4+/5 abduction and external rotation otherwise normal.    Assessment and Plan: 86 y.o. female with right shoulder pain significant improvement with physical therapy.  Plan to continue home exercise program and recheck back with me as needed.  Discussed precautions and backup plan.   Discussed warning signs or symptoms. Please see discharge instructions. Patient expresses understanding.   The above documentation has been reviewed and is accurate and complete Sierra Klein, M.D.    Total encounter time 20 minutes including face-to-face time with the patient and, reviewing past medical record, and charting on the date of service.   Plan and options

## 2020-04-27 ENCOUNTER — Encounter: Payer: Medicare Other | Admitting: Rehabilitative and Restorative Service Providers"

## 2020-04-28 ENCOUNTER — Other Ambulatory Visit (INDEPENDENT_AMBULATORY_CARE_PROVIDER_SITE_OTHER): Payer: Medicare Other

## 2020-04-28 ENCOUNTER — Ambulatory Visit (INDEPENDENT_AMBULATORY_CARE_PROVIDER_SITE_OTHER): Payer: Medicare Other | Admitting: Internal Medicine

## 2020-04-28 ENCOUNTER — Encounter: Payer: Self-pay | Admitting: Internal Medicine

## 2020-04-28 VITALS — BP 100/50 | HR 76 | Ht 63.25 in | Wt 128.5 lb

## 2020-04-28 DIAGNOSIS — R197 Diarrhea, unspecified: Secondary | ICD-10-CM

## 2020-04-28 DIAGNOSIS — R634 Abnormal weight loss: Secondary | ICD-10-CM

## 2020-04-28 DIAGNOSIS — R7989 Other specified abnormal findings of blood chemistry: Secondary | ICD-10-CM

## 2020-04-28 DIAGNOSIS — R945 Abnormal results of liver function studies: Secondary | ICD-10-CM

## 2020-04-28 LAB — HEPATIC FUNCTION PANEL
ALT: 22 U/L (ref 0–35)
AST: 20 U/L (ref 0–37)
Albumin: 3.5 g/dL (ref 3.5–5.2)
Alkaline Phosphatase: 270 U/L — ABNORMAL HIGH (ref 39–117)
Bilirubin, Direct: 0.4 mg/dL — ABNORMAL HIGH (ref 0.0–0.3)
Total Bilirubin: 1.2 mg/dL (ref 0.2–1.2)
Total Protein: 6.1 g/dL (ref 6.0–8.3)

## 2020-04-28 LAB — PROTIME-INR
INR: 1.1 ratio — ABNORMAL HIGH (ref 0.8–1.0)
Prothrombin Time: 12.2 s (ref 9.6–13.1)

## 2020-04-28 NOTE — Progress Notes (Signed)
HISTORY OF PRESENT ILLNESS:  Sierra Klein is a 86 y.o. female with a history of chronic diarrhea presents today for follow-up regarding elevated liver test.  She was evaluated in May 2021 for the same.  Extensive work-up was unrevealing.  Mild nonspecific elevation of ANA with normal ASMA noted.  Most recent liver tests were normal except for alkaline phosphatase of 252.  Prothrombin time is normal.  She denies pruritus.  Abdominal pain.  She is noted to have incidental gallstones.  Ultrasound April 2021 revealed echo characteristics of fatty liver.  She also has a history of diverticulosis and diverticulitis.  Last colonoscopy 2012.  Patient tells me that she has been having worsening diarrhea for the past 6 months.  Almost exclusively postprandial.  She states that her stools float.  She has lost weight, approximately 15 pounds since her last visit 6 months ago.  She has completed her Covid vaccination series.  REVIEW OF SYSTEMS:  All non-GI ROS negative as otherwise stated in the HPI except for urinary leakage, sinus allergy troubles, fatigue  Past Medical History:  Diagnosis Date  . B12 deficiency anemia   . Cholelithiasis   . Colon polyps   . Diabetes mellitus   . Disturbance, sleep    narcolepsy  . Diverticulitis   . Hiatal hernia   . Hyperlipidemia    started med 6/09  . 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 Osteoarthritis     Past Surgical History:  Procedure Laterality Date  . ABDOMINAL HYSTERECTOMY     no oophorectomy  . BELPHAROPTOSIS REPAIR    . BREAST BIOPSY  09/1983   left  . CATARACT EXTRACTION     l- 9/09, r- 3/10  . CLOSED REDUCTION METACARPAL WITH PERCUTANEOUS PINNING Right 01/12/2015   Procedure: PINNING RIGHT 4 & 5 METACARPAL FRACTURES;  Surgeon: Milly Jakob, MD;  Location: [place];  Service: Orthopedics;  Laterality: Right;  . FOOT SURGERY  03/1988   cyst removed from  right heel  . LAPAROSCOPIC LYSIS OF ADHESIONS  2013    RLQ pain resolved after surgery  . pelvic surgery  2003   cysto-recto-enetero cele repair     Social History Sierra Klein  reports that she quit smoking about 65 years ago. Her smoking use included cigarettes. She has never used smokeless tobacco. She reports current alcohol use of about 4.0 standard drinks of alcohol per week. She reports that she does not use drugs.  family history includes Alzheimer's disease in her mother; Coronary artery disease in her brother, mother, and sister; Diabetes in an other family member; Heart disease in her sister; Hyperlipidemia in her brother; Hypertension in her brother and brother.  Allergies  Allergen Reactions  . Latex   . Sulfonamide Derivatives Hives       PHYSICAL EXAMINATION: Vital signs: BP (!) 100/50 (BP Location: Left Arm, Patient Position: Sitting, Cuff Size: Normal)   Pulse 76   Ht 5' 3.25" (1.607 m) Comment: height measured without shoes  Wt 128 lb 8 oz (58.3 kg)   BMI 22.58 kg/m   Constitutional: generally well-appearing, no acute distress Psychiatric: alert and oriented x3, cooperative Eyes: extraocular movements intact, anicteric, conjunctiva pink Mouth: oral pharynx moist, no lesions Neck: supple no lymphadenopathy Cardiovascular: heart regular rate and rhythm, no murmur Lungs: clear to auscultation bilaterally Abdomen: soft, nontender, nondistended, no obvious ascites, no peritoneal signs, normal bowel sounds, no organomegaly Rectal: Omitted  Extremities: no clubbing, cyanosis, or lower extremity edema bilaterally Skin: no lesions on visible extremities Neuro: No focal deficits.  Cranial nerves intact  ASSESSMENT:  1.  Elevated alkaline phosphatase with negative laboratory and ultrasound evaluations.  No evidence for biliary obstruction or space-occupying lesion.  Could have some type of cholangiopathy.  No evidence of impaired liver function in this  86 year old. 2.  Worsening postprandial diarrhea and weight loss.  Describes what sounds like stearrhea. Pancreatic insufficiency 3.  General medical problems 4.  Diverticulosis with history of diverticulitis.  Last colonoscopy 2012 5.  Incidental cholelithiasis   PLAN:  1.  Repeat liver tests, PT/INR daily 2.  Provide the patient with Creon.  Take 2 with meals and snacks to see if this helps diarrhea.  I have asked her to contact the office in 2 weeks to give [place] follow-up.  We can prescribe pancreatic enzyme supplements if this proves helpful. 3.  Routine GI follow-up 1 year.  Sooner if needed Total time of 30 minutes was spent preparing to see the patient, reviewing test and x-rays, obtaining comprehensive history, performing comprehensive physical exam, counseling the patient regarding her above listed issues, ordering medications and laboratory tests, and documenting clinical information in the health record

## 2020-04-28 NOTE — Patient Instructions (Signed)
Your provider has requested that you go to the basement level for lab work before leaving today. Press "B" on the elevator. The lab is located at the first door on the left as you exit the elevator.  You have been given some samples of Creon - take 2 with each meal and 1 with a snack.  If this works well for you we can try to send in a prescription

## 2020-04-29 ENCOUNTER — Telehealth: Payer: Self-pay | Admitting: Internal Medicine

## 2020-04-29 NOTE — Telephone Encounter (Signed)
Pt is requesting a call back from a nurse to obtain a prescription for Creon since it is working for her.

## 2020-04-30 MED ORDER — PANCRELIPASE (LIP-PROT-AMYL) 36000-114000 UNITS PO CPEP
ORAL_CAPSULE | ORAL | 3 refills | Status: DC
Start: 2020-04-30 — End: 2020-05-04

## 2020-04-30 NOTE — Telephone Encounter (Signed)
Sent rx for Creon to [facility].  Sent patient mychart message letting her know.

## 2020-05-03 NOTE — Telephone Encounter (Signed)
Pt is requesting the Creon be sent to her pharmacy instead of [facility].  [place]:  [place], [place],  [id number]

## 2020-05-04 MED ORDER — PANCRELIPASE (LIP-PROT-AMYL) 36000-114000 UNITS PO CPEP: ORAL_CAPSULE | ORAL | 3 refills | Status: DC

## 2020-05-04 NOTE — Telephone Encounter (Signed)
Resent Creon to [facility] per patient's request

## 2020-05-04 NOTE — Addendum Note (Signed)
Addended by: Audrea Muscat on: 05/04/2020 08:40 AM   Modules accepted: Orders

## 2020-05-05 ENCOUNTER — Telehealth: Payer: Self-pay | Admitting: Internal Medicine

## 2020-05-06 NOTE — Telephone Encounter (Signed)
Spoke with patient and told her I would put some more samples up front for her to pick up.  Asked her to let me know if there were any problems with the prescription I sent.  Patient agreed.

## 2020-05-07 ENCOUNTER — Telehealth: Payer: Self-pay

## 2020-05-07 NOTE — Telephone Encounter (Signed)
I called the patient to schedule her yearly Medicare visit with nurse [facility] Coach).  If the patient calls back, please schedule Medicare Wellness Visit with Health Coach at next available opening, preferably in office but can be video or telephone. Last AWV 04/08/2019

## 2020-05-12 NOTE — Telephone Encounter (Signed)
Pt is wanting to inform the nurse the CREON medication is doing well but she is almost out of samples once more, she would like some more if possible.

## 2020-05-14 ENCOUNTER — Telehealth: Payer: Self-pay | Admitting: Internal Medicine

## 2020-05-14 NOTE — Telephone Encounter (Signed)
Spoke with pharmacy who clarified that patient's Creon had been filled and was ready to picked up.  Price was $60.  Let patient know

## 2020-05-14 NOTE — Telephone Encounter (Signed)
This should go to [facility], thanks.

## 2020-05-23 ENCOUNTER — Encounter: Payer: Self-pay | Admitting: Internal Medicine

## 2020-05-24 ENCOUNTER — Telehealth: Payer: Self-pay | Admitting: *Deleted

## 2020-05-24 DIAGNOSIS — K439 Ventral hernia without obstruction or gangrene: Secondary | ICD-10-CM

## 2020-05-24 NOTE — Telephone Encounter (Signed)
-----   Message from Colon Branch, MD sent at 05/24/2020 10:15 AM EST ----- Regarding: Enter a referral for general surgery, DX Spigelian hernia.

## 2020-05-24 NOTE — Telephone Encounter (Signed)
Patient states she is very concerned and would like to know what to do.

## 2020-05-24 NOTE — Telephone Encounter (Signed)
Referral placed.

## 2020-05-25 ENCOUNTER — Encounter: Payer: Self-pay | Admitting: Internal Medicine

## 2020-06-01 ENCOUNTER — Encounter: Payer: Self-pay | Admitting: Internal Medicine

## 2020-06-01 ENCOUNTER — Ambulatory Visit (INDEPENDENT_AMBULATORY_CARE_PROVIDER_SITE_OTHER): Payer: Medicare Other | Admitting: Internal Medicine

## 2020-06-01 ENCOUNTER — Other Ambulatory Visit: Payer: Self-pay

## 2020-06-01 VITALS — BP 116/70 | HR 83 | Temp 97.6°F | Ht 63.5 in | Wt 128.4 lb

## 2020-06-01 DIAGNOSIS — K8689 Other specified diseases of pancreas: Secondary | ICD-10-CM | POA: Diagnosis not present

## 2020-06-01 DIAGNOSIS — R7989 Other specified abnormal findings of blood chemistry: Secondary | ICD-10-CM | POA: Diagnosis not present

## 2020-06-01 DIAGNOSIS — K439 Ventral hernia without obstruction or gangrene: Secondary | ICD-10-CM | POA: Diagnosis not present

## 2020-06-01 DIAGNOSIS — Z23 Encounter for immunization: Secondary | ICD-10-CM

## 2020-06-01 NOTE — Patient Instructions (Signed)
General surgeon's office.  See if they can get you sooner than July 01, 2020. Their number:  [phone]  GO TO THE FRONT DESK, PLEASE SCHEDULE YOUR APPOINTMENTS Come back for a checkup in 6 months

## 2020-06-01 NOTE — Progress Notes (Signed)
Subjective:    Patient ID: Sierra Klein, female    DOB: 05/19/1932, 86 y.o.   MRN: [id number]  DOS:  06/01/2020 Type of visit - description: f/u  In general feels well. Continue with an abdominal hernia, so far reducible, no nausea, vomiting or blood in the stools. Note from GI reviewed.   Review of Systems See above   Past Medical History:  Diagnosis Date  . B12 deficiency anemia   . Cholelithiasis   . Colon polyps   . Diabetes mellitus   . Disturbance, sleep    narcolepsy  . Diverticulitis   . Hiatal hernia   . Hyperlipidemia    started med 6/09  . 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 Osteoarthritis     Past Surgical History:  Procedure Laterality Date  . ABDOMINAL HYSTERECTOMY     no oophorectomy  . BELPHAROPTOSIS REPAIR    . BREAST BIOPSY  09/1983   left  . CATARACT EXTRACTION     l- 9/09, r- 3/10  . CLOSED REDUCTION METACARPAL WITH PERCUTANEOUS PINNING Right 01/12/2015   Procedure: PINNING RIGHT 4 & 5 METACARPAL FRACTURES;  Surgeon: Milly Jakob, MD;  Location: [place];  Service: Orthopedics;  Laterality: Right;  . FOOT SURGERY  03/1988   cyst removed from right heel  . LAPAROSCOPIC LYSIS OF ADHESIONS  2013    RLQ pain resolved after surgery  . pelvic surgery  2003   cysto-recto-enetero cele repair     Allergies as of 06/01/2020      Reactions   Latex    Sulfonamide Derivatives Hives      Medication List       Accurate as of June 01, 2020  1:04 PM. If you have any questions, ask your nurse or doctor.        calcium carbonate 1250 MG capsule Take 1,250 mg by mouth 2 (two) times daily with a meal.   cetirizine 10 MG tablet Commonly known as: ZYRTEC Take 1 tablet (10 mg total) by mouth daily.   cyanocobalamin 1000 MCG tablet Take 100 mcg by mouth daily.   diclofenac sodium 1 % Gel Commonly known as: VOLTAREN Apply 2 g topically as needed.    diphenoxylate-atropine 2.5-0.025 MG tablet Commonly known as: LOMOTIL TAKE 1 TABLET BY MOUTH THREE TIMES DAILY AS NEEDED FOR DIARRHEA   DULoxetine 60 MG capsule Commonly known as: CYMBALTA Take 60 mg by mouth daily.   gabapentin 600 MG tablet Commonly known as: NEURONTIN Take 600 mg by mouth 3 (three) times daily.   lipase/protease/amylase 36000 UNITS Cpep capsule Commonly known as: Creon Take 2 capsules (72,000 Units total) by mouth 3 (three) times daily with meals. May also take 1 capsule (36,000 Units total) as needed (1 with snacks).   multivitamin per tablet Take 1 tablet by mouth daily.   Norco 5-325 MG tablet Generic drug: HYDROcodone-acetaminophen Take by mouth.   Nuvigil 150 MG tablet Generic drug: Armodafinil Take 150 mg by mouth daily as needed.   timolol 0.5 % ophthalmic solution Commonly known as: BETIMOL Place 1 drop into both eyes daily.   VITAMIN D-3 PO Take 1 capsule by mouth daily.          Objective:   Physical Exam Abdominal:      BP 116/70 (BP Location: Right Arm, Patient Position: Sitting, Cuff Size: Large)   Pulse 83   Temp 97.6 F (36.4 C) (Oral)  Ht 5' 3.5" (1.613 m)   Wt 128 lb 6.4 oz (58.2 kg)   SpO2 98%   BMI 22.39 kg/m  General:   Well developed, NAD, BMI noted. HEENT:  Normocephalic . Face symmetric, atraumatic Neurologic:  alert & oriented X3.  Speech normal, gait appropriate for age and unassisted Psych--  Cognition and judgment appear intact.  Cooperative with normal attention span and concentration.  Behavior appropriate. No anxious or depressed appearing.      Assessment     Assessment DM Hyperlipidemia Depression, insomnia b 12 deficiency Neuro: -H/o vertigo -Narcolepsy Dr Bertrum Sol -Neuropathy, 1998, not related to DM, workup negative, idiopathic. Dr Jannifer Franklin [facility]; hydrocodone per Dr Jannifer Franklin Chronic L ankle edema DJD   Wrist Fx 12-2014 DEXA 12-07 (-) and 7-10 normal; T score -1.8  (07-2015) GI:   HH; colon polyps;  Diverticulitis;  cholelithiasis per CT 2012 Elevated AP: Work-up negative as of 04-2020 Pancreatic insufficiency: DX 04-2020, diarrhea decreased with Creon.  PLAN: Spigelian hernia: To be seen next month by general surgery.  She would like to be seen sooner, encouraged to call the surgery office. Again encouraged to avoid Valsalva type of situations. A truss  may be a good option as well.  ER indications reviewed with the patient. Elevated LFTs, pancreatic insufficiency: Saw GI 04/28/2020, elevated A.P. with negative work-up, she also had possibly steatorrhea, pancreatic insufficiency, recommended a trial with Creon.  It has significantly reduced her diarrhea. Preventive care: Flu shot today RTC 6 months   This visit occurred during the SARS-CoV-2 public health emergency.  Safety protocols were in place, including screening questions prior to the visit, additional usage of staff PPE, and extensive cleaning of exam room while observing appropriate contact time as indicated for disinfecting solutions.

## 2020-06-02 DIAGNOSIS — K8689 Other specified diseases of pancreas: Secondary | ICD-10-CM | POA: Insufficient documentation

## 2020-06-02 NOTE — Assessment & Plan Note (Signed)
Spigelian hernia: To be seen next month by general surgery.  She would like to be seen sooner, encouraged to call the surgery office. Again encouraged to avoid Valsalva type of situations. A truss  may be a good option as well.  ER indications reviewed with the patient. Elevated LFTs, pancreatic insufficiency: Saw GI 04/28/2020, elevated A.P. with negative work-up, she also had possibly steatorrhea, pancreatic insufficiency, recommended a trial with Creon.  It has significantly reduced her diarrhea. Preventive care: Flu shot today RTC 6 months

## 2020-06-30 LAB — HM DIABETES EYE EXAM

## 2020-07-01 DIAGNOSIS — K439 Ventral hernia without obstruction or gangrene: Secondary | ICD-10-CM | POA: Diagnosis not present

## 2020-07-06 ENCOUNTER — Other Ambulatory Visit: Payer: Self-pay | Admitting: Surgery

## 2020-07-06 DIAGNOSIS — K439 Ventral hernia without obstruction or gangrene: Secondary | ICD-10-CM

## 2020-07-23 ENCOUNTER — Ambulatory Visit
Admission: RE | Admit: 2020-07-23 | Discharge: 2020-07-23 | Disposition: A | Discharge status: Home / Self Care | Payer: Medicare Other | Source: Ambulatory Visit | Attending: Surgery | Admitting: Surgery

## 2020-07-23 DIAGNOSIS — M5136 Other intervertebral disc degeneration, lumbar region: Secondary | ICD-10-CM | POA: Diagnosis not present

## 2020-07-23 DIAGNOSIS — K449 Diaphragmatic hernia without obstruction or gangrene: Secondary | ICD-10-CM | POA: Diagnosis not present

## 2020-07-23 DIAGNOSIS — I700 Atherosclerosis of aorta: Secondary | ICD-10-CM | POA: Diagnosis not present

## 2020-07-23 DIAGNOSIS — K439 Ventral hernia without obstruction or gangrene: Secondary | ICD-10-CM

## 2020-07-23 DIAGNOSIS — K5730 Diverticulosis of large intestine without perforation or abscess without bleeding: Secondary | ICD-10-CM | POA: Diagnosis not present

## 2020-07-23 DIAGNOSIS — J984 Other disorders of lung: Secondary | ICD-10-CM | POA: Diagnosis not present

## 2020-07-23 DIAGNOSIS — K3189 Other diseases of stomach and duodenum: Secondary | ICD-10-CM | POA: Diagnosis not present

## 2020-07-23 DIAGNOSIS — K8020 Calculus of gallbladder without cholecystitis without obstruction: Secondary | ICD-10-CM | POA: Diagnosis not present

## 2020-07-23 MED ORDER — IOPAMIDOL (ISOVUE-300) INJECTION 61%
100.0000 mL | Freq: Once | INTRAVENOUS | Status: AC | PRN
  Administered 2020-07-23: 100 mL via INTRAVENOUS

## 2020-08-30 ENCOUNTER — Telehealth: Payer: Self-pay | Admitting: Internal Medicine

## 2020-08-30 NOTE — Telephone Encounter (Signed)
Patient called requesting to speak with you regarding Creon application.

## 2020-09-03 NOTE — Telephone Encounter (Signed)
Spoke with patient and told her I had filled out my part of her application and that I would have Dr. Henrene Pastor sign it on Monday before I sent it back.  Patient agreed.

## 2020-09-08 NOTE — Telephone Encounter (Signed)
Faxed Creon assistance papers.  Called patient to let her know - no answer no voicemail

## 2020-09-09 DIAGNOSIS — K439 Ventral hernia without obstruction or gangrene: Secondary | ICD-10-CM | POA: Diagnosis not present

## 2020-09-09 DIAGNOSIS — K449 Diaphragmatic hernia without obstruction or gangrene: Secondary | ICD-10-CM | POA: Diagnosis not present

## 2020-09-17 DIAGNOSIS — G4719 Other hypersomnia: Secondary | ICD-10-CM | POA: Diagnosis not present

## 2020-09-17 DIAGNOSIS — G603 Idiopathic progressive neuropathy: Secondary | ICD-10-CM | POA: Diagnosis not present

## 2020-09-23 ENCOUNTER — Telehealth: Payer: Self-pay

## 2020-09-23 NOTE — Telephone Encounter (Signed)
Spoke with patient to let her know that I spoke to [employer] and her [employer] for Creon had been approved and would be shipped to her.  Patient acknowledged and understood

## 2020-09-24 ENCOUNTER — Encounter: Payer: Self-pay | Admitting: Internal Medicine

## 2020-09-24 ENCOUNTER — Other Ambulatory Visit (INDEPENDENT_AMBULATORY_CARE_PROVIDER_SITE_OTHER): Payer: Medicare Other

## 2020-09-24 ENCOUNTER — Ambulatory Visit (INDEPENDENT_AMBULATORY_CARE_PROVIDER_SITE_OTHER): Payer: Medicare Other | Admitting: Internal Medicine

## 2020-09-24 VITALS — BP 116/70 | HR 92 | Ht 63.25 in | Wt 127.4 lb

## 2020-09-24 DIAGNOSIS — R197 Diarrhea, unspecified: Secondary | ICD-10-CM

## 2020-09-24 DIAGNOSIS — K8689 Other specified diseases of pancreas: Secondary | ICD-10-CM | POA: Diagnosis not present

## 2020-09-24 DIAGNOSIS — R7989 Other specified abnormal findings of blood chemistry: Secondary | ICD-10-CM

## 2020-09-24 LAB — HEPATIC FUNCTION PANEL
ALT: 21 U/L (ref 0–35)
AST: 24 U/L (ref 0–37)
Albumin: 4.2 g/dL (ref 3.5–5.2)
Alkaline Phosphatase: 154 U/L — ABNORMAL HIGH (ref 39–117)
Bilirubin, Direct: 0.3 mg/dL (ref 0.0–0.3)
Total Bilirubin: 1.2 mg/dL (ref 0.2–1.2)
Total Protein: 6.7 g/dL (ref 6.0–8.3)

## 2020-09-24 LAB — PROTIME-INR
INR: 1 ratio (ref 0.8–1.0)
Prothrombin Time: 11.1 s (ref 9.6–13.1)

## 2020-09-24 MED ORDER — METRONIDAZOLE 250 MG PO TABS: 250.0000 mg | ORAL_TABLET | Freq: Three times a day (TID) | ORAL | 0 refills | Status: AC

## 2020-09-24 NOTE — Patient Instructions (Addendum)
It was a pleasure to see you today. Based on our discussion, I am providing you with my recommendations below:  RECOMMENDATION(S):   LABS:   . Please proceed to the basement level for lab work before leaving today. Press "B" on the elevator. The lab is located at the first door on the left as you exit the elevator.  HEALTHCARE LAWS AND MY CHART RESULTS:   . Due to recent changes in healthcare laws, you may see results of your imaging and/or laboratory studies on MyChart before I have had a chance to review them.  I understand that in some cases there may be results that are confusing or concerning to you. Please understand that not all results are received at same time and often I may need to interpret multiple results in order to provide you with the best plan of care or course of treatment. Therefore, I ask that you please give me 48 hours to thoroughly review all your results before contacting my office for clarification.   PRESCRIPTION MEDICATION(S):   We have sent the following medication(s) to your pharmacy:  . Flagyl - please take as prescribed until gone  NOTE: If your medication(s) requires a PRIOR AUTHORIZATION, we will receive notification from your pharmacy. Once received, the process to submit for approval may take up to 7-10 business days. You will be contacted about any denials we have received from your insurance company as well as alternatives recommended by your provider.  FOLLOW UP:  . I would like for you to follow up with me in 1 year. Please call the office at [phone] to schedule your appointment.  BMI:  . If you are age 55 or older, your body mass index should be between 23-30. Your Body mass index is 22.39 kg/m. If this is out of the aforementioned range listed, please consider follow up with your Primary Care Provider.  Thank you for trusting me with your gastrointestinal care!    Scarlette Shorts, MD

## 2020-09-24 NOTE — Progress Notes (Signed)
HISTORY OF PRESENT ILLNESS:  Sierra Klein is a 87 y.o. female who presents today for follow-up.  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  See that dictation.  She presents today for follow-up.  She was placed on Creon, 2 with meals.  She reports abrupt resolution of her diarrhea.  She still has stools which float.  Her weight loss has stopped.  Stable weight since her last visit.  She is pleased.  She does complain of increased intestinal gas and foul-smelling flatus.  Liver test from April 28, 2020 were normal except for alkaline phosphatase of 270.  INR 1.1.  She also has a right lower quadrant inguinal hernia for which she wears a corset.  REVIEW OF SYSTEMS:  All non-GI ROS negative except for arthritis, sinus and allergy, depression, fatigue, hearing problems, sleeping problems, ankle swelling, urinary leakage  Past Medical History:  Diagnosis Date  . B12 deficiency anemia   . Cholelithiasis   . Colon polyps   . Diabetes mellitus   . Disturbance, sleep    narcolepsy  . Diverticulitis   . Hiatal hernia   . Hyperlipidemia    started med 6/09  . 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 Osteoarthritis     Past Surgical History:  Procedure Laterality Date  . ABDOMINAL HYSTERECTOMY     no oophorectomy  . BELPHAROPTOSIS REPAIR    . BREAST BIOPSY  09/1983   left  . CATARACT EXTRACTION     l- 9/09, r- 3/10  . CLOSED REDUCTION METACARPAL WITH PERCUTANEOUS PINNING Right 01/12/2015   Procedure: PINNING RIGHT 4 & 5 METACARPAL FRACTURES;  Surgeon: Milly Jakob, MD;  Location: [facility];  Service: Orthopedics;  Laterality: Right;  . FOOT SURGERY  03/1988   cyst removed from right heel  . LAPAROSCOPIC  LYSIS OF ADHESIONS  2013    RLQ pain resolved after surgery  . pelvic surgery  2003   cysto-recto-enetero cele repair     Social History MARIONNA GONIA  reports that she quit smoking about 66 years ago. Her smoking use included cigarettes. She has never used smokeless tobacco. She reports current alcohol use of about 4.0 standard drinks of alcohol per week. She reports that she does not use drugs.  family history includes Alzheimer's disease in her mother; Coronary artery disease in her brother, mother, and sister; Diabetes in an other family member; Heart disease in her sister; Hyperlipidemia in her brother; Hypertension in her brother and brother.  Allergies  Allergen Reactions  . Latex   . Sulfonamide Derivatives Hives       PHYSICAL EXAMINATION: Vital signs: BP 116/70 (BP Location: Left Arm, Patient Position: Sitting, Cuff Size: Normal)   Pulse 92   Ht 5' 3.25" (1.607 m)   Wt 127 lb 6 oz (57.8 kg)   BMI 22.39 kg/m   Constitutional: generally well-appearing, no acute distress Psychiatric: alert and oriented x3, cooperative Eyes: extraocular movements intact, anicteric, conjunctiva pink Mouth: oral pharynx moist, no lesions Neck: supple no lymphadenopathy Cardiovascular: heart regular rate and rhythm, no murmur Lungs: clear to auscultation bilaterally Abdomen: soft, nontender, nondistended, no obvious ascites, no peritoneal signs, normal bowel sounds, no organomegaly Rectal: Omitted Extremities: no clubbing or cyanosis.  Trace lower extremity edema bilaterally Skin: no lesions  on visible extremities Neuro: No focal deficits. No asterixis.    ASSESSMENT:  1.  Chronically elevated alkaline phosphatase.  Normal liver performance.  Negative extensive work-up except for mildly elevated ANA.  No evidence for obstructive process.  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 2.  Pancreatic insufficiency.  Good response to Creon 3.  Increased foul-smelling gas.  Possibly bacterial  overgrowth secondary to pancreatic insufficiency 4.  Diverticulosis with a history of diverticulitis 5.  Incidental cholelithiasis   PLAN:  1.  Continue Creon 2 before meals.  Increase as needed.  Medication refilled. 2.  Liver tests and PT/INR today 3.  Prescribe metronidazole 250 mg 3 times daily for 10 days for possible bacterial overgrowth.  Medication risks reviewed 4.  Routine office follow-up 1 year.  Contact the office in the interim for questions or problems 5.  Ongoing general medical care with Dr. Larose Kells A total time of 30 minutes was spent preparing to see the patient, reviewing test, obtaining history, performing physical exam, counseling the patient regarding the above listed issues, ordering medications and laboratory tests, and documenting clinical information in the health record

## 2020-11-30 ENCOUNTER — Ambulatory Visit (INDEPENDENT_AMBULATORY_CARE_PROVIDER_SITE_OTHER): Payer: Medicare Other | Admitting: Internal Medicine

## 2020-11-30 ENCOUNTER — Other Ambulatory Visit: Payer: Self-pay

## 2020-11-30 ENCOUNTER — Encounter: Payer: Self-pay | Admitting: Internal Medicine

## 2020-11-30 VITALS — BP 122/64 | HR 61 | Temp 98.4°F | Resp 16 | Ht 63.0 in | Wt 134.2 lb

## 2020-11-30 DIAGNOSIS — D518 Other vitamin B12 deficiency anemias: Secondary | ICD-10-CM | POA: Diagnosis not present

## 2020-11-30 DIAGNOSIS — G4700 Insomnia, unspecified: Secondary | ICD-10-CM | POA: Diagnosis not present

## 2020-11-30 DIAGNOSIS — E1140 Type 2 diabetes mellitus with diabetic neuropathy, unspecified: Secondary | ICD-10-CM | POA: Diagnosis not present

## 2020-11-30 DIAGNOSIS — K439 Ventral hernia without obstruction or gangrene: Secondary | ICD-10-CM

## 2020-11-30 DIAGNOSIS — E785 Hyperlipidemia, unspecified: Secondary | ICD-10-CM | POA: Diagnosis not present

## 2020-11-30 NOTE — Patient Instructions (Signed)
It was always a pleasure to see you  Good luck in [place]  If your new doctor has a computer program called EPIC, he/she can see the records from my office,  if not please sign a letter and I will send them the records  [facility] : Get the blood work

## 2020-11-30 NOTE — Progress Notes (Signed)
Subjective:    Patient ID: Sierra Klein, female    DOB: 1931-12-01, 87 y.o.   MRN: [id number]  DOS:  11/30/2020 Type of visit - description: Routine visit  Today with talk about B12 deficiency, spigelian hernia, history of diabetes, high cholesterol, insomnia.  In general she feels well. Uses a truss to help with the hernia.  Denies nausea vomiting.  She is very emotional, she is moving to [place],  that means leave the house she has been living for the last ~ 40 years.  Still has some issues with insomnia  Review of Systems See above   Past Medical History:  Diagnosis Date   B12 deficiency anemia    Cholelithiasis    Colon polyps    Diabetes mellitus    Disturbance, sleep    narcolepsy   Diverticulitis    Hiatal hernia    Hyperlipidemia    started med 6/09   Neuropathy 1998   not releated to DM w/u neg (idopatic progressive neuropathy)= f/u [facility]   Osteoarthritis     Past Surgical History:  Procedure Laterality Date   ABDOMINAL HYSTERECTOMY     no oophorectomy   BELPHAROPTOSIS REPAIR     BREAST BIOPSY  09/1983   left   CATARACT EXTRACTION     l- 9/09, r- 3/10   CLOSED REDUCTION METACARPAL WITH PERCUTANEOUS PINNING Right 01/12/2015   Procedure: PINNING RIGHT 4 & 5 METACARPAL FRACTURES;  Surgeon: Milly Jakob, MD;  Location: [facility];  Service: Orthopedics;  Laterality: Right;   FOOT SURGERY  03/1988   cyst removed from right heel   LAPAROSCOPIC LYSIS OF ADHESIONS  2013    RLQ pain resolved after surgery   pelvic surgery  2003   cysto-recto-enetero cele repair     Allergies as of 11/30/2020       Reactions   Latex    Sulfonamide Derivatives Hives        Medication List        Accurate as of November 30, 2020  2:44 PM. If you have any questions, ask your nurse or doctor.          STOP taking these medications    timolol 0.5 % ophthalmic solution Commonly known as: BETIMOL Stopped by: Kathlene November, MD       TAKE  these medications    Armodafinil 150 MG tablet Take 150 mg by mouth daily as needed.   calcium carbonate 1250 MG capsule Take 1,250 mg by mouth 2 (two) times daily with a meal.   cetirizine 10 MG tablet Commonly known as: ZYRTEC Take 1 tablet (10 mg total) by mouth daily.   cyanocobalamin 1000 MCG tablet Take 100 mcg by mouth daily.   diclofenac sodium 1 % Gel Commonly known as: VOLTAREN Apply 2 g topically as needed.   DULoxetine 60 MG capsule Commonly known as: CYMBALTA Take 60 mg by mouth daily.   gabapentin 600 MG tablet Commonly known as: NEURONTIN Take 600 mg by mouth 3 (three) times daily.   HYDROcodone-acetaminophen 5-325 MG tablet Commonly known as: NORCO/VICODIN Take by mouth.   lipase/protease/amylase 36000 UNITS Cpep capsule Commonly known as: Creon Take 2 capsules (72,000 Units total) by mouth 3 (three) times daily with meals. May also take 1 capsule (36,000 Units total) as needed (1 with snacks).   multivitamin per tablet Take 1 tablet by mouth daily.   timolol 0.5 % ophthalmic solution Commonly known as: TIMOPTIC Place 1 drop into both eyes  daily.   VITAMIN D-3 PO Take 1 capsule by mouth daily.           Objective:   Physical Exam BP 122/64 (BP Location: Left Arm, Patient Position: Sitting, Cuff Size: Small)   Pulse 61   Temp 98.4 F (36.9 C) (Oral)   Resp 16   Ht 5\' 3"  (1.6 m)   Wt 134 lb 4 oz (60.9 kg)   SpO2 91%   BMI 23.78 kg/m  General:   Well developed, NAD, BMI noted.  HEENT:  Normocephalic . Face symmetric, atraumatic Lungs:  CTA B Normal respiratory effort, no intercostal retractions, no accessory muscle use. Heart: RRR,  no murmur.  Abdomen:  Not distended, soft, reducible hernia noted on the RLQ, unchanged from previous. Skin: Not pale. Not jaundice Lower extremities: no pretibial edema bilaterally  Neurologic:  alert & oriented X3.  Speech normal, gait appropriate for age and unassisted Psych--  Cognition and  judgment appear intact.  Cooperative with normal attention span and concentration.  Behavior appropriate. Anxious  at times during the visit     Assessment      Assessment DM Hyperlipidemia Depression, insomnia b 12 deficiency Neuro: -H/o vertigo -Narcolepsy Dr Bertrum Sol -Neuropathy, 1998, not related to DM, workup negative, idiopathic. Dr Jannifer [facility]; hydrocodone per Dr Jannifer Franklin Chronic L ankle edema DJD   Wrist Fx 12-2014 DEXA 12-07 (-) and 7-10 normal; T score -1.8 (07-2015) GI:   HH; colon polyps;  Diverticulitis;  cholelithiasis per CT 2012 Elevated AP: Work-up negative as of 04-2020 Pancreatic insufficiency: DX 04-2020, diarrhea decreased with Creon.  PLAN: DM: History of, diet controlled, check A1c Hyperlipidemia: Diet controlled, checking labs Depression, insomnia: Sad because she is moving to [place], leaving the house she has lived for over 40 years, managing stress okay however. Insomnia is a chronic issue, she discussed that regularly with Dr. Jannifer Franklin. I noted she has armodafinil but takes that very rarely. B12 deficiency: On OTCs, checking labs DJD: Well-controlled with the sporadic use of hydrocodone. Spigelian hernia: Saw general surgery, CT done, was recommended conservative treatment, uses a truss with good results, ER if: Severe pain, bulging, nausea vomiting. Social: Moving to [place], see AVS. COVID-vaccine: Encouraged.  #4, declined  RTC as needed     This visit occurred during the SARS-CoV-2 public health emergency.  Safety protocols were in place, including screening questions prior to the visit, additional usage of staff PPE, and extensive cleaning of exam room while observing appropriate contact time as indicated for disinfecting solutions.

## 2020-12-01 LAB — B12 AND FOLATE PANEL
Folate: >24.4 ng/mL (ref >5.9)
Vitamin B-12: 416 pg/mL (ref 211–911)

## 2020-12-01 LAB — BASIC METABOLIC PANEL
BUN: 15 mg/dL (ref 6–23)
CO2: 28 mEq/L (ref 19–32)
Calcium: 10.7 mg/dL — ABNORMAL HIGH (ref 8.4–10.5)
Chloride: 103 mEq/L (ref 96–112)
Creatinine, Ser: 0.65 mg/dL (ref 0.40–1.20)
GFR: 79.34 mL/min (ref >60.00)
Glucose, Bld: 155 mg/dL — ABNORMAL HIGH (ref 70–99)
Potassium: 4.8 mEq/L (ref 3.5–5.1)
Sodium: 138 mEq/L (ref 135–145)

## 2020-12-01 LAB — CBC WITH DIFFERENTIAL/PLATELET
Basophils Absolute: 0 10*3/uL (ref 0.0–0.1)
Basophils Relative: 0.5 % (ref 0.0–3.0)
Eosinophils Absolute: 0.1 10*3/uL (ref 0.0–0.7)
Eosinophils Relative: 1.6 % (ref 0.0–5.0)
HCT: 41.3 % (ref 36.0–46.0)
Hemoglobin: 13.2 g/dL (ref 12.0–15.0)
Lymphocytes Relative: 23.5 % (ref 12.0–46.0)
Lymphs Abs: 1.3 10*3/uL (ref 0.7–4.0)
MCHC: 32 g/dL (ref 30.0–36.0)
MCV: 91.9 fl (ref 78.0–100.0)
Monocytes Absolute: 0.5 10*3/uL (ref 0.1–1.0)
Monocytes Relative: 9.1 % (ref 3.0–12.0)
Neutro Abs: 3.7 10*3/uL (ref 1.4–7.7)
Neutrophils Relative %: 65.3 % (ref 43.0–77.0)
Platelets: 173 10*3/uL (ref 150.0–400.0)
RBC: 4.49 Mil/uL (ref 3.87–5.11)
RDW: 15.2 % (ref 11.5–15.5)
WBC: 5.6 10*3/uL (ref 4.0–10.5)

## 2020-12-01 LAB — LIPID PANEL
Cholesterol: 171 mg/dL (ref 0–200)
HDL: 48.2 mg/dL (ref >39.00)
LDL Cholesterol: 96 mg/dL (ref 0–99)
NonHDL: 122.59
Total CHOL/HDL Ratio: 4
Triglycerides: 134 mg/dL (ref 0.0–149.0)
VLDL: 26.8 mg/dL (ref 0.0–40.0)

## 2020-12-01 LAB — HEMOGLOBIN A1C: Hgb A1c MFr Bld: 6.4 % (ref 4.6–6.5)

## 2020-12-02 NOTE — Assessment & Plan Note (Signed)
DM: History of, diet controlled, check A1c Hyperlipidemia: Diet controlled, checking labs Depression, insomnia: Sad because she is moving to [place], leaving the house she has lived for over 40 years, managing stress okay however. Insomnia is a chronic issue, she discussed that regularly with Dr. Jannifer Franklin. I noted she has armodafinil but takes that very rarely. B12 deficiency: On OTCs, checking labs DJD: Well-controlled with the sporadic use of hydrocodone. Spigelian hernia: Saw general surgery, CT done, was recommended conservative treatment, uses a truss with good results, ER if: Severe pain, bulging, nausea vomiting. Social: Moving to [place], see AVS. COVID-vaccine: Encouraged.  #4, declined  RTC as needed

## 2020-12-06 ENCOUNTER — Ambulatory Visit: Payer: Medicare Other | Admitting: Internal Medicine

## 2021-03-18 IMAGING — CT CT HEAD W/O CM
3 series · 14 of 46 positions shown, 16 images · non-contrast
Comparison: 10/17/2005

CLINICAL DATA: Fall 3 days ago, head injury, headache and neck pain

EXAM:
CT HEAD WITHOUT CONTRAST
CT CERVICAL SPINE WITHOUT CONTRAST
TECHNIQUE: Multidetector CT imaging of the head and cervical spine was
performed following the standard protocol without intravenous
contrast. Multiplanar CT image reconstructions of the cervical spine
were also generated.

[Series 2: head 5.0 h30s · axial · 0.42mm/px · z∈[+80,+200]mm · 8 of 29 slices shown, 10 images]
[im 3/29  brain]
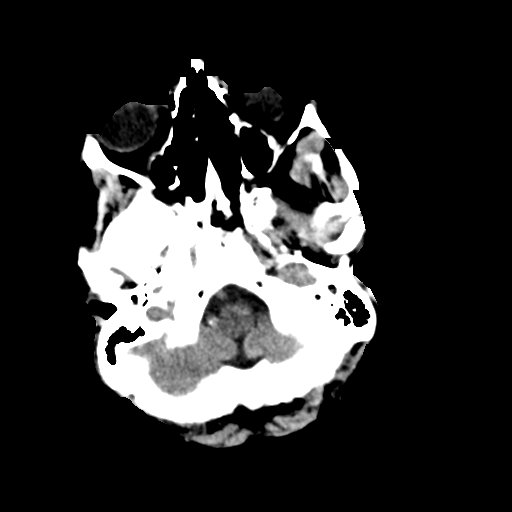
[im 3/29  bone]
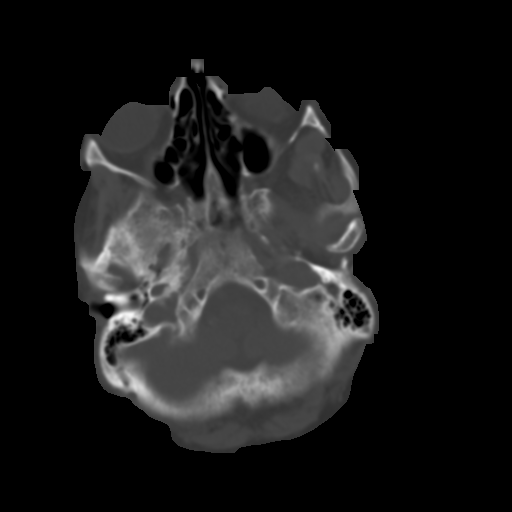
[im 7/29  brain]
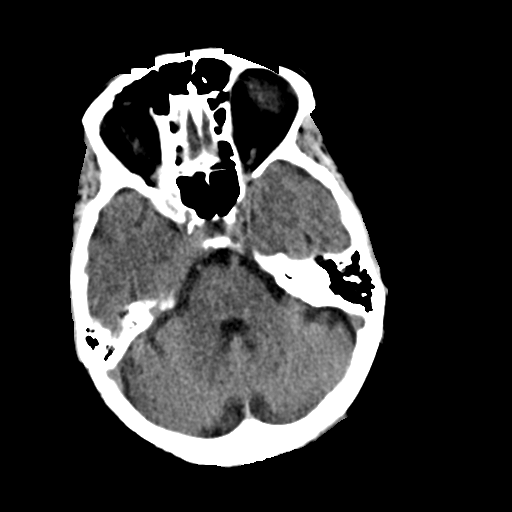
[im 10/29  brain]
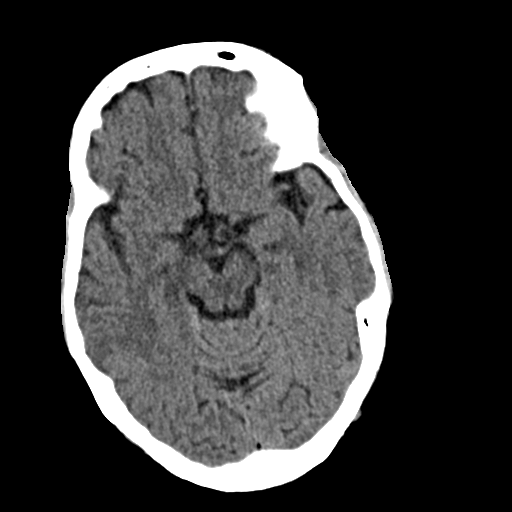
[im 13/29  brain]
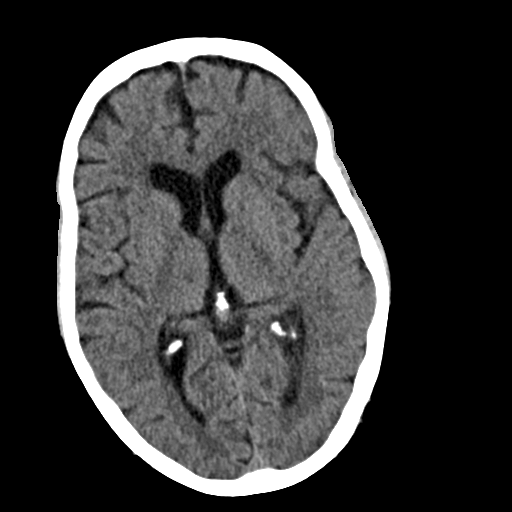
[im 17/29  brain]
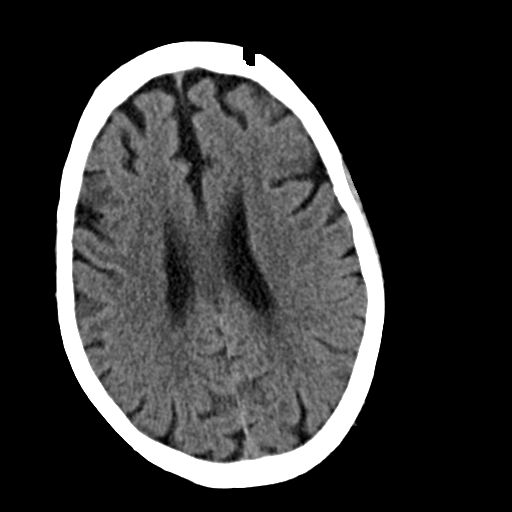
[im 17/29  bone]
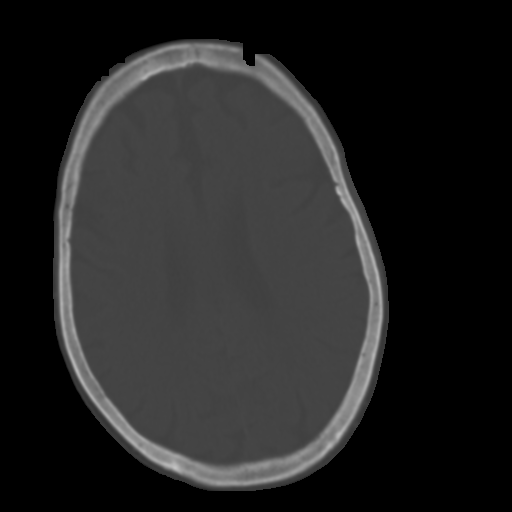
[im 20/29  brain]
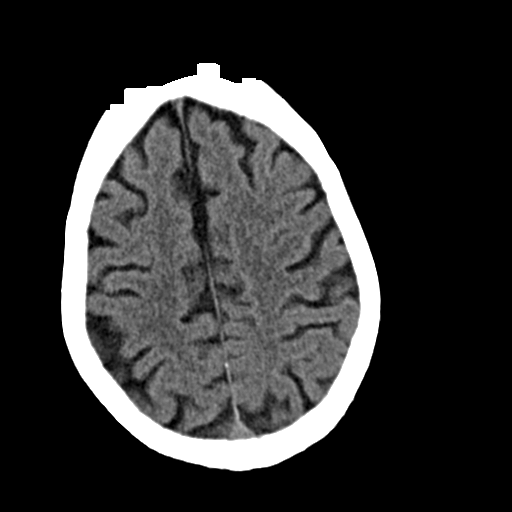
[im 23/29  brain]
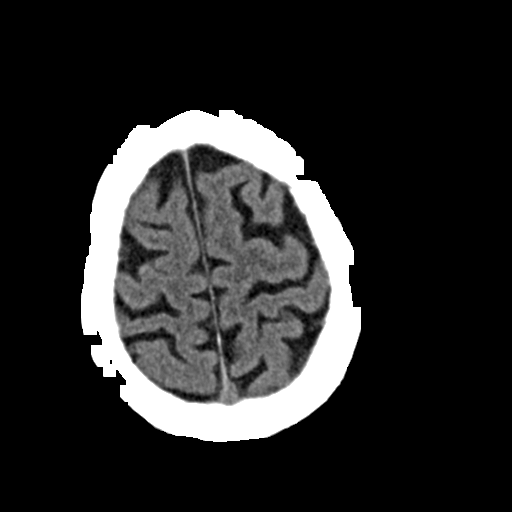
[im 27/29  brain]
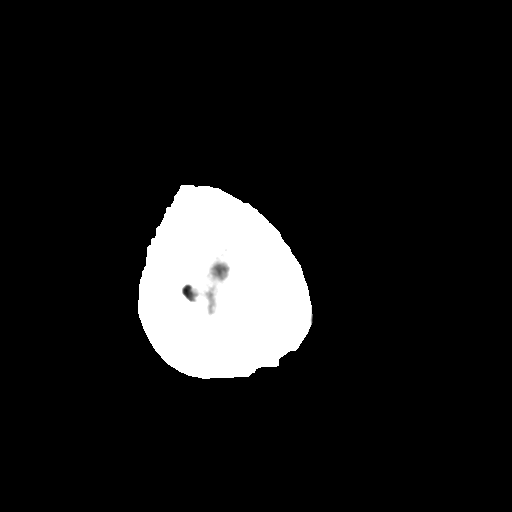

[Series 4: head 3.0 mpr cor · coronal · 0.28mm/px · 3 of 71 slices shown]
[im 24/71  brain]
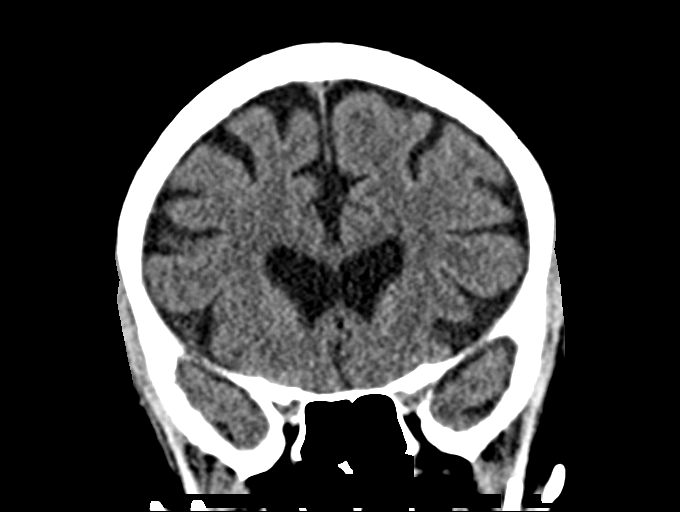
[im 32/71  brain]
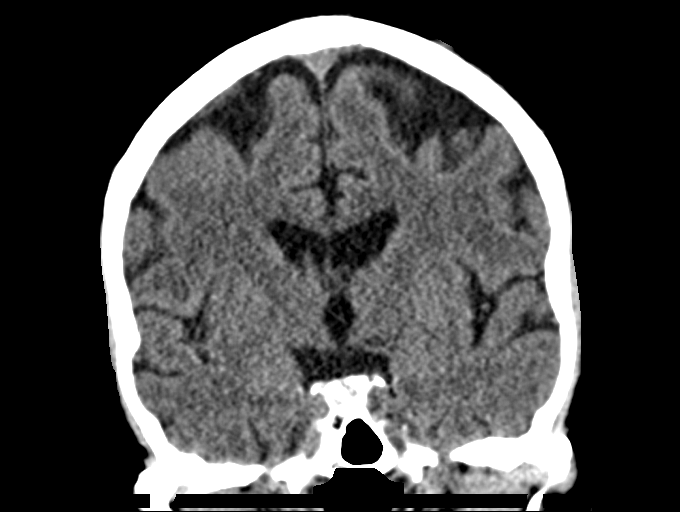
[im 39/71  brain]
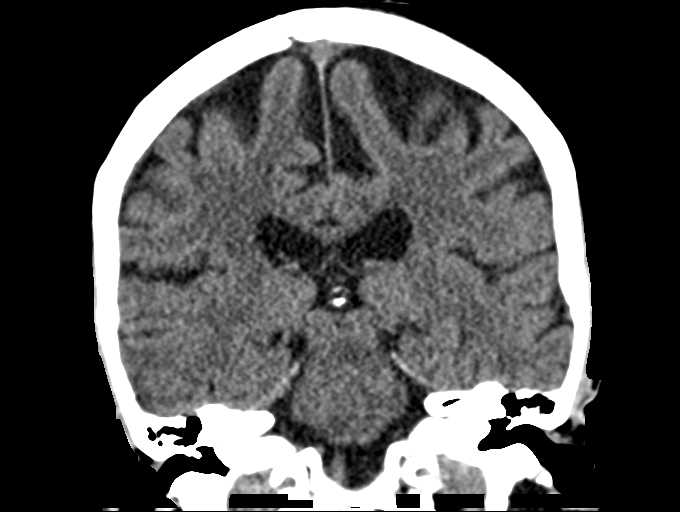

[Series 5: head 3.0 mpr sag · sagittal · 0.30mm/px · 3 of 67 slices shown]
[im 23/67  brain]
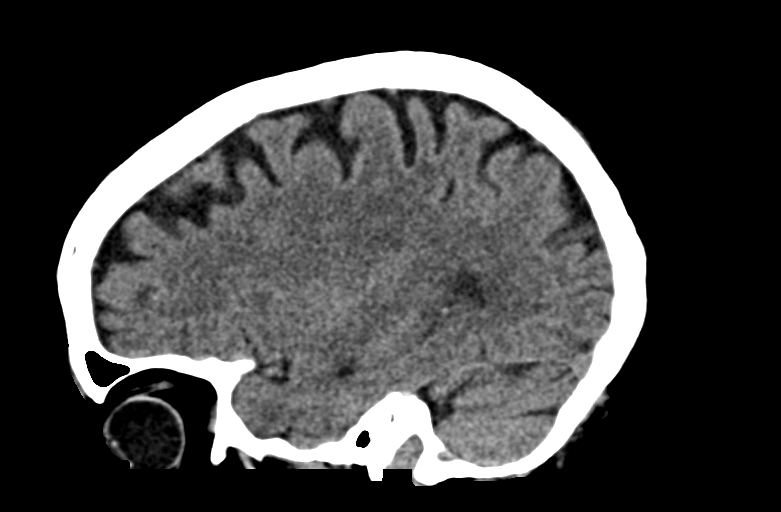
[im 34/67  brain]
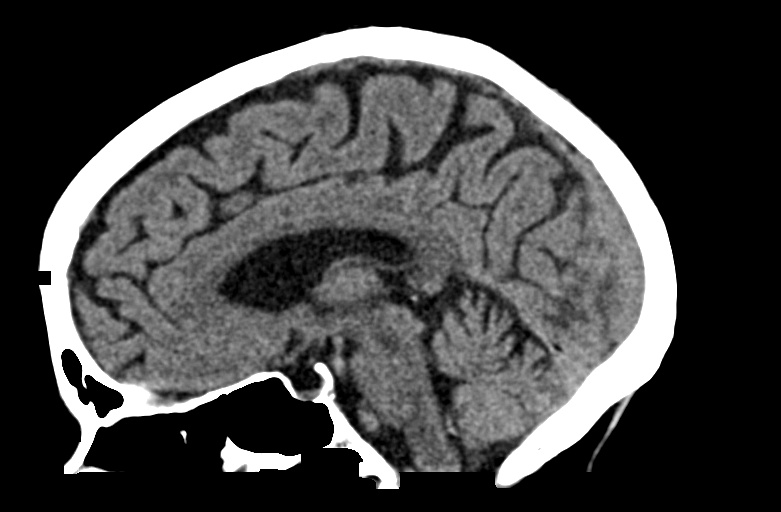
[im 45/67  brain]
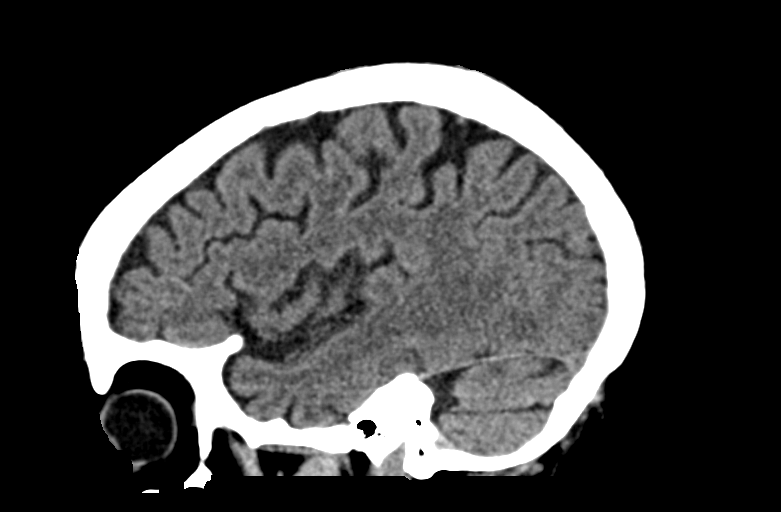

[14 of 46 positions shown; findings below may reference images not displayed]

FINDINGS: CT HEAD FINDINGS

Brain: No evidence of acute infarction, hemorrhage, hydrocephalus,
extra-axial collection or mass lesion/mass effect. Periventricular
and deep white matter hypodensity.

Vascular: No hyperdense vessel or unexpected calcification.

Skull: Normal. Negative for fracture or focal lesion.

Sinuses/Orbits: No acute finding.

Other: None.

CT CERVICAL SPINE FINDINGS

Alignment: Normal.

Skull base and vertebrae: No acute fracture. No primary bone lesion
or focal pathologic process.

Soft tissues and spinal canal: No prevertebral fluid or swelling. No
visible canal hematoma.

Disc levels: Mild to moderate multilevel disc space height loss and
osteophytosis.

Upper chest: Negative.

Other: None.
IMPRESSION: 1. No acute intracranial pathology. Small-vessel white matter
disease.
2. No fracture or static subluxation of the cervical spine

## 2021-03-22 ENCOUNTER — Encounter: Payer: Self-pay | Admitting: Internal Medicine

## 2021-04-01 DIAGNOSIS — H5202 Hypermetropia, left eye: Secondary | ICD-10-CM | POA: Diagnosis not present

## 2021-04-01 DIAGNOSIS — H5211 Myopia, right eye: Secondary | ICD-10-CM | POA: Diagnosis not present

## 2021-04-01 DIAGNOSIS — H40053 Ocular hypertension, bilateral: Secondary | ICD-10-CM | POA: Diagnosis not present

## 2021-04-01 DIAGNOSIS — Z961 Presence of intraocular lens: Secondary | ICD-10-CM | POA: Diagnosis not present

## 2021-04-01 LAB — HM DIABETES EYE EXAM

## 2021-04-04 ENCOUNTER — Encounter: Payer: Self-pay | Admitting: Internal Medicine

## 2021-04-20 DIAGNOSIS — G603 Idiopathic progressive neuropathy: Secondary | ICD-10-CM | POA: Diagnosis not present

## 2021-04-20 DIAGNOSIS — L659 Nonscarring hair loss, unspecified: Secondary | ICD-10-CM | POA: Diagnosis not present

## 2021-04-20 DIAGNOSIS — G4719 Other hypersomnia: Secondary | ICD-10-CM | POA: Diagnosis not present

## 2021-05-12 ENCOUNTER — Encounter: Payer: Self-pay | Admitting: Internal Medicine

## 2021-05-12 ENCOUNTER — Ambulatory Visit (INDEPENDENT_AMBULATORY_CARE_PROVIDER_SITE_OTHER): Payer: Medicare Other | Admitting: Internal Medicine

## 2021-05-12 VITALS — BP 160/70 | HR 64 | Ht 63.0 in | Wt 138.2 lb

## 2021-05-12 DIAGNOSIS — K8689 Other specified diseases of pancreas: Secondary | ICD-10-CM

## 2021-05-12 DIAGNOSIS — R7989 Other specified abnormal findings of blood chemistry: Secondary | ICD-10-CM

## 2021-05-12 DIAGNOSIS — R32 Unspecified urinary incontinence: Secondary | ICD-10-CM

## 2021-05-12 MED ORDER — PANCRELIPASE (LIP-PROT-AMYL) 36000-114000 UNITS PO CPEP: ORAL_CAPSULE | ORAL | 11 refills | Status: AC

## 2021-05-12 NOTE — Progress Notes (Signed)
HISTORY OF PRESENT ILLNESS:  Sierra Klein is a 87 y.o. female who presents today for follow-up.  She was last seen in this office September 24, 2020 regarding chronically elevated alkaline phosphatase and pancreatic insufficiency for which she is on Creon.  She also had issues with increased intestinal gas for which she was treated with metronidazole.  Routine follow-up in 1 year recommended.  The patient presents today with a chief complaint of urinary incontinence.  She describes urinary leakage intermittently.  Generally minor.  However, she can have more significant episodes of urinary incontinence.  She does wear protective undergarments.  She did not realize that this problem falls under the expertise of urology, not gastroenterology.  In any event, she does continue on Creon 2 with meals.  This has resolved her issues with diarrhea.  She has had good weight gain.  Approximately 10 pounds since her last visit.  She did report that metronidazole did help her foul-smelling flatulence.  However, after discontinuation of the antibiotic, the issue resume promptly.  No additional complaints.  She will be living in [place] with her son.  However, she plans to commute to [place].  Review of blood work from November 30, 2020 shows a normal CBC with hemoglobin 13.2.  Her last set of liver test September 24, 2020 were normal except for alkaline phosphatase of 154.  CT of the liver January 2022 revealed changes consistent with fatty liver.  No focal abnormalities.  Her last colonoscopy was 2012.  No neoplasia  REVIEW OF SYSTEMS:  All non-GI ROS negative unless otherwise stated in the HPI except for anxiety, fatigue  Past Medical History:  Diagnosis Date   B12 deficiency anemia    Cholelithiasis    Colon polyps    Diabetes mellitus    Disturbance, sleep    narcolepsy   Diverticulitis    Hiatal hernia    Hyperlipidemia    started med 6/09   Neuropathy 1998   not releated to DM w/u neg (idopatic progressive  neuropathy)= f/u [facility]   Osteoarthritis     Past Surgical History:  Procedure Laterality Date   ABDOMINAL HYSTERECTOMY     no oophorectomy   BELPHAROPTOSIS REPAIR     BREAST BIOPSY  09/1983   left   CATARACT EXTRACTION     l- 9/09, r- 3/10   CLOSED REDUCTION METACARPAL WITH PERCUTANEOUS PINNING Right 01/12/2015   Procedure: PINNING RIGHT 4 & 5 METACARPAL FRACTURES;  Surgeon: Milly Jakob, MD;  Location: [facility];  Service: Orthopedics;  Laterality: Right;   FOOT SURGERY  03/1988   cyst removed from right heel   LAPAROSCOPIC LYSIS OF ADHESIONS  2013    RLQ pain resolved after surgery   pelvic surgery  2003   cysto-recto-enetero cele repair     Social History Sierra Klein  reports that she quit smoking about 66 years ago. Her smoking use included cigarettes. She has never used smokeless tobacco. She reports current alcohol use of about 4.0 standard drinks per week. She reports that she does not use drugs.  family history includes Alzheimer's disease in her mother; Coronary artery disease in her brother, mother, and sister; Diabetes in an other family member; Heart disease in her sister; Hyperlipidemia in her brother; Hypertension in her brother and brother.  Allergies  Allergen Reactions   Latex    Sulfonamide Derivatives Hives       PHYSICAL EXAMINATION: Vital signs: BP (!) 160/70   Pulse 64  Ht 5\' 3"  (1.6 m)   Wt 138 lb 4 oz (62.7 kg)   BMI 24.49 kg/m   Constitutional: generally well-appearing, no acute distress Psychiatric: alert and oriented x3, cooperative Eyes: extraocular movements intact, anicteric, conjunctiva pink Mouth: oral pharynx moist, no lesions Neck: supple no lymphadenopathy Cardiovascular: heart regular rate and rhythm, no murmur Lungs: clear to auscultation bilaterally Abdomen: soft, nontender, nondistended, no obvious ascites, no peritoneal signs, normal bowel sounds, no organomegaly Rectal: Omitted Extremities:  no clubbing, cyanosis, or lower extremity edema bilaterally Skin: no lesions on visible extremities Neuro: No focal deficits. No asterixis.     ASSESSMENT:   1.  Chronically elevated alkaline phosphatase.  Normal liver performance.  Negative extensive work-up except for mildly elevated ANA.  No evidence for obstructive process.  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 2.  Pancreatic insufficiency.  Good response to Creon 3.  Increased foul-smelling gas.  Possibly bacterial overgrowth secondary to pancreatic insufficiency.  Responded to metronidazole previously.  However, response was short-lived. 4.  Diverticulosis with a history of diverticulitis 5.  Incidental cholelithiasis 6.  Urinary incontinence     PLAN:   1.  Continue Creon 2 before meals.  Increase as needed.  Medication refilled. 2.  No additional work-up regarding elevated alkaline phosphatase plan. 3.  Urology referral for urinary incontinence 4.  Routine office follow-up 1 year.  Contact the office in the interim for questions or problems 5.  Ongoing general medical care with Dr. Larose Kells A total time of 30 minutes was spent preparing to see the patient, reviewing test, obtaining history, performing physical exam, counseling the patient regarding the above listed issues, ordering medications and laboratory tests, and documenting clinical information in the health record

## 2021-05-12 NOTE — Patient Instructions (Signed)
If you are age 85 or older, your body mass index should be between 23-30. Your Body mass index is 24.49 kg/m. If this is out of the aforementioned range listed, please consider follow up with your Primary Care Provider.  If you are age 71 or younger, your body mass index should be between 19-25. Your Body mass index is 24.49 kg/m. If this is out of the aformentioned range listed, please consider follow up with your Primary Care Provider.   ________________________________________________________  The [facility] providers would like to encourage you to use [facility] to communicate with providers for non-urgent requests or questions.  Due to long hold times on the telephone, sending your provider a message by [facility] may be a faster and more efficient way to get a response.  Please allow 48 business hours for a response.  Please remember that this is for non-urgent requests.  _______________________________________________________  We have sent the following medications to your pharmacy for you to pick up at your convenience:  Creon  I will send a referral to [facility] - they will call you to set up an appointment

## 2021-05-30 ENCOUNTER — Telehealth: Payer: Self-pay | Admitting: Internal Medicine

## 2021-05-30 NOTE — Telephone Encounter (Signed)
Inbound call from patient states she will drop off paperwork for Creon to the front desk.

## 2021-06-03 NOTE — Telephone Encounter (Signed)
Noted  

## 2021-06-23 DIAGNOSIS — J101 Influenza due to other identified influenza virus with other respiratory manifestations: Secondary | ICD-10-CM | POA: Diagnosis not present

## 2021-06-28 DIAGNOSIS — R051 Acute cough: Secondary | ICD-10-CM | POA: Diagnosis not present

## 2021-06-28 DIAGNOSIS — J189 Pneumonia, unspecified organism: Secondary | ICD-10-CM | POA: Diagnosis not present

## 2021-06-28 DIAGNOSIS — J101 Influenza due to other identified influenza virus with other respiratory manifestations: Secondary | ICD-10-CM | POA: Diagnosis not present

## 2021-08-09 DIAGNOSIS — Z6822 Body mass index (BMI) 22.0-22.9, adult: Secondary | ICD-10-CM | POA: Diagnosis not present

## 2021-08-09 DIAGNOSIS — G629 Polyneuropathy, unspecified: Secondary | ICD-10-CM | POA: Diagnosis not present

## 2021-08-14 DIAGNOSIS — B029 Zoster without complications: Secondary | ICD-10-CM | POA: Diagnosis not present

## 2021-08-22 DIAGNOSIS — B0229 Other postherpetic nervous system involvement: Secondary | ICD-10-CM | POA: Diagnosis not present
